# Patient Record
Sex: Female | Born: 1935 | Race: Black or African American | Hispanic: No | Smoking: Former smoker
Health system: Southern US, Community
[De-identification: ages and names within clinical notes are randomized; demographics above are authoritative.]

## PROBLEM LIST (undated history)

## (undated) DIAGNOSIS — J449 Chronic obstructive pulmonary disease, unspecified: Secondary | ICD-10-CM

## (undated) DIAGNOSIS — F0390 Unspecified dementia without behavioral disturbance: Secondary | ICD-10-CM

## (undated) DIAGNOSIS — K219 Gastro-esophageal reflux disease without esophagitis: Secondary | ICD-10-CM

## (undated) DIAGNOSIS — I10 Essential (primary) hypertension: Secondary | ICD-10-CM

## (undated) DIAGNOSIS — E785 Hyperlipidemia, unspecified: Secondary | ICD-10-CM

## (undated) DIAGNOSIS — J45909 Unspecified asthma, uncomplicated: Secondary | ICD-10-CM

## (undated) DIAGNOSIS — M159 Polyosteoarthritis, unspecified: Secondary | ICD-10-CM

## (undated) DIAGNOSIS — F329 Major depressive disorder, single episode, unspecified: Secondary | ICD-10-CM

## (undated) DIAGNOSIS — E039 Hypothyroidism, unspecified: Secondary | ICD-10-CM

## (undated) DIAGNOSIS — D249 Benign neoplasm of unspecified breast: Secondary | ICD-10-CM

## (undated) DIAGNOSIS — R5383 Other fatigue: Secondary | ICD-10-CM

## (undated) DIAGNOSIS — Z9889 Other specified postprocedural states: Secondary | ICD-10-CM

## (undated) DIAGNOSIS — F419 Anxiety disorder, unspecified: Secondary | ICD-10-CM

## (undated) DIAGNOSIS — M549 Dorsalgia, unspecified: Secondary | ICD-10-CM

## (undated) DIAGNOSIS — N816 Rectocele: Secondary | ICD-10-CM

## (undated) DIAGNOSIS — N3281 Overactive bladder: Secondary | ICD-10-CM

## (undated) DIAGNOSIS — I119 Hypertensive heart disease without heart failure: Secondary | ICD-10-CM

## (undated) DIAGNOSIS — I2510 Atherosclerotic heart disease of native coronary artery without angina pectoris: Secondary | ICD-10-CM

## (undated) DIAGNOSIS — M1990 Unspecified osteoarthritis, unspecified site: Secondary | ICD-10-CM

## (undated) DIAGNOSIS — G4700 Insomnia, unspecified: Secondary | ICD-10-CM

## (undated) HISTORY — DX: Atherosclerotic heart disease of native coronary artery without angina pectoris: I25.10

## (undated) HISTORY — DX: Chronic obstructive pulmonary disease, unspecified: J44.9

## (undated) HISTORY — DX: Polyosteoarthritis, unspecified: M15.9

## (undated) HISTORY — DX: Gastro-esophageal reflux disease without esophagitis: K21.9

## (undated) HISTORY — DX: Major depressive disorder, single episode, unspecified: F32.9

## (undated) HISTORY — DX: Rectocele: N81.6

## (undated) HISTORY — PX: ANKLE SURGERY: SHX546

## (undated) HISTORY — DX: Essential (primary) hypertension: I10

## (undated) HISTORY — DX: Unspecified osteoarthritis, unspecified site: M19.90

## (undated) HISTORY — PX: ROTATOR CUFF REPAIR: SHX139

## (undated) HISTORY — DX: Anxiety disorder, unspecified: F41.9

## (undated) HISTORY — DX: Overactive bladder: N32.81

## (undated) HISTORY — PX: SHOULDER SURGERY: SHX246

## (undated) HISTORY — DX: Unspecified dementia, unspecified severity, without behavioral disturbance, psychotic disturbance, mood disturbance, and anxiety: F03.90

## (undated) HISTORY — DX: Benign neoplasm of unspecified breast: D24.9

## (undated) HISTORY — DX: Insomnia, unspecified: G47.00

## (undated) HISTORY — DX: Dorsalgia, unspecified: M54.9

## (undated) HISTORY — DX: Other fatigue: R53.83

## (undated) HISTORY — DX: Hyperlipidemia, unspecified: E78.5

## (undated) HISTORY — DX: Hypertensive heart disease without heart failure: I11.9

## (undated) HISTORY — PX: APPENDECTOMY: SHX54

## (undated) HISTORY — DX: Unspecified asthma, uncomplicated: J45.909

## (undated) HISTORY — DX: Hypothyroidism, unspecified: E03.9

## (undated) HISTORY — DX: Other specified postprocedural states: Z98.890

---

## 1992-11-23 HISTORY — PX: ABDOMINAL HYSTERECTOMY: SHX81

## 1994-11-23 HISTORY — PX: BREAST BIOPSY: SHX20

## 1999-05-23 ENCOUNTER — Ambulatory Visit (HOSPITAL_COMMUNITY): Admission: RE | Admit: 1999-05-23 | Discharge: 1999-05-23 | Payer: Self-pay | Admitting: Cardiology

## 2001-05-20 ENCOUNTER — Ambulatory Visit (HOSPITAL_COMMUNITY): Admission: RE | Admit: 2001-05-20 | Discharge: 2001-05-20 | Payer: Self-pay | Admitting: General Surgery

## 2001-05-20 ENCOUNTER — Encounter: Payer: Self-pay | Admitting: General Surgery

## 2001-05-25 ENCOUNTER — Encounter: Payer: Self-pay | Admitting: General Surgery

## 2001-05-25 ENCOUNTER — Ambulatory Visit (HOSPITAL_COMMUNITY): Admission: RE | Admit: 2001-05-25 | Discharge: 2001-05-25 | Payer: Self-pay | Admitting: General Surgery

## 2001-06-29 ENCOUNTER — Ambulatory Visit (HOSPITAL_COMMUNITY): Admission: RE | Admit: 2001-06-29 | Discharge: 2001-06-29 | Payer: Self-pay | Admitting: Internal Medicine

## 2001-09-26 ENCOUNTER — Emergency Department (HOSPITAL_COMMUNITY): Admission: EM | Admit: 2001-09-26 | Discharge: 2001-09-26 | Payer: Self-pay | Admitting: Emergency Medicine

## 2001-11-24 ENCOUNTER — Ambulatory Visit (HOSPITAL_COMMUNITY): Admission: RE | Admit: 2001-11-24 | Discharge: 2001-11-24 | Payer: Self-pay | Admitting: General Surgery

## 2001-11-24 ENCOUNTER — Encounter: Payer: Self-pay | Admitting: General Surgery

## 2002-02-16 ENCOUNTER — Encounter: Payer: Self-pay | Admitting: General Surgery

## 2002-02-16 ENCOUNTER — Ambulatory Visit (HOSPITAL_COMMUNITY): Admission: RE | Admit: 2002-02-16 | Discharge: 2002-02-16 | Payer: Self-pay | Admitting: General Surgery

## 2002-05-23 ENCOUNTER — Ambulatory Visit (HOSPITAL_COMMUNITY): Admission: RE | Admit: 2002-05-23 | Discharge: 2002-05-23 | Payer: Self-pay | Admitting: General Surgery

## 2002-05-23 ENCOUNTER — Encounter: Payer: Self-pay | Admitting: General Surgery

## 2003-02-19 ENCOUNTER — Emergency Department (HOSPITAL_COMMUNITY): Admission: EM | Admit: 2003-02-19 | Discharge: 2003-02-19 | Payer: Self-pay | Admitting: Internal Medicine

## 2003-02-19 ENCOUNTER — Encounter: Payer: Self-pay | Admitting: Internal Medicine

## 2003-02-27 ENCOUNTER — Ambulatory Visit (HOSPITAL_COMMUNITY): Admission: RE | Admit: 2003-02-27 | Discharge: 2003-02-27 | Payer: Self-pay | Admitting: Orthopaedic Surgery

## 2003-02-27 ENCOUNTER — Encounter: Payer: Self-pay | Admitting: Orthopaedic Surgery

## 2003-07-09 ENCOUNTER — Encounter: Payer: Self-pay | Admitting: General Surgery

## 2003-07-09 ENCOUNTER — Ambulatory Visit (HOSPITAL_COMMUNITY): Admission: RE | Admit: 2003-07-09 | Discharge: 2003-07-09 | Payer: Self-pay | Admitting: General Surgery

## 2003-08-21 ENCOUNTER — Ambulatory Visit (HOSPITAL_COMMUNITY): Admission: RE | Admit: 2003-08-21 | Discharge: 2003-08-21 | Payer: Self-pay | Admitting: Internal Medicine

## 2003-08-21 HISTORY — PX: ESOPHAGOGASTRODUODENOSCOPY: SHX1529

## 2003-09-26 ENCOUNTER — Ambulatory Visit (HOSPITAL_COMMUNITY): Admission: RE | Admit: 2003-09-26 | Discharge: 2003-09-26 | Payer: Self-pay | Admitting: Internal Medicine

## 2004-07-15 ENCOUNTER — Ambulatory Visit (HOSPITAL_COMMUNITY): Admission: RE | Admit: 2004-07-15 | Discharge: 2004-07-15 | Payer: Self-pay | Admitting: Pulmonary Disease

## 2004-08-20 ENCOUNTER — Ambulatory Visit (HOSPITAL_COMMUNITY): Admission: RE | Admit: 2004-08-20 | Discharge: 2004-08-20 | Payer: Self-pay | Admitting: Pulmonary Disease

## 2004-10-20 ENCOUNTER — Ambulatory Visit: Payer: Self-pay | Admitting: Orthopedic Surgery

## 2004-12-18 ENCOUNTER — Ambulatory Visit: Payer: Self-pay | Admitting: Orthopedic Surgery

## 2004-12-31 ENCOUNTER — Ambulatory Visit: Payer: Self-pay | Admitting: Internal Medicine

## 2005-01-14 ENCOUNTER — Ambulatory Visit: Payer: Self-pay | Admitting: Orthopedic Surgery

## 2005-02-11 ENCOUNTER — Ambulatory Visit (HOSPITAL_COMMUNITY): Admission: RE | Admit: 2005-02-11 | Discharge: 2005-02-11 | Payer: Self-pay | Admitting: General Surgery

## 2005-05-27 ENCOUNTER — Ambulatory Visit: Payer: Self-pay | Admitting: Gastroenterology

## 2005-05-27 ENCOUNTER — Inpatient Hospital Stay (HOSPITAL_COMMUNITY): Admission: EM | Admit: 2005-05-27 | Discharge: 2005-06-01 | Payer: Self-pay | Admitting: Emergency Medicine

## 2005-08-19 ENCOUNTER — Ambulatory Visit (HOSPITAL_COMMUNITY): Admission: RE | Admit: 2005-08-19 | Discharge: 2005-08-19 | Payer: Self-pay | Admitting: General Surgery

## 2005-11-02 ENCOUNTER — Ambulatory Visit (HOSPITAL_COMMUNITY): Admission: RE | Admit: 2005-11-02 | Discharge: 2005-11-02 | Payer: Self-pay | Admitting: Pulmonary Disease

## 2005-11-04 ENCOUNTER — Ambulatory Visit: Payer: Self-pay | Admitting: Orthopedic Surgery

## 2005-11-09 ENCOUNTER — Ambulatory Visit (HOSPITAL_COMMUNITY): Admission: RE | Admit: 2005-11-09 | Discharge: 2005-11-09 | Payer: Self-pay | Admitting: Orthopedic Surgery

## 2005-12-03 ENCOUNTER — Ambulatory Visit: Payer: Self-pay | Admitting: Orthopedic Surgery

## 2006-07-05 ENCOUNTER — Ambulatory Visit: Payer: Self-pay | Admitting: Orthopedic Surgery

## 2006-07-21 ENCOUNTER — Ambulatory Visit: Payer: Self-pay | Admitting: Orthopedic Surgery

## 2006-08-05 ENCOUNTER — Encounter: Admission: RE | Admit: 2006-08-05 | Discharge: 2006-08-05 | Payer: Self-pay | Admitting: Orthopedic Surgery

## 2006-08-25 ENCOUNTER — Encounter: Admission: RE | Admit: 2006-08-25 | Discharge: 2006-08-25 | Payer: Self-pay | Admitting: Orthopedic Surgery

## 2006-08-25 ENCOUNTER — Ambulatory Visit (HOSPITAL_COMMUNITY): Admission: RE | Admit: 2006-08-25 | Discharge: 2006-08-25 | Payer: Self-pay | Admitting: General Surgery

## 2006-09-15 ENCOUNTER — Encounter: Admission: RE | Admit: 2006-09-15 | Discharge: 2006-09-15 | Payer: Self-pay | Admitting: Orthopedic Surgery

## 2006-09-29 ENCOUNTER — Ambulatory Visit: Payer: Self-pay | Admitting: Orthopedic Surgery

## 2007-01-10 ENCOUNTER — Ambulatory Visit: Payer: Self-pay | Admitting: Internal Medicine

## 2007-03-08 ENCOUNTER — Ambulatory Visit: Payer: Self-pay | Admitting: Orthopedic Surgery

## 2007-04-06 ENCOUNTER — Ambulatory Visit: Payer: Self-pay | Admitting: Orthopedic Surgery

## 2007-05-05 ENCOUNTER — Ambulatory Visit: Payer: Self-pay | Admitting: Orthopedic Surgery

## 2007-05-25 ENCOUNTER — Ambulatory Visit (HOSPITAL_COMMUNITY): Admission: RE | Admit: 2007-05-25 | Discharge: 2007-05-25 | Payer: Self-pay | Admitting: Ophthalmology

## 2007-06-08 ENCOUNTER — Ambulatory Visit: Payer: Self-pay | Admitting: Orthopedic Surgery

## 2007-06-29 DIAGNOSIS — J45909 Unspecified asthma, uncomplicated: Secondary | ICD-10-CM | POA: Insufficient documentation

## 2007-07-06 ENCOUNTER — Ambulatory Visit (HOSPITAL_COMMUNITY): Admission: RE | Admit: 2007-07-06 | Discharge: 2007-07-06 | Payer: Self-pay | Admitting: Ophthalmology

## 2007-08-29 ENCOUNTER — Ambulatory Visit (HOSPITAL_COMMUNITY): Admission: RE | Admit: 2007-08-29 | Discharge: 2007-08-29 | Payer: Self-pay | Admitting: General Surgery

## 2008-03-08 ENCOUNTER — Ambulatory Visit: Payer: Self-pay | Admitting: Cardiovascular Disease

## 2008-03-14 ENCOUNTER — Ambulatory Visit: Payer: Self-pay | Admitting: Cardiovascular Disease

## 2008-03-14 ENCOUNTER — Encounter: Payer: Self-pay | Admitting: Cardiovascular Disease

## 2008-03-14 ENCOUNTER — Encounter (HOSPITAL_COMMUNITY): Admission: RE | Admit: 2008-03-14 | Discharge: 2008-04-13 | Payer: Self-pay | Admitting: Cardiovascular Disease

## 2008-08-03 ENCOUNTER — Ambulatory Visit (HOSPITAL_COMMUNITY): Admission: RE | Admit: 2008-08-03 | Discharge: 2008-08-03 | Payer: Self-pay | Admitting: Pulmonary Disease

## 2008-08-15 ENCOUNTER — Encounter: Admission: RE | Admit: 2008-08-15 | Discharge: 2008-08-15 | Payer: Self-pay | Admitting: Pulmonary Disease

## 2008-08-30 ENCOUNTER — Encounter: Admission: RE | Admit: 2008-08-30 | Discharge: 2008-08-30 | Payer: Self-pay | Admitting: Pulmonary Disease

## 2008-08-31 ENCOUNTER — Ambulatory Visit (HOSPITAL_COMMUNITY): Admission: RE | Admit: 2008-08-31 | Discharge: 2008-08-31 | Payer: Self-pay | Admitting: General Surgery

## 2008-10-29 ENCOUNTER — Encounter: Admission: RE | Admit: 2008-10-29 | Discharge: 2008-10-29 | Payer: Self-pay | Admitting: Pulmonary Disease

## 2008-11-02 ENCOUNTER — Ambulatory Visit (HOSPITAL_COMMUNITY): Admission: RE | Admit: 2008-11-02 | Discharge: 2008-11-02 | Payer: Self-pay | Admitting: Pulmonary Disease

## 2009-01-18 ENCOUNTER — Ambulatory Visit: Payer: Self-pay | Admitting: Internal Medicine

## 2009-07-08 ENCOUNTER — Encounter: Payer: Self-pay | Admitting: Internal Medicine

## 2009-08-15 ENCOUNTER — Ambulatory Visit (HOSPITAL_COMMUNITY)
Admission: RE | Admit: 2009-08-15 | Discharge: 2009-08-15 | Payer: Self-pay | Admitting: Physical Medicine and Rehabilitation

## 2009-09-02 ENCOUNTER — Ambulatory Visit (HOSPITAL_COMMUNITY): Admission: RE | Admit: 2009-09-02 | Discharge: 2009-09-02 | Payer: Self-pay | Admitting: General Surgery

## 2009-12-13 ENCOUNTER — Encounter (INDEPENDENT_AMBULATORY_CARE_PROVIDER_SITE_OTHER): Payer: Self-pay | Admitting: *Deleted

## 2010-01-01 DIAGNOSIS — Z8679 Personal history of other diseases of the circulatory system: Secondary | ICD-10-CM | POA: Insufficient documentation

## 2010-01-01 DIAGNOSIS — R1310 Dysphagia, unspecified: Secondary | ICD-10-CM | POA: Insufficient documentation

## 2010-01-01 DIAGNOSIS — R32 Unspecified urinary incontinence: Secondary | ICD-10-CM | POA: Insufficient documentation

## 2010-01-01 DIAGNOSIS — I10 Essential (primary) hypertension: Secondary | ICD-10-CM | POA: Insufficient documentation

## 2010-01-01 DIAGNOSIS — J449 Chronic obstructive pulmonary disease, unspecified: Secondary | ICD-10-CM | POA: Insufficient documentation

## 2010-01-01 DIAGNOSIS — J4489 Other specified chronic obstructive pulmonary disease: Secondary | ICD-10-CM | POA: Insufficient documentation

## 2010-01-01 DIAGNOSIS — K219 Gastro-esophageal reflux disease without esophagitis: Secondary | ICD-10-CM | POA: Insufficient documentation

## 2010-01-02 ENCOUNTER — Ambulatory Visit: Payer: Self-pay | Admitting: Internal Medicine

## 2010-01-03 DIAGNOSIS — R143 Flatulence: Secondary | ICD-10-CM

## 2010-01-03 DIAGNOSIS — R141 Gas pain: Secondary | ICD-10-CM | POA: Insufficient documentation

## 2010-01-03 DIAGNOSIS — R142 Eructation: Secondary | ICD-10-CM

## 2010-09-05 ENCOUNTER — Ambulatory Visit (HOSPITAL_COMMUNITY): Admission: RE | Admit: 2010-09-05 | Discharge: 2010-09-05 | Payer: Self-pay | Admitting: General Surgery

## 2010-12-23 ENCOUNTER — Encounter (INDEPENDENT_AMBULATORY_CARE_PROVIDER_SITE_OTHER): Payer: Self-pay | Admitting: *Deleted

## 2010-12-23 NOTE — Assessment & Plan Note (Signed)
Summary: FU OV 1 YR,GERD,FECAL SEEPAGE/AMS   Visit Type:  Follow-up Visit Primary Care Provider:  Luan Pulling  Chief Complaint:  F/U  GERD/FECAL SEEPAGE.  History of Present Illness: 77 year old lady with history of gas/bloat symptoms and GERD along with some intermittent fecal seepage returns for one-year followup. She's been taking Metamucil wafers daily to almost every day over the past one year. This is essentially abolished her fecal seepage completely.  She has good regular bowel movements daily. Occasionally, she has some mild flatulence and belching.  More recently has been on omeprazole generic 20 mg related to control of her reflux symptoms. She is having no dysphagia. No melena no rectal bleeding. She was found to have internal hemorrhoids on a 02/16/01 colonoscopy ; for routine screening 02-17-2011. She's  down to 195 pounds from 212 when she weighed here one year ago,adopting a more healthy lifestyle. She denies any intercurrent hospitalizations or illnesses she is to see Dr. Luan Pulling next week   Current Medications (verified): 1)  Hydrocodone 10/500 .... As Needed 2)  Calcium Vit D 600 Mg .... Take 1 Tablet By Mouth Once A Day 3)  Mucinex .... As Needed 4)  Duo Neb .... As Needed 5)  Advair 500/50 .... As Directed Twice Daily 6)  Albuterol 17 Grams 7)  Advair Diskus 500-50 Mcg/dose Aepb (Fluticasone-Salmeterol) .... As Directed 8)  Azor 10-40 Mg Tabs (Amlodipine-Olmesartan) .... Once Daily 9)  Clarinex 5 Mg Tabs (Desloratadine) .... Once Daily As Needed 10)  Ranitidine Hcl 150 Mg Tabs (Ranitidine Hcl) .... Two Times A Day As Needed 11)  Theophylline Cr 200 Mg Xr12h-Tab (Theophylline) .... Take 1 Tablet By Mouth Two Times A Day 12)  Pravachol 40 Mg Tabs (Pravastatin Sodium) .... Take 1 Tablet By Mouth Once A Day 13)  Ditropan Xl 10 Mg Xr24h-Tab (Oxybutynin Chloride) .... Take 1 Tablet By Mouth Once A Day 14)  Omeprazole 20 Mg Cpdr (Omeprazole) .... Take 1 Tablet By Mouth Once A Day 15)   Protegra- Antioxidant .... Take 1 Tablet By Mouth Once A Day  Allergies (verified): 1)  ! Penicillin  Past History:  Past Medical History: Last updated: 06/29/2007 Asthma copd arthritis  Past Surgical History: Last updated: 01/01/2010 shoulder (rotator cup) ankle (rod & 13 screws) HYSTERECTOMY 1990s APPENDECTOMY ROTATOR  CUFF REPAIR BENIGN BREAST BIOPSY ANKLE SURGERY  Family History: Last updated: 01/03/10 Father:Deceased age 22  Kidney Failure  Mother: Deceased in 02/17/23  TB Siblings: One brother / deceased as child/ ? pneumonia  Social History: Last updated: 2010-01-03 Marital Status: Divorced Children: 5  Occupation: Retired/Disability  Family History: Father:Deceased age 28  Kidney Failure  Mother: Deceased in Feb 17, 2023  TB Siblings: One brother / deceased as child/ ? pneumonia  Social History: Marital Status: Divorced Children: 5  Occupation: Retired/Disability  Vital Signs:  Patient profile:   77 year old female Height:      62 inches Weight:      195 pounds BMI:     35.79 Temp:     98.2 degrees F oral Pulse rate:   60 / minute BP sitting:   160 / 82  (left arm) Cuff size:   regular  Vitals Entered By: Waldon Merl LPN (February 10, [id number] 10:22 AM)  Physical Exam  General:  very pleasant 77 year old lady resting comfortably Eyes:  no scleral icterus or conjunctival are pink Abdomen:  obese positive bowel sounds soft nontender without appreciable mass or hepatosplenomegaly  Impression & Recommendations: Impression: GERD well-controlled on omeprazole. Occasional gas  bloat with dietary indiscretion. On a balance, she's doing extremely well with a fiber supplement( psyllium). . She is due for average risk screening colonoscopy 2012.  Recommendations: Continue daily Metamucil ;we'll provide her with some samples of a probiotic to combat gas bloat.  Continue omeprazole daily  Office appointment here in one year to set up screening  colonoscopy.       Appended Document: Orders Update-charge    Clinical Lists Changes  Problems: Added new problem of ABDOMINAL BLOATING (ICD-787.3) Orders: Added new Service order of Est. Patient Level IV [id number]) - Signed

## 2010-12-23 NOTE — Letter (Signed)
Summary: Appointment Reminder  [facility]  [address]   [place], [place] [address]   Phone: [phone]  Fax: [phone]       December 13, 2009   KIMERLY DUMARS [address] [place], [place]  [address] 1934-03-30    Dear Ms. Gravely,  We have been unable to reach you by phone to schedule a follow up   appointment that was recommended for you by Dr. Gala Romney. It is very   important that we reach you to schedule an appointment. We hope that you  allow [place] to participate in your health care needs. Please contact us at  [phone] at your earliest convenience to schedule your appointment.  Sincerely,    [facility] R. Garfield Cornea, M.D.    Caro Hight, M.D. Vickey Huger, FNP-BC    Neil Crouch, PA-C Phone: [phone]    Fax: [phone]

## 2010-12-31 NOTE — Letter (Signed)
Summary: Recall Office Visit  [facility]  [address]   [place], [place] [address]   Phone: [phone]  Fax: [phone]      December 23, 2010   METTE LAROSE [address] [place],   [address] February 11, 1934   Dear Ms. Lewey,   According to our records, it is time for you to schedule a follow-up office visit with [place].   At your convenience, please call [phone] to schedule an office visit. If you have any questions, concerns, or feel that this letter is in error, we would appreciate your call.   Sincerely,    [facility] Ph: [phone]   Fax: [phone]

## 2011-01-09 ENCOUNTER — Encounter (INDEPENDENT_AMBULATORY_CARE_PROVIDER_SITE_OTHER): Payer: Self-pay

## 2011-01-14 NOTE — Letter (Signed)
Summary: Recall Colonoscopy/Endoscopy, Change to Office Visit  [facility]  [address]   [place], [place] [address]   Phone: [phone]  Fax: [phone]      January 09, 2011   Lauren [name] [address] [place], [place]  [address] 03-13-1934   Dear [name],   According to our records, it is time for you to schedule a Colonoscopy/Endoscopy. However, after reviewing your medical record, we recommend an office visit in order to determine your need for a repeat procedure.  Please call [phone] at your convenience to schedule an office visit. If you have any questions or concerns, please feel free to contact our office.   Sincerely,   Waldon Merl LPN  [facility] Ph: [phone]   Fax: [phone]

## 2011-01-19 ENCOUNTER — Encounter: Payer: Self-pay | Admitting: Internal Medicine

## 2011-01-19 ENCOUNTER — Encounter: Payer: Self-pay | Admitting: Gastroenterology

## 2011-01-19 ENCOUNTER — Ambulatory Visit (INDEPENDENT_AMBULATORY_CARE_PROVIDER_SITE_OTHER): Payer: MEDICARE | Admitting: Gastroenterology

## 2011-01-19 DIAGNOSIS — Z1211 Encounter for screening for malignant neoplasm of colon: Secondary | ICD-10-CM

## 2011-01-19 DIAGNOSIS — R109 Unspecified abdominal pain: Secondary | ICD-10-CM

## 2011-01-27 ENCOUNTER — Other Ambulatory Visit: Payer: Self-pay | Admitting: Internal Medicine

## 2011-01-27 ENCOUNTER — Encounter: Payer: MEDICARE | Admitting: Internal Medicine

## 2011-01-27 ENCOUNTER — Ambulatory Visit (HOSPITAL_COMMUNITY)
Admission: RE | Admit: 2011-01-27 | Discharge: 2011-01-27 | Disposition: A | Discharge status: Home / Self Care | Payer: MEDICARE | Source: Ambulatory Visit | Attending: Internal Medicine | Admitting: Internal Medicine

## 2011-01-27 DIAGNOSIS — Z79899 Other long term (current) drug therapy: Secondary | ICD-10-CM | POA: Insufficient documentation

## 2011-01-27 DIAGNOSIS — J4489 Other specified chronic obstructive pulmonary disease: Secondary | ICD-10-CM | POA: Insufficient documentation

## 2011-01-27 DIAGNOSIS — J449 Chronic obstructive pulmonary disease, unspecified: Secondary | ICD-10-CM | POA: Insufficient documentation

## 2011-01-27 DIAGNOSIS — Z1211 Encounter for screening for malignant neoplasm of colon: Secondary | ICD-10-CM | POA: Insufficient documentation

## 2011-01-27 DIAGNOSIS — D126 Benign neoplasm of colon, unspecified: Secondary | ICD-10-CM

## 2011-01-27 DIAGNOSIS — I10 Essential (primary) hypertension: Secondary | ICD-10-CM | POA: Insufficient documentation

## 2011-01-27 DIAGNOSIS — E785 Hyperlipidemia, unspecified: Secondary | ICD-10-CM | POA: Insufficient documentation

## 2011-01-27 DIAGNOSIS — K5730 Diverticulosis of large intestine without perforation or abscess without bleeding: Secondary | ICD-10-CM | POA: Insufficient documentation

## 2011-01-27 DIAGNOSIS — I82609 Acute embolism and thrombosis of unspecified veins of unspecified upper extremity: Secondary | ICD-10-CM

## 2011-01-27 HISTORY — PX: COLONOSCOPY: SHX174

## 2011-01-28 ENCOUNTER — Encounter: Payer: Self-pay | Admitting: Internal Medicine

## 2011-01-29 NOTE — Letter (Signed)
Summary: Internal Other  Internal Other   Imported By: Sherry Ruffing 01/19/2011 12:37:56  _____________________________________________________________________  External Attachment:    Type:   Image     Comment:   External Document

## 2011-01-29 NOTE — Assessment & Plan Note (Signed)
Summary: 1 YR F/U GERD/FECAL SEEPAGE/LAW   Vital Signs:  Patient profile:   77 year old female Height:      64 inches Weight:      191 pounds BMI:     32.90 Temp:     98.5 degrees F oral Pulse rate:   64 / minute BP sitting:   152 / 72  (left arm)  Vitals Entered By: Loney Loh LPN (February 27, [id number] 10:39 AM)   Visit Type:  Follow-up Visit Primary Care Provider:  Luan Pulling   History of Present Illness: Ms. Domico is a pleasant 77 year old African-American female who presents today in f/u for a screening colonoscopy. Last seen approximately one year ago. Last TCS was in 03-02-2001: internal hemorrhoids. She has a hx of reflux as well, but denies any exacerbation. Takes omeprazole daily. No dysphagia/odynophagia. She is trying to "watch her diet", and is down 3 lbs from last visit. Denies N/V. Does report occasional lower abdominal discomfort below umbilicus after eating, described as "tearing". Unsure if r/t types of food. Lasts only a short amount of time, then resolves. No associated change in BMs. Has BM daily, no melena or hematochezia.   Current Medications (verified): 1)  Calcium Vit D 600 Mg .... Take 1 Tablet By Mouth Once A Day 2)  Mucinex .... As Needed 3)  Duo Neb .... As Needed 4)  Advair Diskus 500-50 Mcg/dose Aepb (Fluticasone-Salmeterol) .... As Directed 5)  Azor 10-40 Mg Tabs (Amlodipine-Olmesartan) .... Once Daily 6)  Ranitidine Hcl 150 Mg Tabs (Ranitidine Hcl) .... Two Times A Day As Needed 7)  Ditropan Xl 10 Mg Xr24h-Tab (Oxybutynin Chloride) .... Take 1 Tablet By Mouth Once A Day 8)  Omeprazole 20 Mg Cpdr (Omeprazole) .... Take 1 Tablet By Mouth Once A Day 9)  Protegra- Antioxidant .... Take 1 Tablet By Mouth Once A Day 10)  Livalo 2 Mg Tabs (Pitavastatin Calcium) .... Take One Qd 11)  Alprazolam 0.25 Mg Tabs (Alprazolam) .... Take One Once Daily As Needed 12)  Lyrica 25 Mg Caps (Pregabalin) .... Take One Three Times A Day 13)  Metamucil 30.9 % Powd (Psyllium) ....  Take Two Times A Day  Allergies: 1)  ! Penicillin  Past History:  Past Medical History: Last updated: 06/29/2007 Asthma copd arthritis  Past Surgical History: Last updated: 01/01/2010 shoulder (rotator cup) ankle (rod & 13 screws) HYSTERECTOMY 1990s APPENDECTOMY ROTATOR  CUFF REPAIR BENIGN BREAST BIOPSY ANKLE SURGERY  Family History: Last updated: January 17, 2010 Father:Deceased age 59  Kidney Failure  Mother: Deceased in 2023-03-03  TB Siblings: One brother / deceased as child/ ? pneumonia  Social History: Last updated: 01-17-2010 Marital Status: Divorced Children: 5  Occupation: Retired/Disability  Review of Systems General:  Denies fever, chills, and anorexia. Eyes:  Denies blurring, irritation, and discharge. ENT:  Denies sore throat, hoarseness, and difficulty swallowing. CV:  Denies chest pains and syncope. Resp:  Denies dyspnea at rest and wheezing. GI:  See HPI. GU:  Denies urinary burning and urinary frequency. MS:  Denies joint pain / LOM, joint swelling, and joint stiffness. Derm:  Denies rash, itching, and dry skin. Neuro:  Denies weakness and syncope. Psych:  Denies depression and anxiety. Endo:  Denies cold intolerance and heat intolerance.  Physical Exam  General:  Well developed, well nourished, no acute distress. Head:  Normocephalic and atraumatic. Eyes:  PERRLA, no icterus. Mouth:  No deformity or lesions, dentition normal. Lungs:  Clear throughout to auscultation. Heart:  Regular rate and rhythm; no murmurs,  rubs,  or bruits. Abdomen:  +BS, obese, soft, non-distended, no HSM noted. small, easily reducible umbilical hernia. small amt of vague right-sided abdominal tenderness to palpation. No masses noted.  Msk:  Symmetrical with no gross deformities. Normal posture. Pulses:  Normal pulses noted. Extremities:  No clubbing, cyanosis, edema or deformities noted. Neurologic:  Alert and  oriented x4;  grossly normal neurologically. Skin:  Intact  without significant lesions or rashes. Psych:  Alert and cooperative. Normal mood and affect.   Impression & Recommendations:  Problem # 1:  ABDOMINAL PAIN, UNSPECIFIED SITE (ICD-42.54)  77 year old pleasant African-American female with last colonoscopy in 2002 showing internal hemorrhoids, due for average risk screening. BM daily, no melena or hematochezia. does report vague lower abdominal discomfort like "tearing" occasionally after eating. No associated change in BM. On physical exam, no tenderness in lower abdomen, but vague right-sided tenderness to palpation. question component of constipation. doubt hepatobiliary component. Will set up for TCS with Dr. Gala Romney.   TCS with Dr. Gala Romney in near future: the R/B/A have been discussed in detail; pt states understanding and desires to proceed. Consider CT scan if TCS benign.   Orders: Est. Patient Level II [id number])  Problem # 2:  SCREENING COLORECTAL-CANCER (ICD-V76.51)  see # 1.   Orders: Est. Patient Level II [id number])   Orders Added: 1)  Est. Patient Level II [id number]

## 2011-01-29 NOTE — Letter (Signed)
Summary: TCS ORDER  TCS ORDER   Imported By: Sofie Rower 01/19/2011 12:18:34  _____________________________________________________________________  External Attachment:    Type:   Image     Comment:   External Document

## 2011-02-02 NOTE — Op Note (Signed)
  NAME:  Lauren [name], Lauren [name]              ACCOUNT NO.:  [id number]  MEDICAL RECORD NO.:  [id number]           PATIENT TYPE:  O  LOCATION:  DAYP                          FACILITY:  [facility]  PHYSICIAN:  R. Garfield Cornea, M.D. DATE OF BIRTH:  Sep 24, 1934  DATE OF PROCEDURE:  01/27/2011 DATE OF DISCHARGE:                              OPERATIVE REPORT   PROCEDURE:  Colonoscopy with polyp ablation, multiple snare polypectomies.  INDICATIONS FOR PROCEDURE:  A 77 year old lady who comes for screening colonoscopy.  Last one was approximately 10 years ago, with essentially negative findings at that time.  Risks, benefits, alternatives, limitations, and imponderables have been reviewed, questions answered. Please see the documentation in the medical record.  PROCEDURE NOTE:  O2 saturation, blood pressure, pulse, respirations were monitored throughout the entire procedure.  CONSCIOUS SEDATION:  Versed 3 mg IV, Demerol 75 mg IV in divided doses.  INSTRUMENT:  Pentax video chip system.  FINDINGS:  Digital rectal exam revealed no abnormalities.  Endoscopic findings:  Prep was adequate.  Colon:  Colonic mucosa was surveyed from the rectosigmoid junction through the left transverse right colon to the appendiceal orifice, ileocecal valve/cecum.  These structures were well seen and photographed for the record.  From this level, scope was slowly and cautiously withdrawn.  All previously mentioned mucosal surfaces were again seen.  The patient had sigmoid diverticula.  The patient had multiple 5-mm to 9-mm polyps noted throughout her ascending segment cecum, descending, and splenic flexure.  Multiple hot and cold snare polypectomies were performed, diminutive polyps in the cecum, and ascending segment were ablated with the tip of hot snare cautery unit. Multiple specimens retrieved for the pathologist.  Otherwise colonic mucosa appeared normal.  Scope was pulled down to the rectum where a thorough  examination of rectal mucosa including retroflexed view of the anal verge demonstrated no abnormalities.  The patient tolerated the procedure well.  Cecal withdrawal time 13 minutes.  IMPRESSION: 1. Normal rectum. 2. Sigmoid diverticula. 3. Multiple colonic polyps removed via snare technique and/or ablated     as described above.  RECOMMENDATIONS: 1. Diverticulosis polyp literature provided to [name]. 2. Follow up on path. 3. Further recommendations to follow.     Bridgette Habermann, M.D.     RMR/MEDQ  D:  01/27/2011  T:  01/27/2011  Job:  [id number]  cc:   Percell Miller L. Luan Pulling, M.D. [id number]  Electronically Signed by Jannette Spanner M.D. on 02/02/2011 10:27:14 AM

## 2011-02-03 NOTE — Letter (Signed)
Summary: Patient Notice, Colon Biopsy Results  [facility]  [address]   [place], [place] [address]   Phone: [phone]  Fax: [phone]       January 28, 2011   Lauren [name] [address] [place], [place]  [address] 09-19-1934    Dear Ms. Ruhland,  I am pleased to inform you that the biopsies taken during your recent colonoscopy did not show any evidence of cancer upon pathologic examination.  Additional information/recommendations:  No further action is needed at this time.  Please follow-up with your primary care physician for your other healthcare needs.  You should have a repeat colonoscopy examination  in 5 years.  Please call us if you are having persistent problems or have questions about your condition that have not been fully answered at this time.  Sincerely,    R. Garfield Cornea MD, [facility] Ph: [phone]    Fax: [phone]   Appended Document: Patient Notice, Colon Biopsy Results Letter mailed to pt.   Appended Document: Patient Notice, Colon Biopsy Results PT IS AWARE

## 2011-04-07 NOTE — Assessment & Plan Note (Signed)
Lauren [name], Lauren [name]               CHART#:  [id number]   DATE:  01/18/2009                       DOB:  02/10/1934   Follow-up gastroesophageal reflux disease.  History of diverticulosis on  2002 screening colonoscopy.  Prior barium pill esophagram demonstrates  some sluggish motility but no obstruction to passage of the barium pill.  Prior EGD back in 2004 demonstrated normal esophageal mucosa and a 54-  [place] dilator was passed empirically.  She had some nonspecific  gastric erosions at that time.  She has a new problem of intermittent  soiling of her underclothes, she does not have any out and out  incontinence but has trouble wiping after she has a bowel movement.  She  goes 1-3 times daily, tries to take a fiber fortified diet.  She does  not take a fiber supplement.  She has not had any melena or rectal  bleeding.  No family history of polyps or colon cancer.  She is due for  routine screening colonoscopy in 2012.   CURRENT MEDICATIONS:  See updated list.   ALLERGIES:  Penicillin.   EXAM:  GENERAL:  Today, looks well.  VITAL SIGNS:  Weight 212, height 5 feet 4-1/2 inches, temperature 98.3,  BP 140/90, pulse 72.  SKIN:  Warm and dry.  ABDOMEN:  Obese.  Positive bowel sounds.  Soft, nontender.  No  appreciable mass or organomegaly.  EXTREMITIES:  No edema.  RECTAL:  Digital exam reveals modest sphincter tone and really no  increase in tone with voluntary squeeze, no mass in the rectal vault,  scant brown stool is Hemoccult negative.   IMPRESSION:  1. Gastroesophageal reflux disease symptoms.  Well controlled on      Nexium 40 mg orally daily.  2. Dysphagia.  Resolved, as long as she takes time to swallow her      food.   RECOMMENDATIONS:  1. Continue Nexium 40 grams orally daily.  2. Intermittent fecal seepage.  Really, no alarm symptoms at this      time.  I would like to see her bulk up her diet significantly and      not worry about dietary intake so much  with meals.  Would just go      ahead and give her a substantial fiber supplement in the way of      psyllium (i.e., Metamucil wafers 2 daily with 8 ounces of water).      I have given her a 2-week supply of samples; this should help bulk      her up and facilitate improved complete bowel movements with less      soiling.  Hopefully this will work and she can stay on this      regimen.  She should take Metamucil every day.  If this does not      work, she is going to let me know.  Otherwise,      will plan to see this nice lady back in 1 year.  Will plan to      perform screening colonoscopy in 2012.       Bridgette Habermann, M.D.  Electronically Signed     RMR/MEDQ  D:  01/18/2009  T:  01/18/2009  Job:  [id number]   cc:   Dr. Luan Pulling

## 2011-04-07 NOTE — Assessment & Plan Note (Signed)
[name] OFFICE NOTE   Lauren [name], Lauren [name]                     MRN:          [id number]  DATE:03/08/2008                            DOB:          22-Sep-1934    Lauren [name] is a pleasant 77 year old patient referred for palpitations,  shortness of breath, fatigue and chest heaviness.  The patient has seen  Dr. Verl Blalock in the past in 2000.  She had a normal cath at that time.  She  has no documented coronary disease.  No evidence of decreased LV  function.  She has been fatigued for about a month.  She was recently  started on antibiotics for a question of a URI.  She tells me she has  not had any lab work.   CORONARY RISK FACTORS:  Include primarily hyperlipidemia.   The patient also has had a heaviness in her chest.  It is not  necessarily exertional.  It can occur in the morning when she wakes up.  There is no associated nausea or reflux.  The heaviness waxes and wanes  and has been associated with her fatigue.  She also has a history of  COPD on theophylline.  She has mild chronic exertional dyspnea and this  really has not changed.  She is fairly sedentary but does all activities  of daily living.   Her palpitations are infrequent.  She can notice them maybe once or  twice a week and they are not long runs and she has had no syncope.   She has been on her Crestor for quite some time and does not relate this  to any muscle pain or weakness.   REVIEW OF SYSTEMS:  Otherwise are negative.   PAST MEDICAL HISTORY:  Is remarkable for COPD, hyperlipidemia, recent  treatment with cefuroxime for URI, history of reflux on Nexium,  hypertension on Azor and previous hysterectomy, previous appendectomy,  previous right ankle surgery, previous left rotator cuff surgery,  previous right breast biopsy for cyst and question of a left-sided cyst  on the abdominal area.   ALLERGIES:  The patient denies any  allergies.   MEDICATIONS:  1. Her meds include Crestor 10 a day.  2. Xyzal 5 mg daily at bedtime.  3. Cefuroxime 250 b.i.d.  4. Nexium 40 a day.  5. Ranitidine 150 b.i.d.  6. Theophylline 200 b.i.d.  7. Azor 5/40.  8. Clarinex.  9. Glucosamine.  10.Advair 500/50 b.i.d.   FAMILY HISTORY:  Is noncontributory.   SOCIAL HISTORY:  She has been divorced for a long time.  She has 3 kids  who live in [place].  She sees her sister-in-law a lot.  She likes to  cook and is otherwise sedentary.  She does all activities of daily  living, does not drink or smoke.   PHYSICAL EXAMINATION:  GENERAL:  Her exam is remarkable for an elderly  black female in no distress.  VITAL SIGNS:  Her blood pressure is 130/82, pulse is 60 with occasional  PACs, respiratory rate 14, afebrile.  HEENT:  Unremarkable.  Carotids are normal without bruit.  No  lymphadenopathy,  no thyromegaly, no JVP elevation.  LUNGS:  Clear with good diaphragmatic motion and no wheezing.  HEART:  S1-S2 normal heart sounds.  PMI normal.  ABDOMEN:  Benign, status post hysterectomy and appendectomy.  No AAA, no  tenderness.  No bruit.  No hepatosplenomegaly or hepatojugular reflux.  Distal pulses intact.  No edema.  NEURO:  Nonfocal.  SKIN:  Warm and dry.  No muscular weakness.  She has scars over the  lateral aspect of the right ankle and in the left rotator cuff area.   EKG shows sinus rhythm with occasional PACs.   IMPRESSION:  1. Fatigue.  Needs basic lab work.  Will check BMET, fasting lipid      profile, LFTs, sed rate, aminophylline level, TSH and T4.  Doubt      that it is related to heart.  2. Dyspnea, likely more related to her COPD (chronic obstructive      pulmonary disease) on aminophylline.  Baseline PFTs per Dr.      Luan Pulling, check 2-D echocardiogram, no obvious murmur.  3. PACs (premature atrial contractions) with relative bradycardia.  I      do not think this is bad enough to relate to her symptoms.  At  some      point in the future she may need an outpatient monitor, avoid beta-      blockers for now.  4. Hypertension, currently well-controlled.  Continue Azor.  5. Hyperlipidemia on Crestor 10 a day.  Will check her lab work today.  6. COPD (chronic obstructive pulmonary disease).  Continue Advair and      theophylline.  Follow up with Dr. Luan Pulling.  7. Chest heaviness, may be more related to reflux.  Continue Nexium.      Will have an adenosine Myoview study since it has been quite some      time since she has had any evaluation of coronary disease.  Further      recommendations will be based on the results of      her Myoview and echo but I do not see that there is a cardiac      etiology to her multiple symptoms.     Wallis Bamberg. Johnsie Cancel, MD, [facility]  Electronically Signed    PCN/MedQ  DD: 03/08/2008  DT: 03/08/2008  Job #: [id number]   cc:   Percell Miller L. Luan Pulling, M.D.

## 2011-04-10 NOTE — H&P (Signed)
NAMETAUNDRA, LEGLEITER              ACCOUNT NO.:  [id number]   MEDICAL RECORD NO.:  [id number]          PATIENT TYPE:  INP   LOCATION:  [id number]                          FACILITY:  [facility]   PHYSICIAN:  Edward L. Luan Pulling, M.D.DATE OF BIRTH:  October 06, 1934   DATE OF ADMISSION:  05/27/2005  DATE OF DISCHARGE:  LH                                HISTORY & PHYSICAL   ADDENDUM:   PAST MEDICAL HISTORY:  Positive for coronary disease for which she has very  significantly atypical symptoms. She has hypertension, severe COPD.   SOCIAL HISTORY:  She is an ex-cigarette smoker. She lives at home alone. She  does not drink any alcohol.   FAMILY HISTORY:  Positive for COPD as well in multiple family members.       ELH/MEDQ  D:  05/29/2005  T:  05/29/2005  Job:  [id number]

## 2011-04-10 NOTE — Group Therapy Note (Signed)
NAMEEUNIQUA, KALISTA              ACCOUNT NO.:  [id number]   MEDICAL RECORD NO.:  [id number]          PATIENT TYPE:  INP   LOCATION:  A331                           FACILITY:   PHYSICIAN:  Estill Bamberg. Karie Kirks, M.D.DATE OF BIRTH:  1934/07/10   DATE OF PROCEDURE:  05/31/2005  DATE OF DISCHARGE:                                   PROGRESS NOTE   SUBJECTIVE:  She is feeling better.  She is still having a little bit of  diarrhea but this has improved and she has minimal lower abdominal pain.  She has not eaten a full meal yet, however.   OBJECTIVE:  She is sitting up in bed, having her breathing treatment.  She  is oriented, alert, in no acute distress.  A well-developed, well-nourished  female.  Temperature 98.4, pulse 60, respiratory rate 20, blood pressure  157/81.  The lungs appear clear throughout.  The heart has a regular rhythm  with a rate of 70.  The abdomen is soft, mildly overweight without  hepatosplenomegaly or mass but some mild lower abdominal tenderness.  There  is no edema of the ankles.   O2 saturation is 94% on room air.   ASSESSMENT:  1.  A three weeks history of diarrhea probably secondary to Clostridium      difficile despite negative stool tests.  2.  Chronic obstructive pulmonary disease.   PLAN:  1.  Continue the metronidazole 250 mg q.i.d.  2.  If she tolerates food,  possibly will be able to go home tomorrow.       SDK/MEDQ  D:  05/31/2005  T:  05/31/2005  Job:  [id number]

## 2011-04-10 NOTE — H&P (Signed)
NAME:  Lauren [name], Lauren [name] NO.:  [id number]   MEDICAL RECORD NO.:  [id number]                  PATIENT TYPE:   LOCATION:                                       FACILITY:   PHYSICIAN:  R. Garfield Cornea, M.D.              DATE OF BIRTH:   DATE OF ADMISSION:  DATE OF DISCHARGE:                                HISTORY & PHYSICAL   CHIEF COMPLAINT:  Dysphasia for two-three months.   SUBJECTIVE:  Ms. Lauren [name] is a 77 year old African American female who  presents to our office complaining of both liquid and solid food dysphasia  for approximately three months.  She is also complaining of odynophagia and  feeling like trying to get things down with difficulty.  She has had a  history of many years of GERD.  She currently has nausea.  However, she  denies any emesis or regurgitation.  She denies any abdominal pain, melena,  history of peptic ulcer disease, or bright red blood per rectum.  She has  been on a Napro-Pack for approximately three months.  Her weight has  remained stable and there is no family history of colorectal carcinoma.   PAST MEDICAL HISTORY:  1. COPD.  2. Hypertension.  3. Asthma.  4. Urinary incontinence.  5. Angina.  6. Hysterectomy (1990s).  7. Appendectomy.  8. Rotator cuff repair.  9. Benign breast biopsy.  10.      Ankle surgery.   CURRENT MEDICATIONS:  1. Advair Diskus b.i.d.  2. Theo-Dur 300 mg b.i.d.  3. Lescol XL 80 mg daily.  4. Detrol LA 4 mg daily.  5. Ogen 1.25 mg daily.  6. Isoptin SR 240 mg daily.  7. Napro-Pack p.r.n.  8. DuoNeb p.r.n.  9. Guaifenesin p.r.n.   ALLERGIES:  No known drug allergies.   FAMILY HISTORY:  There is no known family history of colorectal carcinoma,  liver carcinoma, or GI problems.  Mother is deceased in her 42s secondary to  tuberculosis and father deceased at age 80 secondary to kidney failure.  There is a family history of diabetes mellitus.   SOCIAL HISTORY:  She is  currently divorced and lives alone.  She has three  grown daughters and one grown son with good heath except hypertension.  She  is currently retired from a distribution center.  She does report a one pack  per day smoking history for 20 years, which approximately quit 30+ years  ago.  She denies any alcohol or drug use.   REVIEW OF SYSTEMS:  CONSTITUTIONAL:  Weight is up 5-10 pounds in the last  month secondary to increased caloric intake per patient.  She denies any  fever or chills.  SKIN:  She denies any jaundice or rash.  CARDIOVASCULAR:  She does report some substernal pain and does have a history of angina.  She  is followed by Dr. Verl Blalock in cardiology.  She does  report that the pain  discussed in HPI is in no way similar to her anginal pains.  HEENT:  She  does report some vocal changes over the past several months and decreased  strength in her voice.  PULMONARY:  She denies any shortness of breath or  cough.  EXTREMITIES:  She denies any swelling.   PHYSICAL EXAMINATION:  VITAL SIGNS:  Weight is 206.5 pounds, height 65  inches, temperature 97.7, blood pressure 140/88, pulse 76.  GENERAL:  Lauren [name] is a 77 year old African American well-developed well-  nourished female in no acute distress.  HEENT:  Sclerae clear, nonicteric.  Conjunctivae pink.  Oropharynx pink and  moist without any lesions.  NECK:  Supple.  No thyromegaly or mass.  HEART:  Regular rate and rhythm without murmurs, rubs, or gallops.  LUNGS:  Emphysematous changes bilaterally.  ABDOMEN:  Positive right lower quadrant scarring well healed secondary to  appendectomy.  Also hysterectomy scars are noted.  Positive bowel sounds in  all four.  Nontender, nondistended.  No organomegaly.  No rebound  tenderness.  Negative Murphy's sign.  EXTREMITIES:  2+ pedal pulses bilaterally.  No pedal edema.  SKIN:  Warm, dry, and intact without any rash or jaundice.   ASSESSMENT:  Lauren [name] is 77 year old female with both  solid and liquid  food dysphasia for approximately three months:  I discussed and felt it  would be advisable or proceed with esophagogastroduodenoscopy with possible  esophageal dilation as necessary, as she may have developed stricture given  her chronic gastroesophageal reflux disease.  Further evaluation to rule out  lesions is advisable as well, although this does not seem likely given her  history.  I discussed esophagogastroduodenoscopy procedure as well as the  risks and benefits to include but not limited to bleeding, perforation, and  infection.  She agrees with this plan.  Consent will be obtained.   RECOMMENDATIONS:  1. She is to continue Prevacid 30 mg daily.  2.     I will scheduled EGD with possible ED by Dr. Gala Romney.  3. We will follow up pending these results.   We would like to thank Dr. Luan Pulling for this kind referral.     _____________________________________  ___________________________________________  Les Pou, N.P.               Bridgette Habermann, M.D.   KC/MEDQ  D:  08/17/2003  T:  08/17/2003  Job:  [id number]   cc:   Percell Miller L. Luan Pulling, M.D.  [place]  [place] [address]  Fax: [phone]

## 2011-04-10 NOTE — Group Therapy Note (Signed)
Lauren [name], Lauren [name]              ACCOUNT NO.:  [id number]   MEDICAL RECORD NO.:  [id number]          PATIENT TYPE:  INP   LOCATION:  [id number]                          FACILITY:  [facility]   PHYSICIAN:  Edward L. Luan Pulling, M.D.DATE OF BIRTH:  28-May-1934   DATE OF PROCEDURE:  05/29/2005  DATE OF DISCHARGE:                                   PROGRESS NOTE   PROBLEM:  Abdominal discomfort, nausea, vomiting, diarrhea.   SUBJECTIVE:  Lauren [name] seems to be still having problems. Yesterday, she  appeared to be getting better, but this morning she is having more  discomfort in her abdomen. She is having more diarrhea.   OBJECTIVE:  VITAL SIGNS:  Her physical examination shows that her  temperature is 98.5, pulse 66, respirations 20, blood pressure 105/62, O2  saturation is 97%.  CHEST:  Her chest is actually fairly clear.  HEART:  Heart is regular.  ABDOMEN:  Her abdomen is soft.  EXTREMITIES:  Extremities showed no edema.   ASSESSMENT/PLAN:  Her clostridium difficile is negative, but she certainly  continues to have difficulty. No white blood cells in her stool. I have told  her I think I will go ahead and ask for help from the GI team simply to see  if there is anything else we can do to make her more comfortable. With  clostridium difficile being negative, this may simply be a severe  gastroenteritis.       ELH/MEDQ  D:  05/29/2005  T:  05/29/2005  Job:  [id number]

## 2011-04-10 NOTE — Discharge Summary (Signed)
[name], Lauren              ACCOUNT NO.:  [id number]   MEDICAL RECORD NO.:  [id number]          PATIENT TYPE:  INP   LOCATION:  [id number]                          FACILITY:  [facility]   PHYSICIAN:  Edward L. Luan Pulling, M.D.DATE OF BIRTH:  10/31/1934   DATE OF ADMISSION:  05/27/2005  DATE OF DISCHARGE:  07/10/2006LH                                 DISCHARGE SUMMARY   FINAL DISCHARGE DIAGNOSES:  1.  Acute diarrheal illness probably pseudomembranous colitis.  2.  Chronic obstructive pulmonary disease.  3.  Hypertension.  4.  Coronary artery occlusive disease.  5.  Dehydration.   HISTORY:  [name] is a 77 year old who was admitted to the hospital on  July 5 with a 36-hour history of nausea, vomiting, and abdominal pain.  She  had diarrhea.  She had been treated with an antibiotic for an upper  respiratory illness about 2 or 3 weeks ago.  When she was seen in the  emergency room she had low-grade fever but then it came up to 103, she had  white blood count of 20,000, her abdomen was minimally tender.  She had  mildly diffuse tenderness.  The rest of her exam showed that she appeared to  be mildly dehydrated and she was febrile as mentioned, chest was clear,  heart was regular, abdomen soft, extremities showed no edema.   HOSPITAL COURSE:  It was thought initially that she might have a viral  infection but with an elevated white blood cell count and a fever she was  worked up for Clostridium difficile.  Workup was negative as far as stools  were concerned but because she did not improve she had a consultation with  the GI team who felt that even with the negative stool this was likely  Clostridium difficile disease.  She was treated with Flagyl and improved  over the next several days, was discharged home in much improved condition  to continue with her previous medications and add Flagyl 250 mg t.i.d. x7  days and then re-evaluate.  She has been on Advair 250/50.  She is going to  continue Advair 250/50 one puff b.i.d., Theo-Dur 200 mg b.i.d., Lescol XL 80  daily, Detrol LA daily, Prevacid 30 mg daily, nebulizer with albuterol and  Atrovent four times a day and verapamil 240 mg daily.       ELH/MEDQ  D:  06/01/2005  T:  06/01/2005  Job:  [id number]

## 2011-04-10 NOTE — Group Therapy Note (Signed)
Lauren [name], Lauren [name]              ACCOUNT NO.:  [id number]   MEDICAL RECORD NO.:  [id number]          PATIENT TYPE:  INP   LOCATION:  A331                          FACILITY:  [facility]   PHYSICIAN:  Estill Bamberg. Karie Kirks, M.D.DATE OF BIRTH:  [birth year]   DATE OF PROCEDURE:  05/30/2005  DATE OF DISCHARGE:                                   PROGRESS NOTE   SUBJECTIVE:  The patient is feeling better. Diarrhea has improved somewhat.   OBJECTIVE:  GENERAL:  She is sitting up in bed. She is oriented, alert, well  developed, somewhat overweight in no acute distress.  LUNGS:  Her lungs are clear throughout.  HEART:  Regular rhythm and rate of about 70.  ABDOMEN:  Soft without hepatosplenomegaly or mass or tenderness today.  VITAL SIGNS:  O2 saturation on room air is 93 and 94. Temperature is 98.4,  pulse 61, respiratory rate 20, blood pressure 110/64.   ASSESSMENT:  1.  Chronic diarrhea which may be due to a clostridium difficile despite a      negative lab test for this.  2.  Chronic obstructive pulmonary disease.  3.  Remote history of cigarette smoking.   PLAN:  Reviewed her records. Continue her albuterol ipratropium nebulizer  treatments with metronidazole 250 mg q.i.d. and enoxaparin 40 mg q.d. subcu.  Discussed medical problems with patient and her daughter. Follow up with Dr.  Laural Golden of GI today.       SDK/MEDQ  D:  05/30/2005  T:  05/30/2005  Job:  [id number]

## 2011-04-10 NOTE — H&P (Signed)
NAMECAMRY, CASTLEMAN              ACCOUNT NO.:  [id number]   MEDICAL RECORD NO.:  [id number]          PATIENT TYPE:  INP   LOCATION:  [id number]                          FACILITY:  [facility]   PHYSICIAN:  Edward L. Luan Pulling, M.D.DATE OF BIRTH:  Dec 02, 1933   DATE OF ADMISSION:  05/27/2005  DATE OF DISCHARGE:  LH                                HISTORY & PHYSICAL   HISTORY OF PRESENT ILLNESS:  Ms. Phoebus is admitted nausea, vomiting,  diarrhea.  She says this started about 36 hours prior to admission and at  that time she had abdominal pain, which was cramping in nature.  She then  developed nausea, vomited multiple times, had diarrhea multiple times. She  did have some fever.  She had been in my office about two weeks ago and had  been placed on an antibiotic so there is a concern that this could represent  Clostridium difficile.  She came to the emergency room and was evaluated and  treated but was not able to be discharged because of increasing problems  with continued nausea, vomiting, diarrhea.   PHYSICAL EXAMINATION:  GENERAL APPEARANCE:  Her examination shows a well-  nourished, well-developed female who is in no acute distress.  She does seem  to be in some mild abdominal discomfort.  VITAL SIGNS:  Blood pressure 130/70. Pulse 80.  Temperature is 100.  CHEST:  Fairly clear.  HEART:  Regular.  ABDOMEN:  Soft.  Shows mild tenderness diffusely and hyperactive bowel  sounds.   ASSESSMENT:  Abdominal discomfort of unknown cause, possibly a Clostridium  difficile.   PLAN:  Will go ahead with treatment, medications, etc. and check stools for  Clostridium difficile.  Continue other medications and follow her.       ELH/MEDQ  D:  05/28/2005  T:  05/28/2005  Job:  [id number]

## 2011-04-10 NOTE — Consult Note (Signed)
NAMETIMECA, VALIDO              ACCOUNT NO.:  [id number]   MEDICAL RECORD NO.:  [id number]          PATIENT TYPE:  INP   LOCATION:  [id number]                          FACILITY:  [facility]   PHYSICIAN:  Hildred Laser, M.D.    DATE OF BIRTH:  08/05/1934   DATE OF CONSULTATION:  DATE OF DISCHARGE:                                   CONSULTATION   DATE OF CONSULTATION:  May 29, 2005.   REQUESTING PHYSICIAN:  Edward L. Luan Pulling, M.D.   REASON FOR CONSULTATION:  Nausea, vomiting, diarrhea, abdominal pain.   HISTORY OF PRESENT ILLNESS:  The patient is a 77 year old black female who  was admitted to the hospital on May 27, 2005 for an approximately 36-hour  history of nausea, vomiting and diarrhea.  It is notable that she had been  on antibiotics for a respiratory illness approximately 2-3 weeks ago.  Upon  presentation, she had a low-grade temperature.  The temperature rose to 103  early in her admission.  Her white count was 20,000 with 95% segmented  neutrophils.  She was started on IV Cipro by the on-call physician.  Thus  far she has had one negative C. diff toxin titer and negative fecal WBC.  Her blood cultures remained negative at 24 hours.  She has not had any noted  improvement and continues to have watery stools.  She is having four to five  a day.  Nausea and vomiting have ceased.  She has now developed lower  abdominal pain over the last 24 hours.  She is currently afebrile.   MEDICATIONS PRIOR TO ADMISSION:  The patient states she is on Advair, Theo-  Dur, Lescol, Detrol LA, a blood pressure pill, possibly Prevacid or other  PPI, and nebulizers.   ALLERGIES:  No known drug allergies.   PAST MEDICAL HISTORY:  COPD, hypertension, asthma, urinary incontinence,  angina, status post hysterectomy in the 1990s, appendectomy, rotator cuff  repair, benign breast biopsy and ankle surgery.  EGD in September, 2004  revealed multiple linear antral erosions.  Esophagus was dilated with 54  French Maloney dilator, given history of dysphagia.   FAMILY HISTORY:  Negative for colorectal cancer, liver disease or GI  problems.  The mother died in her 49s due to TB.  The father died in his 97s  secondary to kidney failure.   SOCIAL HISTORY:  She is currently divorced and lives alone.  She has three  granddaughters and one grandson.  She is retired from the distribution  center.  She reports one pack per day smoking history for 20 years but quit  30 years ago.  No alcohol or drug use.   REVIEW OF SYSTEMS:  See HPI for GI.  Denies any hematemesis, melena or  rectal bleeding.  CONSTITUTIONAL:  Denies any weight loss.  CARDIOPULMONARY:  Denies chest pain or shortness of breath.   PHYSICAL EXAMINATION:  VITAL SIGNS:  T max over the last 24 hours 99, T  currently 98.5, pulse 66, respirations 20, blood pressure 105/62.  GENERAL:  Pleasant, obese, elderly black female who appears to be in mild  distress.  SKIN:  Warm and dry, no jaundice.  HEENT:  Conjunctivae are pink.  Sclerae are nonicteric.  Oropharyngeal  mucosa moist and pink.  No lesions, erythema or exudate.  No lymphadenopathy  or thyromegaly.  CHEST:  Lungs are clear to auscultation.  CARDIAC:  Reveals regular rate and rhythm.  No murmurs, rubs or gallops.  ABDOMEN:  Positive bowel sounds.  Soft, nondistended.  She has diffuse  abdominal tenderness, particularly on the left abdomen to deep palpation.  No organomegaly or masses are appreciated but limited due to the patient's  cooperation.  No rebound tenderness noted.  Subjective guarding.  EXTREMITIES:  No edema.   LABORATORY:  White count 20,100, hemoglobin 14, hematocrit 41.3, platelets  186,000.  Sodium 134, potassium 3.3, BUN 20, creatinine 1.4, glucose 182.  That is all on May 27, 2005.  She had an abdominal film which revealed no  bowel obstruction.   IMPRESSION:  The patient is a 77 year old female who was admitted with  nausea, vomiting, diarrhea, abdominal  pain, fever, leukocytosis.  Given  recent antibiotic use, there is high suspicion for C. diff colitis.   RECOMMENDATIONS:  1.  We will discontinue Cipro after current dose.  2.  Begin Flagyl 250 mg p.o. q.i.d.  3.  Follow up with CBC, Met 7 and CT.  4.  Clear liquid diet for now.  5.  Further recommendations to follow.       LL/MEDQ  D:  05/29/2005  T:  05/29/2005  Job:  [id number]   cc:   Percell Miller L. Luan Pulling, M.D.  [place]  [place] [address]  Fax: [phone]

## 2011-04-10 NOTE — Op Note (Signed)
NAME:  Lauren [name], Lauren [name]                        ACCOUNT NO.:  [id number]   MEDICAL RECORD NO.:  [id number]                   PATIENT TYPE:  AMB   LOCATION:  DAY                                  FACILITY:  [facility]   PHYSICIAN:  R. Garfield Cornea, M.D.              DATE OF BIRTH:  May 08, 1934   DATE OF PROCEDURE:  DATE OF DISCHARGE:                                 OPERATIVE REPORT   PROCEDURE:  Esophagogastroduodenoscopy with Maloney dilation.   ENDOSCOPIST:  Cristopher Estimable. Rourk, M.D.   INDICATIONS FOR PROCEDURE:  The patient is a 77 year old lady with  esophageal dysphagia for 3 months.  EGD is now being done to further  evaluate her symptoms.  This approach has been discussed with the patient at  length.  The potential risks, benefits, and alternatives have been reviewed;  questions answered.  Please see my dictated consultation note for more  information.   PROCEDURE NOTE:  O2 saturation, blood pressure, pulse and respirations were  monitored throughout the entire procedure.  Conscious sedation: Versed 3 mg  IV, Demerol 75 mg IV in divided doses.   INSTRUMENT:  Olympus video chip gastroscope.   FINDINGS:  Examination of the tubular esophagus revealed no mucosal  abnormalities.  The EG junction was easily traversed.   STOMACH:  The gastric cavity was empty.  It insufflated well with air.  A  thorough examination of the gastric mucosa including a retroflex view of the  proximal stomach and esophagogastric junction demonstrated multiple linear  antral erosions.  The pylorus was patent and easily traversed.  The  remainder of the gastric mucosa appeared normal.   DUODENUM:  The bulb and the second portion appeared normal.   THERAPEUTIC/DIAGNOSTIC MANEUVERS:  A 54 French Maloney dilator was passed to  full insertion with good patient tolerance and without blood return on the  dilator.  A look back revealed no apparent complication related to the  passage of the dilator. The patient  tolerated the procedure well and was  reacted in endoscopy.   IMPRESSION:  1. Normal tubular esophagus status post passage of Maloney dilator as     described above.  2. Multiple linear antral erosions of uncertain significance, could be NSAID     effect.  The remainder of the gastric mucosa appeared normal.  Patent     pylorus, normal D1 and D2.   RECOMMENDATIONS:  1. In addition to taking Napro Pak I have asked her to take a second dose of     Prevacid 30 mg daily to make it a     b.i.d. regimen for the time being.  2. We will check H. pylori serologies.  3. We will make further recommendations.      ___________________________________________  Bridgette Habermann, M.D.   RMR/MEDQ  D:  08/21/2003  T:  08/21/2003  Job:  [id number]   cc:   Percell Miller L. Luan Pulling, M.D.  [place]  [place] [address]  Fax: [phone]

## 2011-08-17 ENCOUNTER — Other Ambulatory Visit (HOSPITAL_COMMUNITY): Payer: Self-pay | Admitting: General Surgery

## 2011-08-17 DIAGNOSIS — Z139 Encounter for screening, unspecified: Secondary | ICD-10-CM

## 2011-09-07 LAB — BASIC METABOLIC PANEL
BUN: 11
CO2: 27
Calcium: 8.9
Chloride: 100
Creatinine, Ser: 0.61
GFR calc Af Amer: >60
GFR calc non Af Amer: >60
Glucose, Bld: 138 — ABNORMAL HIGH
Potassium: 3.8
Sodium: 134 — ABNORMAL LOW

## 2011-09-07 LAB — HEMOGLOBIN AND HEMATOCRIT, BLOOD
HCT: 37.6
Hemoglobin: 12.5

## 2011-09-08 ENCOUNTER — Ambulatory Visit (HOSPITAL_COMMUNITY)
Admission: RE | Admit: 2011-09-08 | Discharge: 2011-09-08 | Disposition: A | Discharge status: Home / Self Care | Payer: Medicare Other | Source: Ambulatory Visit | Attending: General Surgery | Admitting: General Surgery

## 2011-09-08 DIAGNOSIS — Z139 Encounter for screening, unspecified: Secondary | ICD-10-CM

## 2011-09-08 DIAGNOSIS — Z1231 Encounter for screening mammogram for malignant neoplasm of breast: Secondary | ICD-10-CM | POA: Insufficient documentation

## 2011-09-09 LAB — BASIC METABOLIC PANEL
BUN: 13
CO2: 28
Calcium: 9.6
Chloride: 106
Creatinine, Ser: 0.65
GFR calc Af Amer: >60
GFR calc non Af Amer: >60
Glucose, Bld: 84
Potassium: 4
Sodium: 142

## 2011-09-09 LAB — HEMOGLOBIN AND HEMATOCRIT, BLOOD
HCT: 34 — ABNORMAL LOW
Hemoglobin: 11.3 — ABNORMAL LOW

## 2011-09-17 ENCOUNTER — Encounter (HOSPITAL_COMMUNITY): Payer: Self-pay | Admitting: Emergency Medicine

## 2011-09-17 ENCOUNTER — Emergency Department (HOSPITAL_COMMUNITY): Payer: Medicare Other

## 2011-09-17 ENCOUNTER — Emergency Department (HOSPITAL_COMMUNITY)
Admission: EM | Admit: 2011-09-17 | Discharge: 2011-09-17 | Disposition: A | Discharge status: Home / Self Care | Payer: Medicare Other | Attending: Emergency Medicine | Admitting: Emergency Medicine

## 2011-09-17 DIAGNOSIS — S46909A Unspecified injury of unspecified muscle, fascia and tendon at shoulder and upper arm level, unspecified arm, initial encounter: Secondary | ICD-10-CM | POA: Insufficient documentation

## 2011-09-17 DIAGNOSIS — S4992XA Unspecified injury of left shoulder and upper arm, initial encounter: Secondary | ICD-10-CM

## 2011-09-17 DIAGNOSIS — M25519 Pain in unspecified shoulder: Secondary | ICD-10-CM | POA: Insufficient documentation

## 2011-09-17 DIAGNOSIS — Z79899 Other long term (current) drug therapy: Secondary | ICD-10-CM | POA: Insufficient documentation

## 2011-09-17 DIAGNOSIS — W108XXA Fall (on) (from) other stairs and steps, initial encounter: Secondary | ICD-10-CM | POA: Insufficient documentation

## 2011-09-17 DIAGNOSIS — S4980XA Other specified injuries of shoulder and upper arm, unspecified arm, initial encounter: Secondary | ICD-10-CM | POA: Insufficient documentation

## 2011-09-17 MED ORDER — IBUPROFEN 600 MG PO TABS: 600.0000 mg | ORAL_TABLET | Freq: Four times a day (QID) | ORAL | Status: AC | PRN

## 2011-09-17 MED ORDER — KETOROLAC TROMETHAMINE 30 MG/ML IJ SOLN
30.0000 mg | Freq: Once | INTRAMUSCULAR | Status: AC
  Administered 2011-09-17: 30 mg via INTRAMUSCULAR
  Filled 2011-09-17: qty 1

## 2011-09-17 NOTE — ED Provider Notes (Addendum)
History     CSN: [id number] Arrival date & time: 09/17/2011  7:19 AM   First MD Initiated Contact with Patient 09/17/11 [phone]      Chief Complaint  Patient presents with  . Shoulder Pain  . Fall    The history is provided by the patient.  On Sunday 10/21, patient fell on her shoulder while walking up her stairs. She tripped on the last step, fell, and landed on her left shoulder. Since then, she has been experiencing worsening pain of her left shoulder. Comes in today after not being able to sleep due to the pain. She describes the pain as a sharp, aching pain that is worse when she moves her left arm. Hydrocodone that she takes occasionally for her chronic back pain did not help the pain. She feels weak in that arm due to the pain. Denies numbness in that hand.  She had shoulder surgery for a rotator cuff injury a while back. She is not sure which shoulder was operated on.    Past Medical History  Diagnosis Date  . Asthma   . COPD (chronic obstructive pulmonary disease)   . Arthritis     Past Surgical History  Procedure Date  . Shoulder surgery     Rotator Cup  . Ankle surgery     Rod and 13 screws  . Vesicovaginal fistula closure w/ tah 90's  . Appendectomy   . Breast biopsy     Benign  . Abdominal hysterectomy     History reviewed. No pertinent family history.  History  Substance Use Topics  . Smoking status: Never Smoker   . Smokeless tobacco: Not on file  . Alcohol Use: No    OB History    Grav Para Term Preterm Abortions TAB SAB Ect Mult Living                  Review of Systems  Constitutional: Positive for activity change. Negative for fever, chills, diaphoresis and appetite change.  Respiratory: Negative for shortness of breath.   Cardiovascular: Negative for chest pain and palpitations.  Gastrointestinal: Negative for nausea, vomiting, abdominal pain, diarrhea and constipation.  Genitourinary: Negative for dysuria and difficulty urinating.    Musculoskeletal: Negative for gait problem.  Neurological: Negative for dizziness, seizures, light-headedness and headaches.    Allergies  Penicillins  Home Medications   Current Outpatient Rx  Name Route Sig Dispense Refill  . ALPRAZOLAM 0.25 MG PO TABS Oral Take 0.25 mg by mouth at bedtime as needed.      Marland Kitchen AMLODIPINE-OLMESARTAN 10-40 MG PO TABS Oral Take 1 tablet by mouth daily.      Marland Kitchen CALCIUM + D PO Oral Take by mouth.      Marland Kitchen FLUTICASONE-SALMETEROL 500-50 MCG/DOSE IN AEPB Inhalation Inhale 1 puff into the lungs every 12 (twelve) hours.      . GUAIFENESIN 600 MG PO TB12 Oral Take 1,200 mg by mouth 2 (two) times daily.      Marland Kitchen PROTEGRA PO Oral Take by mouth.      . OMEPRAZOLE 20 MG PO CPDR Oral Take 20 mg by mouth daily.      . OXYBUTYNIN CHLORIDE ER 10 MG PO TB24 Oral Take 10 mg by mouth daily.      Marland Kitchen PITAVASTATIN CALCIUM 2 MG PO TABS Oral Take by mouth.      . PSYLLIUM 30.9 % PO POWD Oral Take by mouth.      Marland Kitchen RANITIDINE HCL 150 MG PO CAPS  Oral Take 150 mg by mouth 2 (two) times daily.        BP 213/97  Pulse 67  Temp(Src) 98.4 F (36.9 C) (Oral)  Ht 5\' 4"  (1.626 m)  Wt 187 lb (84.823 kg)  BMI 32.10 kg/m2  SpO2 100%  Physical Exam  Constitutional: Distressed: Appears uncomfortable.  HENT:  Head: Normocephalic and atraumatic.  Neck: Normal range of motion. Neck supple.  Cardiovascular: Normal rate and regular rhythm.   Pulmonary/Chest: Effort normal. No respiratory distress.  Musculoskeletal:       Left shoulder: She exhibits decreased range of motion (Left shoulder: flexion 20 deg, abduction 30 deg, extension 10 deg. Right shoulder intact. ), tenderness (Along AC joint and bicipital groove. ), bony tenderness and decreased strength (of left shoulder due to pain but grip strength in left hand intact. Sensation intact. ). She exhibits no swelling, no effusion, no crepitus, no deformity, no laceration, no spasm and normal pulse.  Lymphadenopathy:    She has no cervical  adenopathy.  Skin: She is not diaphoretic.     Psychiatric: She has a normal mood and affect. Her speech is normal and behavior is normal. Judgment and thought content normal. Cognition and memory are normal.    ED Course  Procedures (including critical care time)  Labs Reviewed - No data to display Dg Shoulder Left  09/17/2011  *RADIOLOGY REPORT*  Clinical Data: 77 year old female with left shoulder pain following fall.  LEFT SHOULDER - 2+ VIEW  Comparison: None  Findings: There is no evidence of fracture, subluxation or dislocation. Mild degenerative changes at the glenohumeral and acromioclavicular joints are noted. No suspicious focal bony lesions are identified. The visualized bony left hemithorax is unremarkable.  IMPRESSION: No evidence of acute bony abnormality.  Mild degenerative changes.  Original Report Authenticated By: Lura Em, M.D.   No diagnosis found.    MDM  77 YO F presenting with left shoulder pain following accidental fall on that shoulder and presenting with likely rotator cuff injury.  Toradol helped her pain.  Discussed taking ibuprofen as needed for pain, stretching exercising to keep the shoulder mobile to prevent frozen shoulder, icing shoulder, and follow-up with PCP in 1 week for possible physical therapy and/or orthopedic referral.   [place] Resident 09/17/11 [place] Resident 09/17/11 [phone]

## 2011-09-17 NOTE — ED Notes (Signed)
Pt c/o left shoulder pain after falling Sunday.

## 2011-12-03 ENCOUNTER — Emergency Department (HOSPITAL_COMMUNITY): Payer: No Typology Code available for payment source

## 2011-12-03 ENCOUNTER — Emergency Department (HOSPITAL_COMMUNITY)
Admission: EM | Admit: 2011-12-03 | Discharge: 2011-12-03 | Disposition: A | Discharge status: Home / Self Care | Payer: No Typology Code available for payment source | Attending: Emergency Medicine | Admitting: Emergency Medicine

## 2011-12-03 ENCOUNTER — Encounter (HOSPITAL_COMMUNITY): Payer: Self-pay | Admitting: *Deleted

## 2011-12-03 DIAGNOSIS — R51 Headache: Secondary | ICD-10-CM | POA: Insufficient documentation

## 2011-12-03 DIAGNOSIS — S161XXA Strain of muscle, fascia and tendon at neck level, initial encounter: Secondary | ICD-10-CM

## 2011-12-03 DIAGNOSIS — M545 Low back pain, unspecified: Secondary | ICD-10-CM | POA: Insufficient documentation

## 2011-12-03 DIAGNOSIS — S335XXA Sprain of ligaments of lumbar spine, initial encounter: Secondary | ICD-10-CM | POA: Diagnosis not present

## 2011-12-03 DIAGNOSIS — Z9889 Other specified postprocedural states: Secondary | ICD-10-CM | POA: Insufficient documentation

## 2011-12-03 DIAGNOSIS — Z9079 Acquired absence of other genital organ(s): Secondary | ICD-10-CM | POA: Insufficient documentation

## 2011-12-03 DIAGNOSIS — S139XXA Sprain of joints and ligaments of unspecified parts of neck, initial encounter: Secondary | ICD-10-CM | POA: Insufficient documentation

## 2011-12-03 DIAGNOSIS — M129 Arthropathy, unspecified: Secondary | ICD-10-CM | POA: Insufficient documentation

## 2011-12-03 DIAGNOSIS — M542 Cervicalgia: Secondary | ICD-10-CM | POA: Insufficient documentation

## 2011-12-03 DIAGNOSIS — J449 Chronic obstructive pulmonary disease, unspecified: Secondary | ICD-10-CM | POA: Insufficient documentation

## 2011-12-03 DIAGNOSIS — I10 Essential (primary) hypertension: Secondary | ICD-10-CM | POA: Insufficient documentation

## 2011-12-03 DIAGNOSIS — S39012A Strain of muscle, fascia and tendon of lower back, initial encounter: Secondary | ICD-10-CM

## 2011-12-03 DIAGNOSIS — J4489 Other specified chronic obstructive pulmonary disease: Secondary | ICD-10-CM | POA: Insufficient documentation

## 2011-12-03 MED ORDER — HYDROCODONE-ACETAMINOPHEN 5-325 MG PO TABS
1.0000 | ORAL_TABLET | Freq: Once | ORAL | Status: AC
  Administered 2011-12-03: 1 via ORAL
  Filled 2011-12-03: qty 1
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
## 2011-12-03 NOTE — ED Notes (Signed)
Per Evalee Jefferson PA- " Ok to take off c collar"
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
## 2011-12-05 NOTE — ED Provider Notes (Signed)
Medical screening examination/treatment/procedure(s) were performed by non-physician practitioner and as supervising physician I was immediately available for consultation/collaboration.  Gypsy Balsam. Olin Hauser, MD 12/05/11 2221

## 2012-07-04 ENCOUNTER — Telehealth: Payer: Self-pay

## 2012-07-04 NOTE — Telephone Encounter (Signed)
Mary called and said her Mom was referred here for procedure. I checked with Dawn and she was referred for Difficulty swallowing. I scheduled her for OV with Neil Crouch, PA on 07/06/2012 at 8:30 AM.

## 2012-07-05 ENCOUNTER — Encounter: Payer: Self-pay | Admitting: Internal Medicine

## 2012-07-06 ENCOUNTER — Encounter: Payer: Self-pay | Admitting: Gastroenterology

## 2012-07-06 ENCOUNTER — Other Ambulatory Visit: Payer: Self-pay | Admitting: Internal Medicine

## 2012-07-06 ENCOUNTER — Ambulatory Visit (INDEPENDENT_AMBULATORY_CARE_PROVIDER_SITE_OTHER): Payer: Medicare Other | Admitting: Gastroenterology

## 2012-07-06 VITALS — BP 161/77 | HR 57 | Temp 98.1°F | Ht 65.0 in | Wt 178.4 lb

## 2012-07-06 DIAGNOSIS — R1310 Dysphagia, unspecified: Secondary | ICD-10-CM

## 2012-07-06 DIAGNOSIS — R109 Unspecified abdominal pain: Secondary | ICD-10-CM

## 2012-07-06 DIAGNOSIS — K429 Umbilical hernia without obstruction or gangrene: Secondary | ICD-10-CM | POA: Insufficient documentation

## 2012-07-06 DIAGNOSIS — K219 Gastro-esophageal reflux disease without esophagitis: Secondary | ICD-10-CM

## 2012-07-06 DIAGNOSIS — K5900 Constipation, unspecified: Secondary | ICD-10-CM

## 2012-07-06 DIAGNOSIS — R1319 Other dysphagia: Secondary | ICD-10-CM

## 2012-07-06 DIAGNOSIS — R1314 Dysphagia, pharyngoesophageal phase: Secondary | ICD-10-CM

## 2012-07-06 DIAGNOSIS — R1030 Lower abdominal pain, unspecified: Secondary | ICD-10-CM

## 2012-07-06 MED ORDER — POLYETHYLENE GLYCOL 3350 17 GM/SCOOP PO POWD: 17.0000 g | Freq: Every day | ORAL | Status: AC

## 2012-07-06 NOTE — Assessment & Plan Note (Signed)
Intermittent lower abdominal pain possibly related to adhesive disease or constipation. Patient denies dysuria. She reports having a total hysterectomy and appendectomy previously. She is also tender around the umbilicus and has a known umbilical hernia. This is easily reducible on exam today. Discussed with patient and her daughter today. We will better manage her constipation initially. If she continues to have lower abdominal discomfort at that point, would order CT of the abdomen pelvis with contrast. They are in agreement with the plan.

## 2012-07-06 NOTE — Progress Notes (Signed)
Primary Care Physician:  Alonza Bogus, MD  Primary Gastroenterologist:  Garfield Cornea, MD   Chief Complaint  Patient presents with  . Dysphagia    HPI:  Lauren [name] is a 78 y.o. female here for further evaluation of difficulty swallowing. Mostly with solid foods, odynophagia with solids. No heartburn. Not taking Prilosec because feels like does not need it. Dysphagia for 2-3 months. When swallowing water, feels like it wants to bubble up. Pills slow to go down.   Also worried about suprapubic tearing or burning intermittently. Going on for couple of months. Last for few minutes at a time. Still having difficulty getting out BM. Feels incomplete. No rectal bleeding, melena. Some small BM every day. No unintentional weight loss. Some weight loss with dietary changes.  She weighed 191 pounds in February 2012.  Current Outpatient Prescriptions  Medication Sig Dispense Refill  . ALPRAZolam (XANAX) 0.25 MG tablet Take 0.25 mg by mouth at bedtime as needed.        Marland Kitchen amLODipine-olmesartan (AZOR) 5-40 MG per tablet Take 1 tablet by mouth daily.      . Calcium Carbonate-Vitamin D (CALCIUM + D PO) Take by mouth.       . Fluticasone-Salmeterol (ADVAIR DISKUS) 500-50 MCG/DOSE AEPB Inhale 1 puff into the lungs every 12 (twelve) hours.       Marland Kitchen KRILL OIL 1000 MG CAPS Take 1,000 mg by mouth daily.      . Multiple Vitamins-Minerals (PROTEGRA PO) Take by mouth.       . oxybutynin (DITROPAN-XL) 10 MG 24 hr tablet Take 10 mg by mouth daily.        . Pitavastatin Calcium (LIVALO) 2 MG TABS Take by mouth.       . Psyllium (METAMUCIL) 30.9 % POWD Take 1 packet by mouth daily as needed. constipation      . guaiFENesin (MUCINEX) 600 MG 12 hr tablet Take 1,200 mg by mouth 2 (two) times daily.       Marland Kitchen omeprazole (PRILOSEC) 20 MG capsule Take 20 mg by mouth daily.          Allergies as of 07/06/2012 - Review Complete 07/06/2012  Allergen Reaction Noted  . Penicillins      Past Medical History    Diagnosis Date  . Asthma   . COPD (chronic obstructive pulmonary disease)   . Arthritis   . Hypertension   . Hyperlipidemia   . Back pain          Past Surgical History  Procedure Date  . Shoulder surgery     Rotator Cuff  . Ankle surgery     Rod and 13 screws  . Rotator cuff repair   . Appendectomy   . Breast biopsy 1996    Benign  . Abdominal hysterectomy 1994  . Colonoscopy 01/27/2011    Normal rectum/Sigmoid diverticula/Multiple colonic polyps removed (TUBULAR ADENOMA) Next TCS 01/2016  . Esophagogastroduodenoscopy 08/21/2003       Normal tubular esophagus status post passage of Maloney dilator as  described above/  2. Multiple linear antral erosions of uncertain significance, could be NSAID    Family History  Problem Relation Age of Onset  . Tuberculosis Mother 39    deceased  . Kidney failure Father 64  . Colon cancer Neg Hx     History   Social History  . Marital Status: Divorced    Spouse Name: N/A    Number of Children: 84  . Years of Education: N/A  Occupational History  .     Social History Main Topics  . Smoking status: Never Smoker   . Smokeless tobacco: Not on file  . Alcohol Use: No  . Drug Use: No  . Sexually Active:    Other Topics Concern  . Not on file   Social History Narrative  . No narrative on file      ROS:  General: Negative for anorexia, weight loss, fever, chills, fatigue, weakness. Eyes: Negative for vision changes.  ENT: Negative for hoarseness, difficulty swallowing , nasal congestion. CV: Negative for chest pain, angina, palpitations, dyspnea on exertion, peripheral edema. No chest pain recently. Respiratory: Negative for dyspnea at rest, dyspnea on exertion, cough, sputum, wheezing.  GI: See history of present illness. GU:  Negative for dysuria, hematuria, urinary incontinence, urinary frequency, nocturnal urination.  MS: Negative for joint pain, low back pain.  Derm: Negative for rash or itching.  Neuro:  Negative for weakness, abnormal sensation, seizure, frequent headaches, memory loss, confusion.  Psych: Negative for anxiety, depression, suicidal ideation, hallucinations. Recent stress but has improved Endo: Negative for unusual weight change.  Heme: Negative for bruising or bleeding. Allergy: Negative for rash or hives.    Physical Examination:  BP 161/77  Pulse 57  Temp 98.1 F (36.7 C) (Temporal)  Ht 5\' 5"  (1.651 m)  Wt 178 lb 6.4 oz (80.922 kg)  BMI 29.69 kg/m2   General: Well-nourished, well-developed in no acute distress. Accompanied by daughter. Head: Normocephalic, atraumatic.   Eyes: Conjunctiva pink, no icterus. Mouth: Oropharyngeal mucosa moist and pink , no lesions erythema or exudate. Neck: Supple without thyromegaly, masses, or lymphadenopathy.  Lungs: Clear to auscultation bilaterally.  Heart: Regular rate and rhythm, no murmurs rubs or gallops.  Abdomen: Bowel sounds are normal, nondistended, no hepatosplenomegaly or masses, no abdominal bruits. She has a small umbilical hernia easily reducible slightly tender. Mild tenderness over the suprapubic region. No rebound or guarding.   Rectal: not performed Extremities: No lower extremity edema. No clubbing or deformities.  Neuro: Alert and oriented x 4 , grossly normal neurologically.  Skin: Warm and dry, no rash or jaundice.   Psych: Alert and cooperative, normal mood and affect.   Imaging Studies: No results found.

## 2012-07-06 NOTE — Progress Notes (Signed)
Faxed to PCP

## 2012-07-06 NOTE — Patient Instructions (Signed)
1. We have scheduled you for an upper endoscopy with Dr. Gala Romney to further evaluate your swallowing difficulties. Please see separate instructions. 2. Please start MiraLax for your constipation. Take 1 cap full in the evening on days you do not have a good bowel movement. You may take daily if necessary. 3. Once your bowel movements are improved, if you continue to have lower abdominal pain please call me and we will schedule a CT scan of your abdomen.

## 2012-07-06 NOTE — Assessment & Plan Note (Signed)
Continue fiber supplement. Add MiraLax 17 g at bedtime as needed to have an adequate bowel movement.

## 2012-07-06 NOTE — Assessment & Plan Note (Signed)
Denies typical reflux symptoms. She has not been on her Prilosec in quite some time. She began having esophageal dysphagia about 2-3 months ago. Initially solid food but now more difficulty with liquids and pills. Recommend EGD with dilation in the near future.  I have discussed the risks, alternatives, benefits with regards to but not limited to the risk of reaction to medication, bleeding, infection, perforation and the patient is agreeable to proceed. Written consent to be obtained.  Based on findings, she may need to go back on PPI indefinitely.

## 2012-07-20 ENCOUNTER — Encounter (HOSPITAL_COMMUNITY): Payer: Self-pay | Admitting: Pharmacy Technician

## 2012-08-03 MED ORDER — SODIUM CHLORIDE 0.45 % IV SOLN
INTRAVENOUS | Status: DC
  Administered 2012-08-04: 1000 mL via INTRAVENOUS

## 2012-08-04 ENCOUNTER — Encounter (HOSPITAL_COMMUNITY)
Admission: RE | Disposition: A | Discharge status: Home Health | Payer: Self-pay | Source: Ambulatory Visit | Attending: Internal Medicine

## 2012-08-04 ENCOUNTER — Ambulatory Visit (HOSPITAL_COMMUNITY)
Admission: RE | Admit: 2012-08-04 | Discharge: 2012-08-04 | Disposition: A | Discharge status: Home Health | Payer: Medicare Other | Source: Ambulatory Visit | Attending: Internal Medicine | Admitting: Internal Medicine

## 2012-08-04 ENCOUNTER — Encounter (HOSPITAL_COMMUNITY): Payer: Self-pay | Admitting: *Deleted

## 2012-08-04 DIAGNOSIS — R1310 Dysphagia, unspecified: Secondary | ICD-10-CM | POA: Insufficient documentation

## 2012-08-04 DIAGNOSIS — K222 Esophageal obstruction: Secondary | ICD-10-CM

## 2012-08-04 DIAGNOSIS — K2960 Other gastritis without bleeding: Secondary | ICD-10-CM

## 2012-08-04 DIAGNOSIS — I10 Essential (primary) hypertension: Secondary | ICD-10-CM | POA: Insufficient documentation

## 2012-08-04 DIAGNOSIS — E785 Hyperlipidemia, unspecified: Secondary | ICD-10-CM | POA: Insufficient documentation

## 2012-08-04 DIAGNOSIS — K5900 Constipation, unspecified: Secondary | ICD-10-CM

## 2012-08-04 DIAGNOSIS — J4489 Other specified chronic obstructive pulmonary disease: Secondary | ICD-10-CM | POA: Insufficient documentation

## 2012-08-04 DIAGNOSIS — K219 Gastro-esophageal reflux disease without esophagitis: Secondary | ICD-10-CM

## 2012-08-04 DIAGNOSIS — K228 Other specified diseases of esophagus: Secondary | ICD-10-CM | POA: Insufficient documentation

## 2012-08-04 DIAGNOSIS — J449 Chronic obstructive pulmonary disease, unspecified: Secondary | ICD-10-CM | POA: Insufficient documentation

## 2012-08-04 DIAGNOSIS — K2289 Other specified disease of esophagus: Secondary | ICD-10-CM | POA: Insufficient documentation

## 2012-08-04 HISTORY — PX: ESOPHAGOGASTRODUODENOSCOPY: SHX5428

## 2012-08-04 HISTORY — PX: ESOPHAGEAL DILATION: SHX303

## 2012-08-04 SURGERY — EGD (ESOPHAGOGASTRODUODENOSCOPY)
Anesthesia: Moderate Sedation

## 2012-08-04 MED ORDER — BUTAMBEN-TETRACAINE-BENZOCAINE 2-2-14 % EX AERO
INHALATION_SPRAY | CUTANEOUS | Status: DC | PRN
  Administered 2012-08-04: 1 via TOPICAL

## 2012-08-04 MED ORDER — STERILE WATER FOR IRRIGATION IR SOLN
Status: DC | PRN
  Administered 2012-08-04: 09:00:00

## 2012-08-04 MED ORDER — MEPERIDINE HCL 100 MG/ML IJ SOLN
INTRAMUSCULAR | Status: DC | PRN
  Administered 2012-08-04: 25 mg via INTRAVENOUS

## 2012-08-04 MED ORDER — MIDAZOLAM HCL 5 MG/5ML IJ SOLN
INTRAMUSCULAR | Status: AC
  Filled 2012-08-04: qty 10

## 2012-08-04 MED ORDER — MEPERIDINE HCL 100 MG/ML IJ SOLN
INTRAMUSCULAR | Status: AC
  Filled 2012-08-04: qty 1

## 2012-08-04 MED ORDER — MIDAZOLAM HCL 5 MG/5ML IJ SOLN
INTRAMUSCULAR | Status: DC | PRN
  Administered 2012-08-04 (×2): 1 mg via INTRAVENOUS

## 2012-08-04 NOTE — Interval H&P Note (Signed)
History and Physical Interval Note:  08/04/2012 9:51 AM  Cruz Condon  has presented today for surgery, with the diagnosis of GERD, Dysphagia, Constipation  The various methods of treatment have been discussed with the patient and family. After consideration of risks, benefits and other options for treatment, the patient has consented to  Procedure(s) (LRB) with comments: ESOPHAGOGASTRODUODENOSCOPY (EGD) (N/A) - 7:30 ESOPHAGEAL DILATION () as a surgical intervention .  The patient's history has been reviewed, patient examined, no change in status, stable for surgery.  I have reviewed the patient's chart and labs.  Questions were answered to the patient's satisfaction.     Manus Rudd

## 2012-08-04 NOTE — Op Note (Signed)
[place], [id number]   ENDOSCOPY PROCEDURE REPORT  PATIENT: Lauren [name], Lauren [name].  MR#: [id number] BIRTHDATE: 01-Jun-1934 , 77  yrs. old GENDER: Female ENDOSCOPIST: R.  Garfield Cornea, MD FACP [facility] REFERRED BY:  Sinda Du, M.D. PROCEDURE DATE:  08/04/2012 PROCEDURE:     EGD with Venia Minks dilation  INDICATIONS:     Esophageal dysphagia  INFORMED CONSENT:   The risks, benefits, limitations, alternatives and imponderables have been discussed.  The potential for biopsy, esophogeal dilation, etc. have also been reviewed.  Questions have been answered.  All parties agreeable.  Please see the history and physical in the medical record for more information.  MEDICATIONS:   Versed 2 mg IV and Demerol 25 mg IV in divided doses. Cetacaine spray  DESCRIPTION OF PROCEDURE:   The [id number]  endoscope was introduced through the mouth and advanced to the second portion of the duodenum without difficulty or limitations.  The mucosal surfaces were surveyed very carefully during advancement of the scope and upon withdrawal.  Retroflexion view of the proximal stomach and esophagogastric junction was performed.      FINDINGS:  Noncritical Schatzki's ring. Somewhat baggy atonic esophagus. Mucosa, otherwise normal. Stomach empty. Couple tiny antral erosions. No ulcer or infiltrating process. Pylorus patent. The first and second portion of the duodenum appeared normal.   THERAPEUTIC / DIAGNOSTIC MANEUVERS PERFORMED:  A 56 French Maloney dilators passed to full insertion easily. A look back revealed or complication with this maneuver.   COMPLICATIONS:  None  IMPRESSION:  Noncritical Schatzki's ring. Baggy, atonic appearing esophagus-status post passage of a Maloney dilator. Few gastric erosions.  RECOMMENDATIONS:Continue omeprazole 20 mg orally daily. His dysphagia is not markedly improved with today's maneuver, need to consider the  possibility of a coexisting esophageal motility disorder.    _______________________________ R. Garfield Cornea, MD FACP [facility] eSigned:  R. Garfield Cornea, MD FACP [facility] 08/04/2012 9:47 AM     CC:

## 2012-08-04 NOTE — Interval H&P Note (Signed)
History and Physical Interval Note:  08/04/2012 9:14 AM  Lauren [name]  has presented today for surgery, with the diagnosis of GERD, Dysphagia, Constipation  The various methods of treatment have been discussed with the patient and family. After consideration of risks, benefits and other options for treatment, the patient has consented to  Procedure(s) (LRB) with comments: ESOPHAGOGASTRODUODENOSCOPY (EGD) (N/A) - 7:30 as a surgical intervention .  The patient's history has been reviewed, patient examined, no change in status, stable for surgery.  I have reviewed the patient's chart and labs.  Questions were answered to the patient's satisfaction.     Manus Rudd

## 2012-08-04 NOTE — Interval H&P Note (Signed)
History and Physical Interval Note:  08/04/2012 9:52 AM  Cruz Condon  has presented today for surgery, with the diagnosis of GERD, Dysphagia, Constipation  The various methods of treatment have been discussed with the patient and family. After consideration of risks, benefits and other options for treatment, the patient has consented to  Procedure(s) (LRB) with comments: ESOPHAGOGASTRODUODENOSCOPY (EGD) (N/A) - 7:30 ESOPHAGEAL DILATION () as a surgical intervention .  The patient's history has been reviewed, patient examined, no change in status, stable for surgery.  I have reviewed the patient's chart and labs.  Questions were answered to the patient's satisfaction.     Manus Rudd

## 2012-08-04 NOTE — H&P (View-Only) (Signed)
Primary Care Physician:  Alonza Bogus, MD  Primary Gastroenterologist:  Garfield Cornea, MD   Chief Complaint  Patient presents with  . Dysphagia    HPI:  Lauren [name] is a 78 y.o. female here for further evaluation of difficulty swallowing. Mostly with solid foods, odynophagia with solids. No heartburn. Not taking Prilosec because feels like does not need it. Dysphagia for 2-3 months. When swallowing water, feels like it wants to bubble up. Pills slow to go down.   Also worried about suprapubic tearing or burning intermittently. Going on for couple of months. Last for few minutes at a time. Still having difficulty getting out BM. Feels incomplete. No rectal bleeding, melena. Some small BM every day. No unintentional weight loss. Some weight loss with dietary changes.  She weighed 191 pounds in February 2012.  Current Outpatient Prescriptions  Medication Sig Dispense Refill  . ALPRAZolam (XANAX) 0.25 MG tablet Take 0.25 mg by mouth at bedtime as needed.        Marland Kitchen amLODipine-olmesartan (AZOR) 5-40 MG per tablet Take 1 tablet by mouth daily.      . Calcium Carbonate-Vitamin D (CALCIUM + D PO) Take by mouth.       . Fluticasone-Salmeterol (ADVAIR DISKUS) 500-50 MCG/DOSE AEPB Inhale 1 puff into the lungs every 12 (twelve) hours.       Marland Kitchen KRILL OIL 1000 MG CAPS Take 1,000 mg by mouth daily.      . Multiple Vitamins-Minerals (PROTEGRA PO) Take by mouth.       . oxybutynin (DITROPAN-XL) 10 MG 24 hr tablet Take 10 mg by mouth daily.        . Pitavastatin Calcium (LIVALO) 2 MG TABS Take by mouth.       . Psyllium (METAMUCIL) 30.9 % POWD Take 1 packet by mouth daily as needed. constipation      . guaiFENesin (MUCINEX) 600 MG 12 hr tablet Take 1,200 mg by mouth 2 (two) times daily.       Marland Kitchen omeprazole (PRILOSEC) 20 MG capsule Take 20 mg by mouth daily.          Allergies as of 07/06/2012 - Review Complete 07/06/2012  Allergen Reaction Noted  . Penicillins      Past Medical History    Diagnosis Date  . Asthma   . COPD (chronic obstructive pulmonary disease)   . Arthritis   . Hypertension   . Hyperlipidemia   . Back pain          Past Surgical History  Procedure Date  . Shoulder surgery     Rotator Cuff  . Ankle surgery     Rod and 13 screws  . Rotator cuff repair   . Appendectomy   . Breast biopsy 1996    Benign  . Abdominal hysterectomy 1994  . Colonoscopy 01/27/2011    Normal rectum/Sigmoid diverticula/Multiple colonic polyps removed (TUBULAR ADENOMA) Next TCS 01/2016  . Esophagogastroduodenoscopy 08/21/2003       Normal tubular esophagus status post passage of Maloney dilator as  described above/  2. Multiple linear antral erosions of uncertain significance, could be NSAID    Family History  Problem Relation Age of Onset  . Tuberculosis Mother 8    deceased  . Kidney failure Father 70  . Colon cancer Neg Hx     History   Social History  . Marital Status: Divorced    Spouse Name: N/A    Number of Children: 18  . Years of Education: N/A  Occupational History  .     Social History Main Topics  . Smoking status: Never Smoker   . Smokeless tobacco: Not on file  . Alcohol Use: No  . Drug Use: No  . Sexually Active:    Other Topics Concern  . Not on file   Social History Narrative  . No narrative on file      ROS:  General: Negative for anorexia, weight loss, fever, chills, fatigue, weakness. Eyes: Negative for vision changes.  ENT: Negative for hoarseness, difficulty swallowing , nasal congestion. CV: Negative for chest pain, angina, palpitations, dyspnea on exertion, peripheral edema. No chest pain recently. Respiratory: Negative for dyspnea at rest, dyspnea on exertion, cough, sputum, wheezing.  GI: See history of present illness. GU:  Negative for dysuria, hematuria, urinary incontinence, urinary frequency, nocturnal urination.  MS: Negative for joint pain, low back pain.  Derm: Negative for rash or itching.  Neuro:  Negative for weakness, abnormal sensation, seizure, frequent headaches, memory loss, confusion.  Psych: Negative for anxiety, depression, suicidal ideation, hallucinations. Recent stress but has improved Endo: Negative for unusual weight change.  Heme: Negative for bruising or bleeding. Allergy: Negative for rash or hives.    Physical Examination:  BP 161/77  Pulse 57  Temp 98.1 F (36.7 C) (Temporal)  Ht 5\' 5"  (1.651 m)  Wt 178 lb 6.4 oz (80.922 kg)  BMI 29.69 kg/m2   General: Well-nourished, well-developed in no acute distress. Accompanied by daughter. Head: Normocephalic, atraumatic.   Eyes: Conjunctiva pink, no icterus. Mouth: Oropharyngeal mucosa moist and pink , no lesions erythema or exudate. Neck: Supple without thyromegaly, masses, or lymphadenopathy.  Lungs: Clear to auscultation bilaterally.  Heart: Regular rate and rhythm, no murmurs rubs or gallops.  Abdomen: Bowel sounds are normal, nondistended, no hepatosplenomegaly or masses, no abdominal bruits. She has a small umbilical hernia easily reducible slightly tender. Mild tenderness over the suprapubic region. No rebound or guarding.   Rectal: not performed Extremities: No lower extremity edema. No clubbing or deformities.  Neuro: Alert and oriented x 4 , grossly normal neurologically.  Skin: Warm and dry, no rash or jaundice.   Psych: Alert and cooperative, normal mood and affect.   Imaging Studies: No results found.

## 2012-08-08 ENCOUNTER — Other Ambulatory Visit (HOSPITAL_COMMUNITY): Payer: Self-pay | Admitting: General Surgery

## 2012-08-08 DIAGNOSIS — Z139 Encounter for screening, unspecified: Secondary | ICD-10-CM

## 2012-08-10 ENCOUNTER — Encounter (HOSPITAL_COMMUNITY): Payer: Self-pay | Admitting: Internal Medicine

## 2012-09-12 ENCOUNTER — Ambulatory Visit (HOSPITAL_COMMUNITY)
Admission: RE | Admit: 2012-09-12 | Discharge: 2012-09-12 | Disposition: A | Discharge status: Home / Self Care | Payer: Medicare Other | Source: Ambulatory Visit | Attending: General Surgery | Admitting: General Surgery

## 2012-09-12 DIAGNOSIS — Z139 Encounter for screening, unspecified: Secondary | ICD-10-CM

## 2012-09-12 DIAGNOSIS — Z1231 Encounter for screening mammogram for malignant neoplasm of breast: Secondary | ICD-10-CM | POA: Insufficient documentation

## 2012-09-29 ENCOUNTER — Other Ambulatory Visit (HOSPITAL_COMMUNITY): Payer: Self-pay | Admitting: Pulmonary Disease

## 2012-09-29 ENCOUNTER — Encounter: Payer: Self-pay | Admitting: *Deleted

## 2012-09-29 ENCOUNTER — Ambulatory Visit (HOSPITAL_COMMUNITY)
Admission: RE | Admit: 2012-09-29 | Discharge: 2012-09-29 | Disposition: A | Discharge status: Home / Self Care | Payer: Medicare Other | Source: Ambulatory Visit | Attending: Pulmonary Disease | Admitting: Pulmonary Disease

## 2012-09-29 DIAGNOSIS — M25539 Pain in unspecified wrist: Secondary | ICD-10-CM | POA: Insufficient documentation

## 2012-09-29 DIAGNOSIS — M25512 Pain in left shoulder: Secondary | ICD-10-CM

## 2012-09-29 DIAGNOSIS — M25532 Pain in left wrist: Secondary | ICD-10-CM

## 2012-09-29 DIAGNOSIS — M25519 Pain in unspecified shoulder: Secondary | ICD-10-CM | POA: Insufficient documentation

## 2012-09-30 ENCOUNTER — Ambulatory Visit (INDEPENDENT_AMBULATORY_CARE_PROVIDER_SITE_OTHER): Payer: Medicare Other | Admitting: Cardiology

## 2012-09-30 ENCOUNTER — Encounter: Payer: Self-pay | Admitting: Cardiology

## 2012-09-30 ENCOUNTER — Encounter: Payer: Self-pay | Admitting: *Deleted

## 2012-09-30 VITALS — BP 182/88 | HR 56 | Ht 63.0 in | Wt 181.0 lb

## 2012-09-30 DIAGNOSIS — E782 Mixed hyperlipidemia: Secondary | ICD-10-CM

## 2012-09-30 DIAGNOSIS — J449 Chronic obstructive pulmonary disease, unspecified: Secondary | ICD-10-CM

## 2012-09-30 DIAGNOSIS — R072 Precordial pain: Secondary | ICD-10-CM

## 2012-09-30 DIAGNOSIS — J4489 Other specified chronic obstructive pulmonary disease: Secondary | ICD-10-CM

## 2012-09-30 DIAGNOSIS — I10 Essential (primary) hypertension: Secondary | ICD-10-CM

## 2012-09-30 NOTE — Patient Instructions (Addendum)
Your physician has requested that you have a lexiscan myoview. For further information please visit [email or web address]. Please follow instruction sheet, as given.  Your physician recommends that you schedule a follow-up appointment in: To be determined according to results of Lexiscan

## 2012-09-30 NOTE — Assessment & Plan Note (Signed)
Although described as a tightness, symptoms are quite prolonged, associated with mental stress and fatigue. No clear exertional component. ECG has been nonspecific. She has had prior workup including normal coronary arteries in 2000, last ischemic testing was in 2009. She does have active cardiac risk factors including hypertension and hyperlipidemia. We will followup with a Lexiscan Myoview for objective evaluation to exclude interval development of obstructive CAD. If reassuring, she can keep followup with Dr. Luan Pulling. Otherwise we will bring her back to discuss further evaluation.

## 2012-09-30 NOTE — Assessment & Plan Note (Signed)
Followed by Dr. Luan Pulling. She is on Livalo.

## 2012-09-30 NOTE — Assessment & Plan Note (Signed)
Blood pressure elevated today. She reports compliance with her medications. She has also been trying to lose some weight.

## 2012-09-30 NOTE — Progress Notes (Signed)
Clinical Summary Lauren [name] is a 78 y.o.female referred for cardiology consultation by Dr. Luan Pulling. She reports a history of intermittent chest "tightness", usually prolong lasting for several hours. This is nonexertional. She states without prompting that she is also under a tremendous amount of stress, caring for her ailing sister-in-law. She reports being very fatigued.  Record review finds prior office visit in 2009 with Dr. Johnsie Cancel. Adenosine Myoview at that time showed no ischemia with LVEF 70%.  ECG from July reviewed showing sinus bradycardia at 49 with PACs, poor R-wave progression suggestive of previous anterior infarct. More recent lab work in October showed potassium 4.4, BUN 14, creatinine 0.8, cholesterol 194, triglycerides 74, HDL 57, LDL 122, AST 18, ALT 16.  She has not undergone interval ischemic workup since 2009.Marland Kitchen   Allergies  Allergen Reactions  . Penicillins     REACTION: UNKNOWN REACTION    Current Outpatient Prescriptions  Medication Sig Dispense Refill  . ALPRAZolam (XANAX) 0.25 MG tablet Take 0.25 mg by mouth as needed.      Marland Kitchen amLODipine-olmesartan (AZOR) 5-40 MG per tablet Take 1 tablet by mouth daily.      . Calcium Carbonate-Vitamin D (CALCIUM + D PO) Take by mouth.       Marland Kitchen guaiFENesin (MUCINEX) 600 MG 12 hr tablet Take 1,200 mg by mouth as needed. congestion      . KRILL OIL 1000 MG CAPS Take 1,000 mg by mouth daily.      . Multiple Vitamins-Minerals (PROTEGRA PO) Take by mouth.       . oxybutynin (DITROPAN-XL) 10 MG 24 hr tablet Take 10 mg by mouth daily.        . Pitavastatin Calcium (LIVALO) 2 MG TABS Take 1 tablet by mouth daily.       . polyethylene glycol powder (GLYCOLAX/MIRALAX) powder Take 17 g by mouth daily.        Past Medical History  Diagnosis Date  . Asthma   . COPD (chronic obstructive pulmonary disease)   . Osteoarthritis   . Essential hypertension, benign   . Hyperlipidemia   . Back pain   . GERD (gastroesophageal reflux  disease)   . History of cardiac catheterization     Reportedly normal coronaries in 2000    Past Surgical History  Procedure Date  . Shoulder surgery     Rotator Cuff  . Ankle surgery     Rod and 13 screws  . Rotator cuff repair   . Appendectomy   . Breast biopsy 1996    Benign  . Abdominal hysterectomy 1994  . Colonoscopy 01/27/2011    Normal rectum/Sigmoid diverticula/Multiple colonic polyps removed (TUBULAR ADENOMA) Next TCS 01/2016  . Esophagogastroduodenoscopy 08/21/2003       Normal tubular esophagus status post passage of Maloney dilator as  described above/  2. Multiple linear antral erosions of uncertain significance, could be NSAID  . Esophagogastroduodenoscopy 08/04/2012    Procedure: ESOPHAGOGASTRODUODENOSCOPY (EGD);  Surgeon: Daneil Dolin, MD;  Location: [facility];  Service: Endoscopy;  Laterality: N/A;  7:30  . Esophageal dilation 08/04/2012    Procedure: ESOPHAGEAL DILATION;  Surgeon: Daneil Dolin, MD;  Location: [facility];  Service: Endoscopy;;    Family History  Problem Relation Age of Onset  . Tuberculosis Mother 66    Deceased  . Kidney failure Father 58  . Colon cancer Neg Hx     Social History Ms. Nardin reports that she has never smoked. She does not have any  smokeless tobacco history on file. Ms. Milford reports that she does not drink alcohol.  Review of Systems Rare palpitations, no syncope. States that she is somewhat unsteady on her feet. No falls. No reported bleeding episodes. No orthopnea or PND. Otherwise negative.  Physical Examination Filed Vitals:   09/30/12 1320  BP: 182/88  Pulse: 56   Filed Weights   09/30/12 1320  Weight: 181 lb (82.101 kg)   Patient in no acute distress. HEENT: Conjunctiva and lids normal, oropharynx clear. Neck: Supple, no elevated JVP or carotid bruits, no thyromegaly. Lungs: Decreased but clear to auscultation, nonlabored breathing at rest. Cardiac: Regular rate and rhythm, no S3, soft systolic  murmur, no pericardial rub. Abdomen: Soft, nontender, bowel sounds present, no guarding or rebound. Extremities: No pitting edema, distal pulses 2+. Skin: Warm and dry. Musculoskeletal: No kyphosis. Neuropsychiatric: Alert and oriented x3, affect grossly appropriate.   Problem List and Plan   Precordial pain Although described as a tightness, symptoms are quite prolonged, associated with mental stress and fatigue. No clear exertional component. ECG has been nonspecific. She has had prior workup including normal coronary arteries in 2000, last ischemic testing was in 2009. She does have active cardiac risk factors including hypertension and hyperlipidemia. We will followup with a Lexiscan Myoview for objective evaluation to exclude interval development of obstructive CAD. If reassuring, she can keep followup with Dr. Luan Pulling. Otherwise we will bring her back to discuss further evaluation.  Essential hypertension, benign Blood pressure elevated today. She reports compliance with her medications. She has also been trying to lose some weight.  COPD Followed by Dr. Luan Pulling. She uses oxygen at nighttime.  Mixed hyperlipidemia Followed by Dr. Luan Pulling. She is on Livalo.    Satira Sark, M.D., F.A.C.C.

## 2012-09-30 NOTE — Assessment & Plan Note (Signed)
Followed by Dr. Luan Pulling. She uses oxygen at nighttime.

## 2012-10-07 ENCOUNTER — Encounter (HOSPITAL_COMMUNITY)
Admission: RE | Admit: 2012-10-07 | Discharge: 2012-10-07 | Disposition: A | Discharge status: Home / Self Care | Payer: Medicare Other | Source: Ambulatory Visit | Attending: Cardiology | Admitting: Cardiology

## 2012-10-07 ENCOUNTER — Encounter (HOSPITAL_COMMUNITY): Payer: Self-pay

## 2012-10-07 ENCOUNTER — Encounter (HOSPITAL_COMMUNITY): Payer: Self-pay | Admitting: Cardiology

## 2012-10-07 ENCOUNTER — Ambulatory Visit (HOSPITAL_COMMUNITY)
Admission: RE | Admit: 2012-10-07 | Discharge: 2012-10-07 | Disposition: A | Discharge status: Home / Self Care | Payer: Medicare Other | Source: Ambulatory Visit | Attending: Cardiology | Admitting: Cardiology

## 2012-10-07 DIAGNOSIS — J449 Chronic obstructive pulmonary disease, unspecified: Secondary | ICD-10-CM | POA: Insufficient documentation

## 2012-10-07 DIAGNOSIS — R079 Chest pain, unspecified: Secondary | ICD-10-CM

## 2012-10-07 DIAGNOSIS — E785 Hyperlipidemia, unspecified: Secondary | ICD-10-CM | POA: Insufficient documentation

## 2012-10-07 DIAGNOSIS — R0789 Other chest pain: Secondary | ICD-10-CM | POA: Insufficient documentation

## 2012-10-07 DIAGNOSIS — I10 Essential (primary) hypertension: Secondary | ICD-10-CM | POA: Insufficient documentation

## 2012-10-07 DIAGNOSIS — J4489 Other specified chronic obstructive pulmonary disease: Secondary | ICD-10-CM | POA: Insufficient documentation

## 2012-10-07 DIAGNOSIS — R42 Dizziness and giddiness: Secondary | ICD-10-CM | POA: Insufficient documentation

## 2012-10-07 DIAGNOSIS — R072 Precordial pain: Secondary | ICD-10-CM

## 2012-10-07 DIAGNOSIS — K219 Gastro-esophageal reflux disease without esophagitis: Secondary | ICD-10-CM | POA: Insufficient documentation

## 2012-10-07 MED ORDER — REGADENOSON 0.4 MG/5ML IV SOLN
INTRAVENOUS | Status: AC
  Administered 2012-10-07: 0.4 mg via INTRAVENOUS
  Filled 2012-10-07: qty 5

## 2012-10-07 MED ORDER — SODIUM CHLORIDE 0.9 % IJ SOLN
INTRAMUSCULAR | Status: AC
  Administered 2012-10-07: 10 mL via INTRAVENOUS
  Filled 2012-10-07: qty 10

## 2012-10-07 MED ORDER — TECHNETIUM TC 99M SESTAMIBI - CARDIOLITE
30.0000 | Freq: Once | INTRAVENOUS | Status: AC | PRN
  Administered 2012-10-07: 32 via INTRAVENOUS

## 2012-10-07 MED ORDER — TECHNETIUM TC 99M SESTAMIBI - CARDIOLITE
10.0000 | Freq: Once | INTRAVENOUS | Status: AC | PRN
  Administered 2012-10-07: 08:00:00 10.5 via INTRAVENOUS

## 2012-10-07 NOTE — Addendum Note (Signed)
Encounter addended by: Donnajean Lopes, RN on: 10/07/2012 10:41 AM<BR>     Documentation filed: Notes Section, Charges VN, Inpatient MAR

## 2012-10-07 NOTE — Progress Notes (Signed)
Stress Lab Nurses Notes - [facility] Na  SHAKEDRA IGOU 10/07/2012 Reason for doing test: Chest Pain Type of test: Wille Glaser Nurse performing test: Gerrit Halls, RN Nuclear Medicine Tech: Dyanne Carrel Echo Tech: Not Applicable MD performing test: Myles Gip & Jory Sims NP Family MD: Luan Pulling Test explained and consent signed: yes IV started: 22g jelco, Saline lock flushed, No redness or edema and Saline lock started in radiology Symptoms: Chest Pain & dizziness Treatment/Intervention: None Reason test stopped: protocol completed After recovery IV was: Discontinued via X-ray tech and No redness or edema Patient to return to [facility] Med at : 10:30 Patient discharged: Home Patient's Condition upon discharge was: stable Comments: During test BP 126/61 & HR 83.  Recovery BP 129/67 & HR 58.  Symptoms resolved in recovery. Geanie Cooley T

## 2012-10-27 ENCOUNTER — Encounter: Payer: Self-pay | Admitting: Orthopedic Surgery

## 2012-10-27 ENCOUNTER — Ambulatory Visit (INDEPENDENT_AMBULATORY_CARE_PROVIDER_SITE_OTHER): Payer: Medicare Other | Admitting: Orthopedic Surgery

## 2012-10-27 DIAGNOSIS — M25539 Pain in unspecified wrist: Secondary | ICD-10-CM

## 2012-10-27 DIAGNOSIS — M654 Radial styloid tenosynovitis [de Quervain]: Secondary | ICD-10-CM

## 2012-10-27 NOTE — Patient Instructions (Signed)
You have received a steroid shot. 15% of patients experience increased pain at the injection site with in the next 24 hours. This is best treated with ice and tylenol extra strength 2 tabs every 8 hours. If you are still having pain please call the office.   WEAR BRACE X 6 WEEKS  APPLY MAX FREEZE (OVER THE COUNTER CREAM) 3 X A DAY  De Quervain's Disease Harriet Pho disease is a condition often seen in racquet sports where there is a soreness (inflammation) in the cord like structures (tendons) which attach muscle to bone on the thumb side of the wrist. There may be a tightening of the tissuesaround the tendons. This condition is often helped by giving up or modifying the activity which caused it. When conservative treatment does not help, surgery may be required. Conservative treatment could include changes in the activity which brought about the problem or made it worse. Anti-inflammatory medications and injections may be used to help decrease the inflammation and help with pain control. Your caregiver will help you determine which is best for you. DIAGNOSIS   Often the diagnosis (learning what is wrong) can be made by examination. Sometimes x-rays are required. HOME CARE INSTRUCTIONS    Apply ice to the sore area for 15 to 20 minutes, 3 to 4 times per day while awake. Put the ice in a plastic bag and place a towel between the bag of ice and your skin. This is especially helpful if it can be done after all activities involving the sore wrist.   Temporary splinting may help.   Only take over-the-counter or prescription medicines for pain, discomfort or fever as directed by your caregiver.  SEEK MEDICAL CARE IF:    Pain relief is not obtained with medications, or if you have increasing pain and seem to be getting worse rather than better.  MAKE SURE YOU:    Understand these instructions.   Will watch your condition.   Will get help right away if you are not doing well or get worse.   Document Released: 08/04/2001 Document Revised: 02/01/2012 Document Reviewed: 11/09/2005 [facility] Patient Information 2013 [place].

## 2012-11-01 ENCOUNTER — Encounter: Payer: Self-pay | Admitting: Orthopedic Surgery

## 2012-11-01 DIAGNOSIS — M654 Radial styloid tenosynovitis [de Quervain]: Secondary | ICD-10-CM | POA: Insufficient documentation

## 2012-11-01 NOTE — Progress Notes (Signed)
Patient ID: Lauren [name], female   DOB: 1934/08/11, 78 y.o.   MRN: [id number] Chief Complaint  Patient presents with  . Wrist Pain    Left wrist pain, no injury.   The patient is 78 years old she presents with pain in her left wrist which started about 4 weeks ago after she planted some flowers and lifted some flowerpots. She complains of throbbing stabbing 7/10 constant pain over the radial side of the wrist and thumb with no history of trauma  She has a history of shortness of breath, wheezing, cough, heartburn and joint pain  Medical history reviewed as recorded Physical Exam(12)  Vital signs:   GENERAL: normal development   CDV: pulses are normal   Skin: normal  Lymph: nodes were not palpable/normal  Psychiatric: awake, alert and oriented  Neuro: normal sensation  MSK  Gait: Normal 1 Inspection left wrist swelling over the extensor tendons compartments #1 2 Range of Motion painful ulnar deviation 3 Motor weakness in radial deviation 4 Stability normal  Other side:  5 normal range of motion 6 normal appearance  Imaging x-ray negative  Assessment: De Quervain's syndrome    Plan: Rino splint, inject, Max Vergie Living. 6 weeks followup  VERBAL CONSENT TO INJECT TIME OUT COMPLETED   The left wrist was cleaned with alcohol and sprayed with ethyl chloride   Then 1cc of depomedrol (40mg ) and 3 cc of 1% lidocaine was injected into the 1st ext compartment   No complications

## 2012-12-08 ENCOUNTER — Ambulatory Visit (INDEPENDENT_AMBULATORY_CARE_PROVIDER_SITE_OTHER): Payer: Medicare Other | Admitting: Orthopedic Surgery

## 2012-12-08 VITALS — BP 134/70 | Ht 63.0 in | Wt 181.0 lb

## 2012-12-08 DIAGNOSIS — M654 Radial styloid tenosynovitis [de Quervain]: Secondary | ICD-10-CM

## 2012-12-08 NOTE — Progress Notes (Signed)
Patient ID: Lauren [name], female   DOB: 25-Aug-1934, 79 y.o.   MRN: [id number] Chief Complaint  Patient presents with  . Follow-up    recheck dequervains syndrome    The patient's left upper brain syndrome has improved with bracing topical medication and injection  She still has some discomfort  Review of systems denies numbness or tingling  BP 134/70  Ht 5\' 3"  (1.6 m)  Wt 181 lb (82.101 kg)  BMI 32.06 kg/m2 General appearance is normal she is oriented x3 her mood and affect are normal as well. She has some tenderness over the extensor tendons of the first compartment of the left thumb with painful passive range of motion no instability normal extension power skin is intact  Impression resolving de Quervain's syndrome recommend continue bracing for 3 weeks then wean out of brace followup in 6 weeks

## 2012-12-08 NOTE — Patient Instructions (Addendum)
Continue present treatment x 3 weeks

## 2013-01-19 ENCOUNTER — Ambulatory Visit: Payer: Medicare Other | Admitting: Orthopedic Surgery

## 2013-01-24 ENCOUNTER — Ambulatory Visit: Payer: Medicare Other | Admitting: Orthopedic Surgery

## 2013-01-25 ENCOUNTER — Other Ambulatory Visit: Payer: Self-pay | Admitting: *Deleted

## 2013-01-25 ENCOUNTER — Ambulatory Visit (INDEPENDENT_AMBULATORY_CARE_PROVIDER_SITE_OTHER): Payer: Medicare Other | Admitting: Orthopedic Surgery

## 2013-01-25 VITALS — BP 130/80 | Ht 63.0 in | Wt 181.0 lb

## 2013-01-25 DIAGNOSIS — M654 Radial styloid tenosynovitis [de Quervain]: Secondary | ICD-10-CM

## 2013-01-25 NOTE — Progress Notes (Signed)
Patient ID: Lauren [name], female   DOB: 1934/04/02, 79 y.o.   MRN: [id number] Chief Complaint  Patient presents with  . Follow-up    6 week recheck on left wrist.    79 years old, complains of pain over the LEFT extensor tendons to the, thumb. She's had an, injection. She's had bracing she has not improved. She still has pain to positive Finkelstein's test.  We discussed surgical release.  We will arrange that for Friday, March 14  Followup the 17th.  CPT code 25000.  Full history and physical will be dictatedThis will be incorporated by reference

## 2013-01-25 NOTE — Patient Instructions (Addendum)
March 14th left dequervains release De Quervain's Tenosynovitis Surgical Release Care After Refer to this sheet in the next few weeks. These instructions provide you with information on caring for yourself after your procedure. Your caregiver may also give you specific instructions. Your treatment has been planned according to current medical practices, but problems sometimes occur. Call your caregiver if you have any problems or questions after your procedure. HOME CARE INSTRUCTIONS   Take any medication that your surgeon prescribed. Follow the directions carefully.  Only take over-the-counter or prescription medicines for pain, discomfort or fever as directed by your caregiver. Do not take aspirin unless directed by your surgeon. Aspirin increases the chances of bleeding.  For the first few days after your surgery, keep your arm above your heart as much as possible. This can help prevent swelling.  Do not get the incision wet for several days (or as directed by your surgeon).  Follow your surgeon's directions for changing the dressing over the incision.  Wear the splint as directed by your surgeon.  You may be given simple exercises to do once the splint can be removed during the day. These will keep the wrist from getting stiff and keep it from swelling. The exercises also will help bring back normal movement. When you can move the wrist and thumb normally, you will start doing exercises to make them stronger. You might do these on your own or you might work with a physical or occupational therapist.  Dennis Bast may need more treatment to help the wrist heal. This could include:  Massage.  Stimulation of the nerves with electrical waves.  Do not rush to use your hand before allowed by your surgeon. Using your wrist and thumb before they are healed can cause a delay in recovery. SEEK MEDICAL CARE IF:   Your incision area becomes red, swollen, or feels warm to the touch.  You have blood or  pus coming from the incision.  Your incision smells bad. This could mean it is infected.  Your pain in your wrist or hand increases or it does not get better with pain medicine. SEEK IMMEDIATE MEDICAL CARE IF:  You have a fever. Document Released: 03/28/2009 Document Revised: 02/01/2012 Document Reviewed: 03/28/2009 [facility] Patient Information 2013 [place]   .cpt code 25000

## 2013-01-26 ENCOUNTER — Telehealth: Payer: Self-pay | Admitting: Orthopedic Surgery

## 2013-01-26 NOTE — H&P (Signed)
Lauren [name] is an 79 y.o. female.   Chief Complaint: Left wrist and thumb pain HPI: 79 year old female complains of Location  pain along the radial aspect of the left thumb   Quality  aching   Severity  severe   Timing  constant   Duration  several months  Context  no trauma   Factors better/worse  ulnar deviation   Associated symptoms  Swelling  Past Medical History  Diagnosis Date  . Asthma   . COPD (chronic obstructive pulmonary disease)   . Osteoarthritis   . Essential hypertension, benign   . Hyperlipidemia   . Back pain   . GERD (gastroesophageal reflux disease)   . History of cardiac catheterization     Reportedly normal coronaries in 2000    Past Surgical History  Procedure Laterality Date  . Shoulder surgery      Rotator Cuff  . Ankle surgery      Rod and 13 screws  . Rotator cuff repair    . Appendectomy    . Breast biopsy  1996    Benign  . Abdominal hysterectomy  1994  . Colonoscopy  01/27/2011    Normal rectum/Sigmoid diverticula/Multiple colonic polyps removed (TUBULAR ADENOMA) Next TCS 01/2016  . Esophagogastroduodenoscopy  08/21/2003       Normal tubular esophagus status post passage of Maloney dilator as  described above/  2. Multiple linear antral erosions of uncertain significance, could be NSAID  . Esophagogastroduodenoscopy  08/04/2012    Procedure: ESOPHAGOGASTRODUODENOSCOPY (EGD);  Surgeon: Daneil Dolin, MD;  Location: [facility];  Service: Endoscopy;  Laterality: N/A;  7:30  . Esophageal dilation  08/04/2012    Procedure: ESOPHAGEAL DILATION;  Surgeon: Daneil Dolin, MD;  Location: [facility];  Service: Endoscopy;;    Family History  Problem Relation Age of Onset  . Tuberculosis Mother 61    Deceased  . Kidney failure Father 3  . Colon cancer Neg Hx   . Asthma    . Arthritis     Social History:  reports that she has never smoked. She does not have any smokeless tobacco history on file. She reports that she does not  drink alcohol or use illicit drugs.  Allergies:  Allergies  Allergen Reactions  . Penicillins     REACTION: UNKNOWN REACTION    No prescriptions prior to admission    No results found for this or any previous visit (from the past 48 hour(s)). No results found.  Review of Systems  Musculoskeletal: Positive for myalgias.  All other systems reviewed and are negative.    There were no vitals taken for this visit. Physical Exam  Vitals reviewed. Constitutional: She is oriented to person, place, and time. She appears well-developed and well-nourished.  HENT:  Head: Normocephalic.  Eyes: Pupils are equal, round, and reactive to light.  Neck: Normal range of motion.  Cardiovascular: Normal rate and intact distal pulses.   Respiratory: Effort normal.  GI: Soft.  Lymphadenopathy:    She has no cervical adenopathy.  Neurological: She is alert and oriented to person, place, and time. She has normal reflexes. She displays normal reflexes. No cranial nerve deficit. She exhibits normal muscle tone. Coordination normal.  Skin: Skin is warm and dry. No rash noted. No erythema. No pallor.  Psychiatric: She has a normal mood and affect. Her behavior is normal. Judgment and thought content normal.   musculoskeletal exam  Right upper extremity is normal, the lower extremities have no gross  abnormalities on inspection range of motion is functional joints are stable muscle tone normal scans intact  Left upper extremity inspection tenderness over the first extensor compartment swelling painful range of motion especially ulnar deviation CMC joint tender notable for arthritic changes on x-ray primary problem however is the extensor tendons in the first compartment and strength is normal scans intact good pulse and sensation.  Assessment/Plan De Quervain's syndrome left wrist  Tennis Must Quervain's release left thumb  Arther Abbott 01/26/2013, 2:08 PM

## 2013-01-26 NOTE — Patient Instructions (Addendum)
Lauren [name]  01/26/2013   Your procedure is scheduled on:  02/03/13  Report to [facility] at  1100 AM.  Call this number if you have problems the morning of surgery: [phone]   Remember:   Do not eat food or drink liquids after midnight.   Take these medicines the morning of surgery with A SIP OF WATER: xanax,cozaar,ditropan   Do not wear jewelry, make-up or nail polish.  Do not wear lotions, powders, or perfumes.   Do not shave 48 hours prior to surgery. Men may shave face and neck.  Do not bring valuables to the hospital.  Contacts, dentures or bridgework may not be worn into surgery.  Leave suitcase in the car. After surgery it may be brought to your room.  For patients admitted to the hospital, checkout time is 11:00 AM the day of discharge.   Patients discharged the day of surgery will not be allowed to drive  home.  Name and phone number of your driver: family  Special Instructions: Shower using CHG 2 nights before surgery and the night before surgery.  If you shower the day of surgery use CHG.  Use special wash - you have one bottle of CHG for all showers.  You should use approximately 1/3 of the bottle for each shower.   Please read over the following fact sheets that you were given: Pain Booklet, Coughing and Deep Breathing, MRSA Information, Surgical Site Infection Prevention, Anesthesia Post-op Instructions and Care and Recovery After Surgery  De Quervain's Tenosynovitis Surgical Release Surgery for de Quervain's tenosynovitis is done to relieve swelling and pain in the wrist and thumb. The swelling and pain was caused by inflammation (the body's way of reacting to injury or infection). The inflammation occurred at the covering (sheath) around the tendons (tough cords of tissue). The sheath was opened during surgery. This opening gives the tendons more space to move. Once you recover from the surgery, the pain you used to feel should be improved. Normal movement of  your wrist and thumb should return. Most of the time, surgery for de Quervain's tenosynovitis is an outpatient procedure. That means you will go home the same day. LET YOUR CAREGIVER KNOW ABOUT:   Any allergies.  Medicines taken including herbs, eyedrops, prescription medicines (especially blood thinners [anticoagulants]), aspirin and other over-the-counter medicines and steroids (by mouth or cream).  Previous problems with anesthetics or medicines used to numb the skin.  Possibility of pregnancy, if this applies.  Any history of blood clots in your legs or lungs.  Any history of bleeding or other blood problems.  Previous surgery.  Other important health problems. RISKS AND COMPLICATIONS  Pain at the incision site.  Blood that pools under the incision (hematoma).  Infection at the incision site.  Swelling.  Allergic reaction to the anesthesia.  A return of pain or stiffness in the thumb.  Rarely, nerve injury. BEFORE SURGERY  Two weeks before your surgery, stop using aspirin and non-steroidal anti-inflammatory drugs (NSAIDs) for pain relief. This includes prescription drugs and over-the-counter anti-inflammatory drugs. Also stop taking vitamin E.  If you take anticoagulants, ask your caregiver when you should stop taking them.  Stop smoking.  Do not eat or drink for about 8 hours before your surgery.  Arrive at least an hour before the surgery, or whenever your surgeon recommends. This will give you time to check in and fill out any paperwork.  Your surgery will probably be an outpatient  procedure. That means you will go home the same day. Make arrangements in advance for someone to drive you home. THE PROCEDURE  You will be given a local anesthetic or regional anesthetic so you do not feel pain. Your thumb and wrist area will be numbed, but you will still be awake.  The surgeon will make a cut (incision) in the wrist. Then, the sheath that wraps around the tendons  will be opened. This gives the tendons inside the sheath more room. There will be less pressure on them, and that helps relieve the pain.  If there are small sacs filled with fluid (cysts) or other inflamed tissues, they can be removed during the surgery.  The incision will be closed with small stitches. This will be covered with a dressing (a piece of gauze).  A splint or brace will be put on your wrist and thumb area to keep it from moving. AFTER SURGERY  You will stay in a recovery area until the anesthesia has worn off. Your blood pressure and pulse will be checked often. Once your body functions are back to normal, you will be able to go home.  You will have a splint or brace on your wrist and thumb area.  The surgeon will give you a prescription for pain medication to keep you comfortable.  Before you are sent home, you will be shown how to care for the area around the cut (incision) that was made during surgery. Make sure you know how often the bandage (dressing) should be changed. You also need to know when the splint and dressing can be taken off for good.  Ask whether you will need physical or occupational therapy. If so, ask about referrals.  Be patient. It will take a few weeks before you can use your wrist and thumb normally again. Document Released: 03/28/2009 Document Revised: 02/01/2012 Document Reviewed: 03/28/2009 [facility] Patient Information 2013 [place].  PATIENT INSTRUCTIONS POST-ANESTHESIA  IMMEDIATELY FOLLOWING SURGERY:  Do not drive or operate machinery for the first twenty four hours after surgery.  Do not make any important decisions for twenty four hours after surgery or while taking narcotic pain medications or sedatives.  If you develop intractable nausea and vomiting or a severe headache please notify your doctor immediately.  FOLLOW-UP:  Please make an appointment with your surgeon as instructed. You do not need to follow up with anesthesia unless  specifically instructed to do so.  WOUND CARE INSTRUCTIONS (if applicable):  Keep a dry clean dressing on the anesthesia/puncture wound site if there is drainage.  Once the wound has quit draining you may leave it open to air.  Generally you should leave the bandage intact for twenty four hours unless there is drainage.  If the epidural site drains for more than 36-48 hours please call the anesthesia department.  QUESTIONS?:  Please feel free to call your physician or the hospital operator if you have any questions, and they will be happy to assist you.

## 2013-01-26 NOTE — Telephone Encounter (Signed)
Contacted [employer] at [phone], per representative, Ennis Forts. No precert required for outpatient surgery for CPT code 25000.

## 2013-01-27 ENCOUNTER — Encounter (HOSPITAL_COMMUNITY)
Admission: RE | Admit: 2013-01-27 | Discharge: 2013-01-27 | Disposition: A | Discharge status: Home / Self Care | Payer: Medicare Other | Source: Ambulatory Visit

## 2013-01-30 ENCOUNTER — Encounter (HOSPITAL_COMMUNITY)
Admission: RE | Admit: 2013-01-30 | Discharge: 2013-01-30 | Disposition: A | Discharge status: Home / Self Care | Payer: Medicare Other | Source: Ambulatory Visit | Attending: Orthopedic Surgery | Admitting: Orthopedic Surgery

## 2013-01-30 ENCOUNTER — Encounter (HOSPITAL_COMMUNITY): Payer: Self-pay | Admitting: Pharmacy Technician

## 2013-01-30 ENCOUNTER — Encounter (HOSPITAL_COMMUNITY): Payer: Self-pay

## 2013-01-30 LAB — HEMOGLOBIN AND HEMATOCRIT, BLOOD
HCT: 35.9 % — ABNORMAL LOW (ref 36.0–46.0)
Hemoglobin: 11.6 g/dL — ABNORMAL LOW (ref 12.0–15.0)

## 2013-01-30 LAB — BASIC METABOLIC PANEL
BUN: 22 mg/dL (ref 6–23)
CO2: 29 mEq/L (ref 19–32)
Calcium: 9.6 mg/dL (ref 8.4–10.5)
Chloride: 104 mEq/L (ref 96–112)
Creatinine, Ser: 0.78 mg/dL (ref 0.50–1.10)
GFR calc Af Amer: >90 mL/min (ref >90)
GFR calc non Af Amer: 79 mL/min — ABNORMAL LOW (ref >90)
Glucose, Bld: 107 mg/dL — ABNORMAL HIGH (ref 70–99)
Potassium: 5 mEq/L (ref 3.5–5.1)
Sodium: 140 mEq/L (ref 135–145)

## 2013-01-30 LAB — SURGICAL PCR SCREEN
MRSA, PCR: NEGATIVE
Staphylococcus aureus: NEGATIVE

## 2013-02-03 ENCOUNTER — Encounter (HOSPITAL_COMMUNITY)
Admission: RE | Disposition: A | Discharge status: Home / Self Care | Payer: Self-pay | Source: Ambulatory Visit | Attending: Orthopedic Surgery

## 2013-02-03 ENCOUNTER — Ambulatory Visit (HOSPITAL_COMMUNITY)
Admission: RE | Admit: 2013-02-03 | Discharge: 2013-02-03 | Disposition: A | Discharge status: Home / Self Care | Payer: Medicare Other | Source: Ambulatory Visit | Attending: Orthopedic Surgery | Admitting: Orthopedic Surgery

## 2013-02-03 ENCOUNTER — Ambulatory Visit (HOSPITAL_COMMUNITY): Payer: Medicare Other | Admitting: Anesthesiology

## 2013-02-03 ENCOUNTER — Encounter (HOSPITAL_COMMUNITY): Payer: Self-pay | Admitting: *Deleted

## 2013-02-03 ENCOUNTER — Other Ambulatory Visit: Payer: Self-pay

## 2013-02-03 ENCOUNTER — Encounter (HOSPITAL_COMMUNITY): Payer: Self-pay | Admitting: Anesthesiology

## 2013-02-03 DIAGNOSIS — J449 Chronic obstructive pulmonary disease, unspecified: Secondary | ICD-10-CM | POA: Insufficient documentation

## 2013-02-03 DIAGNOSIS — Z01812 Encounter for preprocedural laboratory examination: Secondary | ICD-10-CM | POA: Insufficient documentation

## 2013-02-03 DIAGNOSIS — M654 Radial styloid tenosynovitis [de Quervain]: Secondary | ICD-10-CM

## 2013-02-03 DIAGNOSIS — Z01810 Encounter for preprocedural cardiovascular examination: Secondary | ICD-10-CM | POA: Insufficient documentation

## 2013-02-03 DIAGNOSIS — J4489 Other specified chronic obstructive pulmonary disease: Secondary | ICD-10-CM | POA: Insufficient documentation

## 2013-02-03 DIAGNOSIS — I10 Essential (primary) hypertension: Secondary | ICD-10-CM | POA: Insufficient documentation

## 2013-02-03 HISTORY — PX: DORSAL COMPARTMENT RELEASE: SHX5039

## 2013-02-03 SURGERY — RELEASE, FIRST DORSAL COMPARTMENT, HAND
Anesthesia: Regional | Site: Hand | Laterality: Left | Wound class: Clean

## 2013-02-03 MED ORDER — FENTANYL CITRATE 0.05 MG/ML IJ SOLN
INTRAMUSCULAR | Status: AC
  Filled 2013-02-03: qty 2

## 2013-02-03 MED ORDER — SODIUM CHLORIDE 0.9 % IJ SOLN
INTRAMUSCULAR | Status: AC
  Filled 2013-02-03: qty 10

## 2013-02-03 MED ORDER — ATROPINE SULFATE 1 MG/ML IJ SOLN
0.4000 mg | Freq: Once | INTRAMUSCULAR | Status: AC
  Administered 2013-02-03: 0.4 mg via INTRAVENOUS

## 2013-02-03 MED ORDER — SODIUM CHLORIDE 0.9 % IR SOLN
Status: DC | PRN
  Administered 2013-02-03: 1000 mL

## 2013-02-03 MED ORDER — FENTANYL CITRATE 0.05 MG/ML IJ SOLN: 25.0000 ug | INTRAMUSCULAR | Status: DC | PRN

## 2013-02-03 MED ORDER — VANCOMYCIN HCL 1000 MG IV SOLR
1000.0000 mg | INTRAVENOUS | Status: DC | PRN
  Administered 2013-02-03: 1000 mg via INTRAVENOUS

## 2013-02-03 MED ORDER — MIDAZOLAM HCL 2 MG/2ML IJ SOLN
1.0000 mg | INTRAMUSCULAR | Status: DC | PRN
  Administered 2013-02-03: 2 mg via INTRAVENOUS

## 2013-02-03 MED ORDER — LACTATED RINGERS IV SOLN
INTRAVENOUS | Status: DC
  Administered 2013-02-03: 1000 mL via INTRAVENOUS

## 2013-02-03 MED ORDER — ONDANSETRON HCL 4 MG/2ML IJ SOLN: 4.0000 mg | Freq: Once | INTRAMUSCULAR | Status: AC | PRN

## 2013-02-03 MED ORDER — BUPIVACAINE-EPINEPHRINE PF 0.5-1:200000 % IJ SOLN
INTRAMUSCULAR | Status: AC
  Filled 2013-02-03: qty 10

## 2013-02-03 MED ORDER — VANCOMYCIN HCL IN DEXTROSE 1-5 GM/200ML-% IV SOLN
1000.0000 mg | INTRAVENOUS | Status: AC
  Administered 2013-02-03: 1000 mg via INTRAVENOUS

## 2013-02-03 MED ORDER — PROPOFOL 10 MG/ML IV EMUL
INTRAVENOUS | Status: AC
  Filled 2013-02-03: qty 20

## 2013-02-03 MED ORDER — GLYCOPYRROLATE 0.2 MG/ML IJ SOLN
INTRAMUSCULAR | Status: AC
  Filled 2013-02-03: qty 2

## 2013-02-03 MED ORDER — BUPIVACAINE-EPINEPHRINE 0.5% -1:200000 IJ SOLN
INTRAMUSCULAR | Status: DC | PRN
  Administered 2013-02-03: 14 mL

## 2013-02-03 MED ORDER — MIDAZOLAM HCL 2 MG/2ML IJ SOLN
INTRAMUSCULAR | Status: AC
  Filled 2013-02-03: qty 2

## 2013-02-03 MED ORDER — CHLORHEXIDINE GLUCONATE 4 % EX LIQD: 60.0000 mL | Freq: Once | CUTANEOUS | Status: DC

## 2013-02-03 MED ORDER — FENTANYL CITRATE 0.05 MG/ML IJ SOLN
INTRAMUSCULAR | Status: DC | PRN
  Administered 2013-02-03 (×2): 25 ug via INTRAVENOUS

## 2013-02-03 MED ORDER — LIDOCAINE HCL (PF) 0.5 % IJ SOLN
INTRAMUSCULAR | Status: AC
  Filled 2013-02-03: qty 50

## 2013-02-03 MED ORDER — LIDOCAINE HCL (PF) 0.5 % IJ SOLN
INTRAMUSCULAR | Status: DC | PRN
  Administered 2013-02-03: 250 mg via INTRAVENOUS

## 2013-02-03 MED ORDER — VANCOMYCIN HCL IN DEXTROSE 1-5 GM/200ML-% IV SOLN
INTRAVENOUS | Status: AC
  Filled 2013-02-03: qty 200

## 2013-02-03 MED ORDER — PROPOFOL INFUSION 10 MG/ML OPTIME
INTRAVENOUS | Status: DC | PRN
  Administered 2013-02-03: 50 ug/kg/min via INTRAVENOUS

## 2013-02-03 MED ORDER — ACETAMINOPHEN-CODEINE #3 300-30 MG PO TABS: 1.0000 | ORAL_TABLET | ORAL | Status: DC | PRN

## 2013-02-03 MED ORDER — ATROPINE SULFATE 1 MG/ML IJ SOLN
INTRAMUSCULAR | Status: AC
  Filled 2013-02-03: qty 1

## 2013-02-03 MED ORDER — MIDAZOLAM HCL 5 MG/5ML IJ SOLN
INTRAMUSCULAR | Status: DC | PRN
  Administered 2013-02-03 (×2): 1 mg via INTRAVENOUS

## 2013-02-03 SURGICAL SUPPLY — 44 items
ADH SKN CLS APL DERMABOND .7 (GAUZE/BANDAGES/DRESSINGS) ×1
BAG HAMPER (MISCELLANEOUS) ×2 IMPLANT
BANDAGE ELASTIC 3 VELCRO ST LF (GAUZE/BANDAGES/DRESSINGS) ×1 IMPLANT
BANDAGE ESMARK 4X12 BL STRL LF (DISPOSABLE) ×1 IMPLANT
BNDG CMPR 12X4 ELC STRL LF (DISPOSABLE) ×1
BNDG COHESIVE 4X5 TAN NS LF (GAUZE/BANDAGES/DRESSINGS) ×2 IMPLANT
BNDG ESMARK 4X12 BLUE STRL LF (DISPOSABLE) ×2
CHLORAPREP W/TINT 26ML (MISCELLANEOUS) ×2 IMPLANT
CLOTH BEACON ORANGE TIMEOUT ST (SAFETY) ×2 IMPLANT
COVER LIGHT HANDLE STERIS (MISCELLANEOUS) ×4 IMPLANT
CUFF TOURNIQUET SINGLE 18IN (TOURNIQUET CUFF) ×2 IMPLANT
DECANTER SPIKE VIAL GLASS SM (MISCELLANEOUS) ×2 IMPLANT
DERMABOND ADVANCED (GAUZE/BANDAGES/DRESSINGS) ×1
DERMABOND ADVANCED .7 DNX12 (GAUZE/BANDAGES/DRESSINGS) IMPLANT
DRSG TEGADERM 4X4.75 (GAUZE/BANDAGES/DRESSINGS) ×1 IMPLANT
DRSG XEROFORM 1X8 (GAUZE/BANDAGES/DRESSINGS) ×1 IMPLANT
ELECT REM PT RETURN 9FT ADLT (ELECTROSURGICAL) ×2
ELECTRODE REM PT RTRN 9FT ADLT (ELECTROSURGICAL) ×1 IMPLANT
GLOVE BIOGEL PI IND STRL 7.0 (GLOVE) IMPLANT
GLOVE BIOGEL PI IND STRL 8 (GLOVE) IMPLANT
GLOVE BIOGEL PI INDICATOR 7.0 (GLOVE) ×2
GLOVE BIOGEL PI INDICATOR 8 (GLOVE) ×1
GLOVE ECLIPSE 6.5 STRL STRAW (GLOVE) ×1 IMPLANT
GLOVE ECLIPSE 7.0 STRL STRAW (GLOVE) ×1 IMPLANT
GLOVE SKINSENSE NS SZ8.0 LF (GLOVE) ×1
GLOVE SKINSENSE STRL SZ8.0 LF (GLOVE) ×1 IMPLANT
GLOVE SS N UNI LF 8.5 STRL (GLOVE) ×2 IMPLANT
GOWN STRL REIN XL XLG (GOWN DISPOSABLE) ×6 IMPLANT
INST SET MINOR BONE (KITS) ×2 IMPLANT
KIT ROOM TURNOVER APOR (KITS) ×2 IMPLANT
MANIFOLD NEPTUNE II (INSTRUMENTS) ×2 IMPLANT
NDL HYPO 21X1.5 SAFETY (NEEDLE) ×1 IMPLANT
NEEDLE HYPO 21X1.5 SAFETY (NEEDLE) ×2 IMPLANT
NS IRRIG 1000ML POUR BTL (IV SOLUTION) ×2 IMPLANT
PACK BASIC LIMB (CUSTOM PROCEDURE TRAY) ×2 IMPLANT
PAD ARMBOARD 7.5X6 YLW CONV (MISCELLANEOUS) ×2 IMPLANT
SET BASIN LINEN APH (SET/KITS/TRAYS/PACK) ×2 IMPLANT
SPONGE GAUZE 2X2 8PLY STRL LF (GAUZE/BANDAGES/DRESSINGS) ×1 IMPLANT
SPONGE GAUZE 4X4 12PLY (GAUZE/BANDAGES/DRESSINGS) IMPLANT
STRIP CLOSURE SKIN 1/2X4 (GAUZE/BANDAGES/DRESSINGS) ×1 IMPLANT
SUT ETHILON 3 0 FSL (SUTURE) ×1 IMPLANT
SUT MON AB 2-0 SH 27 (SUTURE) ×2
SUT MON AB 2-0 SH27 (SUTURE) ×1 IMPLANT
SYR CONTROL 10ML LL (SYRINGE) ×1 IMPLANT

## 2013-02-03 NOTE — Anesthesia Preprocedure Evaluation (Signed)
Anesthesia Evaluation  Patient identified by MRN, date of birth, ID band Patient awake    Reviewed: Allergy & Precautions, H&P , NPO status , Patient's Chart, lab work & pertinent test results  Airway Mallampati: II TM Distance: >3 FB     Dental  (+) Partial Lower and Partial Upper   Pulmonary asthma , COPDformer smoker,  breath sounds clear to auscultation        Cardiovascular hypertension, Pt. on medications - angina (no syncope)Rhythm:Regular Rate:Bradycardia  Sinus brady at VR=40    Neuro/Psych    GI/Hepatic GERD-  Medicated and Controlled,  Endo/Other    Renal/GU      Musculoskeletal   Abdominal   Peds  Hematology   Anesthesia Other Findings   Reproductive/Obstetrics                           Anesthesia Physical Anesthesia Plan  ASA: III  Anesthesia Plan: Bier Block   Post-op Pain Management:    Induction: Intravenous  Airway Management Planned: Nasal Cannula  Additional Equipment:   Intra-op Plan:   Post-operative Plan:   Informed Consent: I have reviewed the patients History and Physical, chart, labs and discussed the procedure including the risks, benefits and alternatives for the proposed anesthesia with the patient or authorized representative who has indicated his/her understanding and acceptance.     Plan Discussed with:   Anesthesia Plan Comments:         Anesthesia Quick Evaluation

## 2013-02-03 NOTE — Transfer of Care (Signed)
Immediate Anesthesia Transfer of Care Note  Patient: Lauren [name]  Procedure(s) Performed: Procedure(s): RELEASE DORSAL COMPARTMENT (DEQUERVAIN) (Left)  Patient Location: PACU  Anesthesia Type:Bier block  Level of Consciousness: awake and patient cooperative  Airway & Oxygen Therapy: Patient Spontanous Breathing and Patient connected to face mask oxygen  Post-op Assessment: Report given to PACU RN, Post -op Vital signs reviewed and stable and Patient moving all extremities  Post vital signs: Reviewed and stable  Complications: No apparent anesthesia complications

## 2013-02-03 NOTE — Anesthesia Procedure Notes (Signed)
Anesthesia Regional Block:  Bier block (IV Regional)  Pre-Anesthetic Checklist: ,, timeout performed, Correct Patient, Correct Site, Correct Laterality, Correct Procedure, Correct Position, site marked, surgical consent, pre-op evaluation,  Laterality: Left     Needles:  Injection technique: Single-shot      Additional Needles: Bier block (IV Regional) Narrative:  Resident/CRNA: Patrici Ranks

## 2013-02-03 NOTE — Brief Op Note (Addendum)
02/03/2013  11:58 AM  PATIENT:  Lauren [name]  79 y.o. female  PRE-OPERATIVE DIAGNOSIS:  Harriet Pho Disease (radial styloid tenosynovitis)  POST-OPERATIVE DIAGNOSIS:  Harriet Pho Disease (radial styloid tenosynovitis)  PROCEDURE:  Procedure(s): RELEASE DORSAL COMPARTMENT (DEQUERVAIN) (Left)  Findings the first extensor compartment had multiple slips especially of the EPB as a separate compartment for the APL there is significant synovial fluid and tenosynovitis  SURGEON:  Surgeon(s) and Role:    * Carole Civil, MD - Primary  PHYSICIAN ASSISTANT:   ASSISTANTS: holly proztek   ANESTHESIA:   regional  EBL:  Total I/O In: 500 [I.V.:500] Out: -   BLOOD ADMINISTERED:none  DRAINS: none  LOCAL MEDICATIONS USED:  MARCAINE   , Amount: 14 ml and OTHER epi  SPECIMEN:  No Specimen  DISPOSITION OF SPECIMEN:  N/A  COUNTS:  YES  TOURNIQUET:   Total Tourniquet Time Documented: Upper Arm (Left) - 38 minutes Total: Upper Arm (Left) - 38 minutes   DICTATION: .Viviann Spare Dictation  PLAN OF CARE: Discharge to home after PACU  PATIENT DISPOSITION:  PACU - hemodynamically stable.   Delay start of Pharmacological VTE agent (>24hrs) due to surgical blood loss or risk of bleeding: not applicable

## 2013-02-03 NOTE — Op Note (Signed)
Details of procedure please review brief operative for review.  The patient was identified in the preop holding area the left wrist and thumb were marked as a surgical site. She was then taken to the operating room for general anesthesia was given vancomycin secondary to penicillin allergy. A Bier block was given.  The office and prepped and draped sterilely.  A longitudinal incision was made over the first extensor compartment. The subcutaneous tissue was divided bluntly after skin incision. The superficial branch of the radial nerve was dissected and retracted. Extensor retinaculum was then released on the dorsal side. The compartment was opened there were separate slips for the APL EPB. There were separate slips for the extensor pollicis brevis. These were released and a tenosynovectomy was performed.  And expanded in attendance tended to sublux isolated was Monocryl suture was placed. Flexion extension of the wrist was repeated and the tendons were stable.  14 cc of Marcaine with epinephrine injected around the wound edges  The wound was irrigated and closed with 2-0 Monocryl suture in a running fashion followed by application of Dermabond. Sterile dressings were applied Ace wrap was applied tourniquet was released and good color the patient was taken to the outpatient suite for recovery  Plan discharge home  02/03/2013  11:58 AM  PATIENT:  Lauren [name]  79 y.o. female  PRE-OPERATIVE DIAGNOSIS:  Harriet Pho Disease (radial styloid tenosynovitis)  POST-OPERATIVE DIAGNOSIS:  Harriet Pho Disease (radial styloid tenosynovitis)  PROCEDURE:  Procedure(s): RELEASE DORSAL COMPARTMENT (DEQUERVAIN) (Left)  Findings the first extensor compartment had multiple slips especially of the EPB as a separate compartment for the APL there is significant synovial fluid and tenosynovitis  SURGEON:  Surgeon(s) and Role:    * Carole Civil, MD - Primary  PHYSICIAN ASSISTANT:   ASSISTANTS:  holly proztek   ANESTHESIA:   regional  EBL:  Total I/O In: 500 [I.V.:500] Out: -   BLOOD ADMINISTERED:none  DRAINS: none  LOCAL MEDICATIONS USED:  MARCAINE   , Amount: 14 ml and OTHER epi  SPECIMEN:  No Specimen  DISPOSITION OF SPECIMEN:  N/A  COUNTS:  YES  TOURNIQUET:   Total Tourniquet Time Documented: Upper Arm (Left) - 38 minutes Total: Upper Arm (Left) - 38 minutes   DICTATION: .Viviann Spare Dictation  PLAN OF CARE: Discharge to home after PACU  PATIENT DISPOSITION:  PACU - hemodynamically stable.   Delay start of Pharmacological VTE agent (>24hrs) due to surgical blood loss or risk of bleeding: not applicable

## 2013-02-03 NOTE — Progress Notes (Signed)
Pt continues to have nonsymptomic bradycardia.  Dr. Patsey Berthold  Aware.

## 2013-02-03 NOTE — Anesthesia Postprocedure Evaluation (Signed)
  Anesthesia Post-op Note  Patient: Lauren [name]  Procedure(s) Performed: Procedure(s): RELEASE DORSAL COMPARTMENT (DEQUERVAIN) (Left)  Patient Location: PACU  Anesthesia Type:Bier block  Level of Consciousness: awake, alert , oriented and patient cooperative  Airway and Oxygen Therapy: Patient Spontanous Breathing  Post-op Pain: 3 /10, mild  Post-op Assessment: Post-op Vital signs reviewed, Patient's Cardiovascular Status Stable, Respiratory Function Stable, Patent Airway, No signs of Nausea or vomiting and Pain level controlled  Post-op Vital Signs: Reviewed and stable  Complications: No apparent anesthesia complications

## 2013-02-03 NOTE — Interval H&P Note (Signed)
History and Physical Interval Note:  02/03/2013 10:25 AM  Lauren [name]  has presented today for surgery, with the diagnosis of Harriet Pho Disease (radial styloid tenosynovitis)  The various methods of treatment have been discussed with the patient and family. After consideration of risks, benefits and other options for treatment, the patient has consented to  Procedure(s): [place] (DEQUERVAIN) (Left) as a surgical intervention .  The patient's history has been reviewed, patient examined, no change in status, stable for surgery.  I have reviewed the patient's chart and labs.  Questions were answered to the patient's satisfaction.     Arther Abbott

## 2013-02-06 ENCOUNTER — Ambulatory Visit (INDEPENDENT_AMBULATORY_CARE_PROVIDER_SITE_OTHER): Payer: Medicare Other | Admitting: Orthopedic Surgery

## 2013-02-06 VITALS — BP 164/62 | Ht 63.0 in | Wt 181.0 lb

## 2013-02-06 DIAGNOSIS — L0291 Cutaneous abscess, unspecified: Secondary | ICD-10-CM

## 2013-02-06 DIAGNOSIS — L0390 Cellulitis, unspecified: Secondary | ICD-10-CM

## 2013-02-06 DIAGNOSIS — T7840XA Allergy, unspecified, initial encounter: Secondary | ICD-10-CM

## 2013-02-06 DIAGNOSIS — M654 Radial styloid tenosynovitis [de Quervain]: Secondary | ICD-10-CM

## 2013-02-06 MED ORDER — SULFAMETHOXAZOLE-TRIMETHOPRIM 800-160 MG PO TABS: 1.0000 | ORAL_TABLET | Freq: Two times a day (BID) | ORAL | Status: DC

## 2013-02-06 MED ORDER — DIPHENHYDRAMINE HCL 25 MG PO CAPS: 25.0000 mg | ORAL_CAPSULE | ORAL | Status: DC | PRN

## 2013-02-06 MED ORDER — HYDROCODONE-ACETAMINOPHEN 5-325 MG PO TABS: 1.0000 | ORAL_TABLET | ORAL | Status: DC | PRN

## 2013-02-06 NOTE — Progress Notes (Signed)
Patient ID: Lauren [name], female   DOB: 10/10/1934, 79 y.o.   MRN: [id number] Chief Complaint  Patient presents with  . Follow-up    Post op 1 left wrist surgery    The patient comes in today complaining of pain swelling and this in the operative site in hand  The exam shows that she has warmth and redness extending into her metacarpophalangeal joints and wrists 4 cm proximal to the wound the tenderness the wound has no drainage  Suspect differential diagnosis of  Allergy, initial encounter - Plan: diphenhydrAMINE (BENADRYL) 25 mg capsule  Cellulitis - Plan: sulfamethoxazole-trimethoprim (BACTRIM DS,SEPTRA DS) 800-160 MG per tablet  De Quervain's disease (radial styloid tenosynovitis) - Plan: HYDROcodone-acetaminophen (NORCO/VICODIN) 5-325 MG per tablet    Recommend followup on Thursday

## 2013-02-06 NOTE — Patient Instructions (Addendum)
Start antibiotic the allergy medication and the new pain med

## 2013-02-07 ENCOUNTER — Encounter (HOSPITAL_COMMUNITY): Payer: Self-pay | Admitting: Orthopedic Surgery

## 2013-02-09 ENCOUNTER — Ambulatory Visit (INDEPENDENT_AMBULATORY_CARE_PROVIDER_SITE_OTHER): Payer: Medicare Other | Admitting: Orthopedic Surgery

## 2013-02-09 VITALS — BP 120/58 | Ht 63.0 in | Wt 181.0 lb

## 2013-02-09 DIAGNOSIS — L03114 Cellulitis of left upper limb: Secondary | ICD-10-CM

## 2013-02-09 DIAGNOSIS — M654 Radial styloid tenosynovitis [de Quervain]: Secondary | ICD-10-CM

## 2013-02-09 DIAGNOSIS — L03119 Cellulitis of unspecified part of limb: Secondary | ICD-10-CM

## 2013-02-09 NOTE — Patient Instructions (Signed)
Continue antibiotics   Ice as needed

## 2013-02-09 NOTE — Progress Notes (Signed)
Patient ID: Lauren [name], female   DOB: 1934/09/15, 79 y.o.   MRN: [id number] Chief Complaint  Patient presents with  . Follow-up    3 day recheck on left wrist CTR. DOS 02-03-13.   BP 120/58  Ht 5\' 3"  (1.6 m)  Wt 181 lb (82.101 kg)  BMI 32.07 kg/m2  Assess post de Quervain's release left wrist  The patient has developed some type of reaction or infection was treated with Bactrim and Benadryl and has made significant improvement with decreased pain swelling and decreased redness  Wound remained clean dry and intact without drainage  Continue antibiotics wound check in one week

## 2013-02-16 ENCOUNTER — Encounter: Payer: Self-pay | Admitting: Orthopedic Surgery

## 2013-02-16 ENCOUNTER — Ambulatory Visit (INDEPENDENT_AMBULATORY_CARE_PROVIDER_SITE_OTHER): Payer: Medicare Other | Admitting: Orthopedic Surgery

## 2013-02-16 VITALS — BP 172/84 | Ht 63.0 in | Wt 181.0 lb

## 2013-02-16 DIAGNOSIS — M654 Radial styloid tenosynovitis [de Quervain]: Secondary | ICD-10-CM

## 2013-02-16 NOTE — Progress Notes (Signed)
Patient ID: Lauren [name], female   DOB: December 19, 1933, 78 y.o.   MRN: [id number] Chief Complaint  Patient presents with  . Follow-up    1 week recheck wound on left wrist. DOS 02-03-13.    A left wrist de Quervain's disease status post surgical release doing well wound is clean external suture is cut at the skin edge and looks nice.  New complaint pain right wrist over the thumb. Patient complains of similar pain and difficulty ulnar deviating the wrist on the right  No trauma  BP 172/84  Ht 5\' 3"  (1.6 m)  Wt 181 lb (82.101 kg)  BMI 32.07 kg/m2 General appearance is normal, the patient is alert and oriented x3 with normal mood and affect. There is tenderness and swelling and nodularity over the first extensor compartment of the right with no instability detected motor exam intact sensory exam normal normal range of motion  Impression de Quervain's tenosynovitis of the right wrist  Recommend thumb spica splint followup 6 weeks to recheck

## 2013-03-22 ENCOUNTER — Other Ambulatory Visit (HOSPITAL_COMMUNITY): Payer: Self-pay | Admitting: Pulmonary Disease

## 2013-03-22 ENCOUNTER — Ambulatory Visit (HOSPITAL_COMMUNITY)
Admission: RE | Admit: 2013-03-22 | Discharge: 2013-03-22 | Disposition: A | Discharge status: Home / Self Care | Payer: Medicare Other | Source: Ambulatory Visit | Attending: Pulmonary Disease | Admitting: Pulmonary Disease

## 2013-03-22 DIAGNOSIS — R911 Solitary pulmonary nodule: Secondary | ICD-10-CM | POA: Insufficient documentation

## 2013-03-22 DIAGNOSIS — R0602 Shortness of breath: Secondary | ICD-10-CM | POA: Insufficient documentation

## 2013-03-30 ENCOUNTER — Ambulatory Visit (INDEPENDENT_AMBULATORY_CARE_PROVIDER_SITE_OTHER): Payer: Medicare Other | Admitting: Orthopedic Surgery

## 2013-03-30 VITALS — BP 142/82 | Ht 63.0 in | Wt 181.0 lb

## 2013-03-30 DIAGNOSIS — M654 Radial styloid tenosynovitis [de Quervain]: Secondary | ICD-10-CM

## 2013-03-30 NOTE — Patient Instructions (Addendum)
Continue splint

## 2013-03-30 NOTE — Progress Notes (Signed)
Patient ID: Lauren [name], female   DOB: December 30, 1933, 79 y.o.   MRN: [id number] Chief Complaint  Patient presents with  . Follow-up    6 week recheck on right thumb.[ note left thumb dequervains release DOS 02-03-13.]    Left de Quervain's release doing well occasional pain only with extremes of movement  Right de Quervain's improve brace but cannot tolerate brace being off  Finkelstein's positive on the right  Continue brace  Return 2 months  Note patient having difficulty with breathing scheduled for CT scan no surgery could be done right now anyway, she is followed by Dr. Sinda Du

## 2013-04-03 ENCOUNTER — Other Ambulatory Visit (HOSPITAL_COMMUNITY): Payer: Self-pay | Admitting: Pulmonary Disease

## 2013-04-03 DIAGNOSIS — R0602 Shortness of breath: Secondary | ICD-10-CM

## 2013-04-05 ENCOUNTER — Ambulatory Visit (HOSPITAL_COMMUNITY)
Admission: RE | Admit: 2013-04-05 | Discharge: 2013-04-05 | Disposition: A | Discharge status: Home / Self Care | Payer: Medicare Other | Source: Ambulatory Visit | Attending: Pulmonary Disease | Admitting: Pulmonary Disease

## 2013-04-05 DIAGNOSIS — R0602 Shortness of breath: Secondary | ICD-10-CM

## 2013-04-05 DIAGNOSIS — R5381 Other malaise: Secondary | ICD-10-CM | POA: Insufficient documentation

## 2013-04-05 DIAGNOSIS — R918 Other nonspecific abnormal finding of lung field: Secondary | ICD-10-CM | POA: Insufficient documentation

## 2013-04-05 DIAGNOSIS — R5383 Other fatigue: Secondary | ICD-10-CM | POA: Insufficient documentation

## 2013-04-10 ENCOUNTER — Other Ambulatory Visit (HOSPITAL_COMMUNITY): Payer: Self-pay | Admitting: Pulmonary Disease

## 2013-04-10 DIAGNOSIS — IMO0002 Reserved for concepts with insufficient information to code with codable children: Secondary | ICD-10-CM

## 2013-04-11 ENCOUNTER — Ambulatory Visit (HOSPITAL_COMMUNITY)
Admission: RE | Admit: 2013-04-11 | Discharge: 2013-04-11 | Disposition: A | Discharge status: Home / Self Care | Payer: Medicare Other | Source: Ambulatory Visit | Attending: Pulmonary Disease | Admitting: Pulmonary Disease

## 2013-04-11 DIAGNOSIS — R1904 Left lower quadrant abdominal swelling, mass and lump: Secondary | ICD-10-CM | POA: Insufficient documentation

## 2013-04-11 DIAGNOSIS — IMO0002 Reserved for concepts with insufficient information to code with codable children: Secondary | ICD-10-CM

## 2013-04-25 ENCOUNTER — Other Ambulatory Visit (HOSPITAL_COMMUNITY): Payer: Self-pay | Admitting: Pulmonary Disease

## 2013-04-25 DIAGNOSIS — D179 Benign lipomatous neoplasm, unspecified: Secondary | ICD-10-CM

## 2013-04-27 ENCOUNTER — Ambulatory Visit (HOSPITAL_COMMUNITY)
Admission: RE | Admit: 2013-04-27 | Discharge: 2013-04-27 | Disposition: A | Discharge status: Home / Self Care | Payer: Medicare Other | Source: Ambulatory Visit | Attending: Pulmonary Disease | Admitting: Pulmonary Disease

## 2013-04-27 ENCOUNTER — Encounter (HOSPITAL_COMMUNITY): Payer: Self-pay

## 2013-04-27 DIAGNOSIS — R1032 Left lower quadrant pain: Secondary | ICD-10-CM | POA: Insufficient documentation

## 2013-04-27 DIAGNOSIS — R1904 Left lower quadrant abdominal swelling, mass and lump: Secondary | ICD-10-CM | POA: Insufficient documentation

## 2013-04-27 DIAGNOSIS — D179 Benign lipomatous neoplasm, unspecified: Secondary | ICD-10-CM

## 2013-04-27 LAB — POCT I-STAT, CHEM 8
BUN: 23 mg/dL (ref 6–23)
Calcium, Ion: 1.2 mmol/L (ref 1.13–1.30)
Chloride: 107 mEq/L (ref 96–112)
Creatinine, Ser: 0.7 mg/dL (ref 0.50–1.10)
Glucose, Bld: 96 mg/dL (ref 70–99)
HCT: 32 % — ABNORMAL LOW (ref 36.0–46.0)
Hemoglobin: 10.9 g/dL — ABNORMAL LOW (ref 12.0–15.0)
Potassium: 4.6 mEq/L (ref 3.5–5.1)
Sodium: 143 mEq/L (ref 135–145)
TCO2: 29 mmol/L (ref 0–100)

## 2013-04-27 MED ORDER — GADOBENATE DIMEGLUMINE 529 MG/ML IV SOLN
15.0000 mL | Freq: Once | INTRAVENOUS | Status: AC | PRN
  Administered 2013-04-27: 15 mL via INTRAVENOUS

## 2013-04-27 NOTE — Progress Notes (Signed)
Blood sample obtained from right arm IV for Creatnine level.  

## 2013-05-15 ENCOUNTER — Other Ambulatory Visit (HOSPITAL_COMMUNITY): Payer: Self-pay | Admitting: Pulmonary Disease

## 2013-05-15 DIAGNOSIS — E0590 Thyrotoxicosis, unspecified without thyrotoxic crisis or storm: Secondary | ICD-10-CM

## 2013-05-18 ENCOUNTER — Ambulatory Visit (HOSPITAL_COMMUNITY)
Admission: RE | Admit: 2013-05-18 | Discharge: 2013-05-18 | Disposition: A | Discharge status: Home / Self Care | Payer: Medicare Other | Source: Ambulatory Visit | Attending: Pulmonary Disease | Admitting: Pulmonary Disease

## 2013-05-18 DIAGNOSIS — E0590 Thyrotoxicosis, unspecified without thyrotoxic crisis or storm: Secondary | ICD-10-CM

## 2013-05-18 DIAGNOSIS — E0510 Thyrotoxicosis with toxic single thyroid nodule without thyrotoxic crisis or storm: Secondary | ICD-10-CM | POA: Insufficient documentation

## 2013-05-30 ENCOUNTER — Encounter: Payer: Self-pay | Admitting: Orthopedic Surgery

## 2013-05-30 ENCOUNTER — Ambulatory Visit (INDEPENDENT_AMBULATORY_CARE_PROVIDER_SITE_OTHER): Payer: Medicare Other | Admitting: Orthopedic Surgery

## 2013-05-30 VITALS — BP 140/84 | Ht 63.0 in | Wt 181.0 lb

## 2013-05-30 DIAGNOSIS — M654 Radial styloid tenosynovitis [de Quervain]: Secondary | ICD-10-CM

## 2013-05-30 NOTE — Progress Notes (Signed)
Patient ID: Lauren [name], female   DOB: 1934-04-15, 79 y.o.   MRN: [id number] Chief Complaint  Patient presents with  . Follow-up    2 month follow up Right DeQuverin's syndrome   BP 140/84  Ht 5\' 3"  (1.6 m)  Wt 181 lb (82.101 kg)  BMI 32.07 kg/m2  Right wrist pain from de Quervain's syndrome  Patient says her wrist is a little sore but improved with bracing and current treatment  She's having issues with her thyroid gland and thought to be hyperthyroid and workup is in progress.  No other symptoms from the musculoskeletal system  BP 140/84  Ht 5\' 3"  (1.6 m)  Wt 181 lb (82.101 kg)  BMI 32.07 kg/m2 General appearance is normal, the patient is alert and oriented x3 with normal mood and affect. Mild tenderness with a negative Finkelstein's test. Slight pain with wrist flexion but wrist motion is normal  Neurovascular exam normal  Controlled de Quervain's syndrome. Continue splinting and followup with [place] as needed

## 2013-05-30 NOTE — Patient Instructions (Addendum)
Brace as needed

## 2013-06-13 ENCOUNTER — Other Ambulatory Visit (HOSPITAL_COMMUNITY): Payer: Self-pay | Admitting: "Endocrinology

## 2013-06-13 DIAGNOSIS — E0590 Thyrotoxicosis, unspecified without thyrotoxic crisis or storm: Secondary | ICD-10-CM

## 2013-06-15 ENCOUNTER — Encounter (HOSPITAL_COMMUNITY): Payer: Medicare Other

## 2013-06-16 ENCOUNTER — Encounter (HOSPITAL_COMMUNITY): Payer: Medicare Other

## 2013-06-20 ENCOUNTER — Encounter (HOSPITAL_COMMUNITY): Payer: Self-pay

## 2013-06-20 ENCOUNTER — Encounter (HOSPITAL_COMMUNITY)
Admission: RE | Admit: 2013-06-20 | Discharge: 2013-06-20 | Disposition: A | Discharge status: Home / Self Care | Payer: Medicare Other | Source: Ambulatory Visit | Attending: "Endocrinology | Admitting: "Endocrinology

## 2013-06-20 DIAGNOSIS — E0590 Thyrotoxicosis, unspecified without thyrotoxic crisis or storm: Secondary | ICD-10-CM | POA: Insufficient documentation

## 2013-06-20 DIAGNOSIS — E049 Nontoxic goiter, unspecified: Secondary | ICD-10-CM | POA: Insufficient documentation

## 2013-06-20 MED ORDER — SODIUM IODIDE I 131 CAPSULE
17.0000 | Freq: Once | INTRAVENOUS | Status: AC | PRN
  Administered 2013-06-20: 17 via ORAL

## 2013-06-21 ENCOUNTER — Encounter (HOSPITAL_COMMUNITY)
Admission: RE | Admit: 2013-06-21 | Discharge: 2013-06-21 | Disposition: A | Discharge status: Home / Self Care | Payer: Medicare Other | Source: Ambulatory Visit | Attending: "Endocrinology | Admitting: "Endocrinology

## 2013-06-21 ENCOUNTER — Encounter (HOSPITAL_COMMUNITY): Payer: Self-pay

## 2013-06-21 MED ORDER — SODIUM PERTECHNETATE TC 99M INJECTION
10.0000 | Freq: Once | INTRAVENOUS | Status: AC | PRN
  Administered 2013-06-21: 10 via INTRAVENOUS

## 2013-06-23 ENCOUNTER — Other Ambulatory Visit (HOSPITAL_COMMUNITY): Payer: Self-pay | Admitting: "Endocrinology

## 2013-06-23 DIAGNOSIS — E0590 Thyrotoxicosis, unspecified without thyrotoxic crisis or storm: Secondary | ICD-10-CM

## 2013-06-30 ENCOUNTER — Encounter (HOSPITAL_COMMUNITY)
Admission: RE | Admit: 2013-06-30 | Discharge: 2013-06-30 | Disposition: A | Discharge status: Home / Self Care | Payer: Medicare Other | Source: Ambulatory Visit | Attending: "Endocrinology | Admitting: "Endocrinology

## 2013-06-30 ENCOUNTER — Encounter (HOSPITAL_COMMUNITY): Payer: Self-pay

## 2013-06-30 DIAGNOSIS — E0590 Thyrotoxicosis, unspecified without thyrotoxic crisis or storm: Secondary | ICD-10-CM | POA: Insufficient documentation

## 2013-06-30 MED ORDER — SODIUM IODIDE I 131 CAPSULE
15.0000 | Freq: Once | INTRAVENOUS | Status: AC | PRN
  Administered 2013-06-30: 15 via ORAL

## 2013-08-02 ENCOUNTER — Ambulatory Visit (INDEPENDENT_AMBULATORY_CARE_PROVIDER_SITE_OTHER): Payer: Medicare Other

## 2013-08-02 ENCOUNTER — Ambulatory Visit (INDEPENDENT_AMBULATORY_CARE_PROVIDER_SITE_OTHER): Payer: Medicare Other | Admitting: Orthopedic Surgery

## 2013-08-02 VITALS — BP 192/99 | Ht 63.0 in | Wt 181.0 lb

## 2013-08-02 DIAGNOSIS — M79644 Pain in right finger(s): Secondary | ICD-10-CM

## 2013-08-02 DIAGNOSIS — M79609 Pain in unspecified limb: Secondary | ICD-10-CM

## 2013-08-02 DIAGNOSIS — IMO0002 Reserved for concepts with insufficient information to code with codable children: Secondary | ICD-10-CM

## 2013-08-02 DIAGNOSIS — M25532 Pain in left wrist: Secondary | ICD-10-CM

## 2013-08-02 DIAGNOSIS — S63612A Unspecified sprain of right middle finger, initial encounter: Secondary | ICD-10-CM

## 2013-08-02 DIAGNOSIS — M25539 Pain in unspecified wrist: Secondary | ICD-10-CM

## 2013-08-02 DIAGNOSIS — S52599A Other fractures of lower end of unspecified radius, initial encounter for closed fracture: Secondary | ICD-10-CM

## 2013-08-02 DIAGNOSIS — S52502A Unspecified fracture of the lower end of left radius, initial encounter for closed fracture: Secondary | ICD-10-CM

## 2013-08-02 MED ORDER — HYDROCODONE-ACETAMINOPHEN 5-325 MG PO TABS: 1.0000 | ORAL_TABLET | Freq: Four times a day (QID) | ORAL | Status: DC | PRN

## 2013-08-02 NOTE — Patient Instructions (Signed)
Hairline fracture left wrist

## 2013-08-03 ENCOUNTER — Encounter: Payer: Self-pay | Admitting: Orthopedic Surgery

## 2013-08-03 DIAGNOSIS — M79646 Pain in unspecified finger(s): Secondary | ICD-10-CM | POA: Insufficient documentation

## 2013-08-03 DIAGNOSIS — S63612A Unspecified sprain of right middle finger, initial encounter: Secondary | ICD-10-CM | POA: Insufficient documentation

## 2013-08-03 DIAGNOSIS — S52509A Unspecified fracture of the lower end of unspecified radius, initial encounter for closed fracture: Secondary | ICD-10-CM | POA: Insufficient documentation

## 2013-08-03 DIAGNOSIS — M25532 Pain in left wrist: Secondary | ICD-10-CM | POA: Insufficient documentation

## 2013-08-03 NOTE — Progress Notes (Signed)
Patient ID: Lauren [name], female   DOB: November 12, 1934, 79 y.o.   MRN: [id number]  Chief Complaint  Patient presents with  . Pain    Left wrist pain and right long finger pain d/t injury 07/31/13    HISTORY:  79 year old female with frequent falls recently currently being evaluated by her primary care physician presents after falling on September 9 injuring her right finger and left wrist. She complains of sharp left wrist pain which is constant and unrelieved by ice elevation and immobilization and associated with swelling and loss of motion. She is deformity of the right long finger which had symptoms of triggering but the swelling at the PIP joint worsened after her fall. She denied any other symptoms other review of systems  She is allergic to penicillin she has hypertension and thyroid disease she had left wrist surgery with a de Quervain's release she has a family history of arthritis and asthma. She is divorced she does not smoke or drink  BP 192/99  Ht 5\' 3"  (1.6 m)  Wt 181 lb (82.101 kg)  BMI 32.07 kg/m2  Her appearance is normal she is slightly overweight she is oriented x3 her mood and affect are flat she ambulates with a slightly decreased stride length and cadence. Her right finger is swollen and tender at the PIP joint and has a flexion contracture there is no instability no crepitus skin is normal good capillary refill is noted and normal sensation there is no epitrochlear lymph nodes on that side  As far as the left wrist though she is tender over the distal radius on the radial side chest swelling and decreased range of motion. The wrist is stable the motor function is normal there is no atrophy scans intact has good radial pulse normal sensation and excellent capillary refill with no epitrochlear lymph nodes  X-rays of the right finger show no abnormality in the x-ray of the left wrist shows a hairline fracture in the distal radius which goes into the  joint.  Impression Encounter Diagnoses  Name Primary?  . Left wrist pain Yes  . Finger pain, right   . Distal radius fracture, left, closed, initial encounter   . Sprain of right middle finger, initial encounter     Plan left wrist splint  Followup in 4 weeks reexamination clinically

## 2013-08-04 ENCOUNTER — Other Ambulatory Visit: Payer: Self-pay | Admitting: Gastroenterology

## 2013-08-07 ENCOUNTER — Other Ambulatory Visit (HOSPITAL_COMMUNITY): Payer: Self-pay | Admitting: General Surgery

## 2013-08-07 DIAGNOSIS — Z09 Encounter for follow-up examination after completed treatment for conditions other than malignant neoplasm: Secondary | ICD-10-CM

## 2013-08-31 ENCOUNTER — Ambulatory Visit (INDEPENDENT_AMBULATORY_CARE_PROVIDER_SITE_OTHER): Payer: Self-pay | Admitting: Orthopedic Surgery

## 2013-08-31 VITALS — BP 126/60 | Ht 63.0 in | Wt 181.0 lb

## 2013-08-31 DIAGNOSIS — M654 Radial styloid tenosynovitis [de Quervain]: Secondary | ICD-10-CM

## 2013-08-31 DIAGNOSIS — M6530 Trigger finger, unspecified finger: Secondary | ICD-10-CM

## 2013-08-31 DIAGNOSIS — M25539 Pain in unspecified wrist: Secondary | ICD-10-CM

## 2013-08-31 DIAGNOSIS — M25532 Pain in left wrist: Secondary | ICD-10-CM

## 2013-08-31 MED ORDER — HYDROCODONE-ACETAMINOPHEN 5-325 MG PO TABS: 1.0000 | ORAL_TABLET | Freq: Four times a day (QID) | ORAL | Status: DC | PRN

## 2013-08-31 NOTE — Patient Instructions (Addendum)
Surgery: Dequervains left thumb and Trigger release right long finger   Brace off left wrist

## 2013-09-02 ENCOUNTER — Encounter: Payer: Self-pay | Admitting: Orthopedic Surgery

## 2013-09-02 DIAGNOSIS — M654 Radial styloid tenosynovitis [de Quervain]: Secondary | ICD-10-CM | POA: Insufficient documentation

## 2013-09-02 DIAGNOSIS — M6530 Trigger finger, unspecified finger: Secondary | ICD-10-CM | POA: Insufficient documentation

## 2013-09-02 NOTE — Progress Notes (Signed)
Patient ID: Lauren [name], female   DOB: 1933-12-27, 79 y.o.   MRN: [id number]  Chief Complaint  Patient presents with  . Follow-up    4 week recheck left wrist fracture DOI 08/02/13    Recheck left wrist possible fracture and confirmed by x-ray treated with splint patient did well symptoms have resolved  Patient complains of pain over the radial aspect of the right wrist in the first compartment associated with catching locking intrauterine of the long  Finger.  Review of systems no numbness or tingling no weakness  BP 126/60  Ht 5\' 3"  (1.6 m)  Wt 181 lb (82.101 kg)  BMI 32.07 kg/m2 General appearance is normal, the patient is alert and oriented x3 with normal mood and affect. The left wrist is nontender and she's regained full range of motion in grip strength in the hand  The right wrist is tender over the first compartment she has painful or deviation decreased range of motion decreased pinch strength in the hand between the thumb and index finger catching locking and triggering of the right long finger with tenderness over the A1 pulley and intact flexor tendon function normal neurovascular exam  Impression Encounter Diagnoses  Name Primary?  . Left wrist pain Yes  . Trigger finger, acquired   . De Quervain's syndrome (tenosynovitis)     We discussed possible treatment options the patient would like to have this surgically corrected if possible. We explained the risks and benefits of the procedure she is in such discomfort that she has agreed to accept these risks and proceed with surgical treatment with a trigger finger release and a release of the first extensor compartment for de Quervain's syndrome  Will arrange this at her convenience.

## 2013-09-04 ENCOUNTER — Encounter (HOSPITAL_COMMUNITY): Payer: Self-pay | Admitting: Pharmacy Technician

## 2013-09-04 ENCOUNTER — Other Ambulatory Visit: Payer: Self-pay | Admitting: *Deleted

## 2013-09-04 ENCOUNTER — Telehealth: Payer: Self-pay | Admitting: Orthopedic Surgery

## 2013-09-04 NOTE — Telephone Encounter (Signed)
Per phone call to insurer, [employer]. And per representative Heidi O. on 09/04/13 4:50 PM, Pre-authorization required for outpatient surgery CPT 26055 and 25000.  Approval # [id number]. If anything else is needed, the insurer will be in contact.

## 2013-09-05 NOTE — Patient Instructions (Signed)
Lauren [name]  09/05/2013   Your procedure is scheduled on:  09/11/13  Report to [facility] at 11:00 AM.  Call this number if you have problems the morning of surgery: [phone]   Remember:   Do not eat food or drink liquids after midnight.   Take these medicines the morning of surgery with A SIP OF WATER: LIsinopril-HCTZ and Oxybutynin. You make take your Hydrocodone and Xanax if needed.   Do not wear jewelry, make-up or nail polish.  Do not wear lotions, powders, or perfumes.   Do not shave 48 hours prior to surgery. Men may shave face and neck.  Do not bring valuables to the hospital.  [facility] is not responsible for any belongings or valuables.               Contacts, dentures or bridgework may not be worn into surgery.  Leave suitcase in the car. After surgery it may be brought to your room.  For patients admitted to the hospital, discharge time is determined by your treatment team.               Patients discharged the day of surgery will not be allowed to drive home.    Special Instructions: Shower using CHG 2 nights before surgery and the night before surgery.  If you shower the day of surgery use CHG.  Use special wash - you have one bottle of CHG for all showers.  You should use approximately 1/3 of the bottle for each shower.   Please read over the following fact sheets that you were given: Pain Booklet, Surgical Site Infection Prevention, Anesthesia Post-op Instructions and Care and Recovery After Surgery    Trigger Finger Trigger finger (digital tendinitis and stenosing tenosynovitis) is a common disorder that causes an often painful catching of the fingers or thumb. It occurs as a clicking, snapping or locking of a finger in the palm of the hand. The reason for this is that there is a problem with the tendons which flex the fingers sliding smoothly through their sheaths. The cause of this may be inflammation of the tendon and sheath, or from a thickening or nodule  in the tendon. The condition may occur in any finger or a couple fingers at the same time. The cause may be overuse while doing the same activity over and over again with your hands.  Tendons are the tough cords that connect the muscles to bones. Muscles and tendons are part of the system which allows your body to move. When muscles contract in the forearm on the palm side, they pull the tendons toward the elbow and cause the fingers and thumb to bend (flex) toward the palm. These are the flexor tendons. The tendons slide through a slippery smooth membrane (synovium) which is called the tendon sheath. The sheaths have areas of tough fibrous tissues surrounding them which hold the tendons close to the bone. These are called pulleys because they work like a pulley. The first pulley is in the palm of the hand near the crease which runs across your palm. If the area of the tendon thickening is near the pulley, the tendon cannot slide smoothly through the pulley and this causes the trigger finger. The finger may lock with the finger curled or suddenly straighten out with a snap. This is more common in patients with rheumatoid arthritis and diabetes. Left untreated, the condition may get worse to the point where the finger becomes locked in flexion, like  making a fist, or less commonly locked with the finger straightened out. DIAGNOSIS  Your caregiver will easily make this diagnosis on examination. TREATMENT   Splinting for 6 to 8 weeks of time may be helpful. Use the splints as your caregiver suggests.  Heat used for twenty minutes at least four times a day followed by ice packs for twenty minutes unless directed otherwise by your caregiver may be helpful. If you find either heat or cold seems to be making the problem worse, quit using them and ask your caregiver for directions.  Cortisone injections along with splinting may speed up recovery. Several injections may be required. Cortisone may give relief after  one injection.  Only take over-the-counter or prescription medicines for pain, discomfort, or fever as directed by your caregiver.  Surgery is another treatment that may be used if conservative treatments using injection and splinting does not work. Surgery can be minor without incisions (a cut does not have to be made) and can be done with a needle through the skin. No stitches are needed and most patients may return to work the same day.  Other surgical choices involve an open procedure where the surgeon opens the hand through a small incision (cut) and cuts the pulley so the tendon can again slide smoothly. Your hand will still work fine. This small operation requires stitches and the recovery will be a little longer and the incisions will need to be protected until completely healed. You may have to limit your activities for up to 6 months.  Occupational or hand therapy may be required if there is stiffness remaining in the finger. RISKS AND COMPLICATIONS Complications are uncommon but some problems that may occur are:  Recurrence of the trigger finger. This does not mean that the surgery was not well done. It simply means that you may have formed scar tissue following surgery that causes the problem to reoccur.  Infection which could ruin the results of the surgery and can result in a finger which is frozen and can not move normally.  Nerve injury is possible which could result in permanent numbness of one or more fingers. CARE AFTER SURGERY  Elevate your hand above your heart and use ice as instructed.  Follow instructions regarding finger motion/exercise.  Keep the surgical wound dry for at least 48 hrs or longer if instructed.  Keep your follow-up appointments.  Return to work and normal activities as instructed. SEEK IMMEDIATE MEDICAL CARE IF:  Your problems are getting worse or you do not obtain relief from the treatment. Document Released: 08/29/2004 Document Revised:  02/01/2012 Document Reviewed: 04/23/2009 [facility] Patient Information 2014 [place], [place].    PATIENT INSTRUCTIONS POST-ANESTHESIA  IMMEDIATELY FOLLOWING SURGERY:  Do not drive or operate machinery for the first twenty four hours after surgery.  Do not make any important decisions for twenty four hours after surgery or while taking narcotic pain medications or sedatives.  If you develop intractable nausea and vomiting or a severe headache please notify your doctor immediately.  FOLLOW-UP:  Please make an appointment with your surgeon as instructed. You do not need to follow up with anesthesia unless specifically instructed to do so.  WOUND CARE INSTRUCTIONS (if applicable):  Keep a dry clean dressing on the anesthesia/puncture wound site if there is drainage.  Once the wound has quit draining you may leave it open to air.  Generally you should leave the bandage intact for twenty four hours unless there is drainage.  If the epidural site  drains for more than 36-48 hours please call the anesthesia department.  QUESTIONS?:  Please feel free to call your physician or the hospital operator if you have any questions, and they will be happy to assist you.

## 2013-09-06 ENCOUNTER — Telehealth: Payer: Self-pay | Admitting: Orthopedic Surgery

## 2013-09-06 ENCOUNTER — Encounter (HOSPITAL_COMMUNITY)
Admission: RE | Admit: 2013-09-06 | Discharge: 2013-09-06 | Disposition: A | Discharge status: Home / Self Care | Payer: Medicare Other | Source: Ambulatory Visit | Attending: Orthopedic Surgery | Admitting: Orthopedic Surgery

## 2013-09-06 ENCOUNTER — Encounter (HOSPITAL_COMMUNITY): Payer: Self-pay

## 2013-09-06 DIAGNOSIS — Z01812 Encounter for preprocedural laboratory examination: Secondary | ICD-10-CM | POA: Insufficient documentation

## 2013-09-06 LAB — CBC
HCT: 36.5 % (ref 36.0–46.0)
Hemoglobin: 12.4 g/dL (ref 12.0–15.0)
MCH: 29 pg (ref 26.0–34.0)
MCHC: 34 g/dL (ref 30.0–36.0)
MCV: 85.5 fL (ref 78.0–100.0)
Platelets: 246 10*3/uL (ref 150–400)
RBC: 4.27 MIL/uL (ref 3.87–5.11)
RDW: 13.7 % (ref 11.5–15.5)
WBC: 8.3 10*3/uL (ref 4.0–10.5)

## 2013-09-06 LAB — BASIC METABOLIC PANEL
BUN: 24 mg/dL — ABNORMAL HIGH (ref 6–23)
CO2: 27 mEq/L (ref 19–32)
Calcium: 10.3 mg/dL (ref 8.4–10.5)
Chloride: 94 mEq/L — ABNORMAL LOW (ref 96–112)
Creatinine, Ser: 0.98 mg/dL (ref 0.50–1.10)
GFR calc Af Amer: 63 mL/min — ABNORMAL LOW (ref >90)
GFR calc non Af Amer: 55 mL/min — ABNORMAL LOW (ref >90)
Glucose, Bld: 100 mg/dL — ABNORMAL HIGH (ref 70–99)
Potassium: 4.3 mEq/L (ref 3.5–5.1)
Sodium: 133 mEq/L — ABNORMAL LOW (ref 135–145)

## 2013-09-06 NOTE — Telephone Encounter (Signed)
No additional note

## 2013-09-06 NOTE — Telephone Encounter (Signed)
Call received from [facility], [facility], ph# [phone] - states patient's pre-op visit was done today.  States needs to verify if all procedure(s) are to be done on right hand?  States this is what patient relayed.  Please advise - above phone # for [facility]; patient ph# [phone].

## 2013-09-07 NOTE — Telephone Encounter (Signed)
Called Day Surgery and spoke with Pam and changed the Dequervain's thumb to right thumb. She states she will have patient sign new consent on Monday, and I changed it with Ladona Horns in surgery scheduling.

## 2013-09-11 ENCOUNTER — Ambulatory Visit: Payer: Medicare Other | Admitting: Orthopedic Surgery

## 2013-09-11 ENCOUNTER — Encounter (HOSPITAL_COMMUNITY)
Admission: RE | Disposition: A | Discharge status: Home / Self Care | Payer: Self-pay | Source: Ambulatory Visit | Attending: Orthopedic Surgery

## 2013-09-11 ENCOUNTER — Ambulatory Visit (HOSPITAL_COMMUNITY)
Admission: RE | Admit: 2013-09-11 | Discharge: 2013-09-11 | Disposition: A | Discharge status: Home / Self Care | Payer: Medicare Other | Source: Ambulatory Visit | Attending: Orthopedic Surgery | Admitting: Orthopedic Surgery

## 2013-09-11 ENCOUNTER — Encounter (HOSPITAL_COMMUNITY): Payer: Medicare Other | Admitting: Anesthesiology

## 2013-09-11 ENCOUNTER — Encounter (HOSPITAL_COMMUNITY): Payer: Self-pay | Admitting: *Deleted

## 2013-09-11 ENCOUNTER — Ambulatory Visit (HOSPITAL_COMMUNITY): Payer: Medicare Other | Admitting: Anesthesiology

## 2013-09-11 DIAGNOSIS — E3450 Androgen insensitivity syndrome, unspecified: Secondary | ICD-10-CM | POA: Insufficient documentation

## 2013-09-11 DIAGNOSIS — J4489 Other specified chronic obstructive pulmonary disease: Secondary | ICD-10-CM | POA: Insufficient documentation

## 2013-09-11 DIAGNOSIS — I10 Essential (primary) hypertension: Secondary | ICD-10-CM | POA: Insufficient documentation

## 2013-09-11 DIAGNOSIS — M6530 Trigger finger, unspecified finger: Secondary | ICD-10-CM | POA: Insufficient documentation

## 2013-09-11 DIAGNOSIS — M25532 Pain in left wrist: Secondary | ICD-10-CM

## 2013-09-11 DIAGNOSIS — M65839 Other synovitis and tenosynovitis, unspecified forearm: Secondary | ICD-10-CM | POA: Insufficient documentation

## 2013-09-11 DIAGNOSIS — J449 Chronic obstructive pulmonary disease, unspecified: Secondary | ICD-10-CM | POA: Insufficient documentation

## 2013-09-11 HISTORY — PX: TRIGGER FINGER RELEASE: SHX641

## 2013-09-11 HISTORY — PX: DORSAL COMPARTMENT RELEASE: SHX5039

## 2013-09-11 SURGERY — RELEASE, A1 PULLEY, FOR TRIGGER FINGER
Anesthesia: Monitor Anesthesia Care | Site: Thumb | Laterality: Right | Wound class: Clean

## 2013-09-11 MED ORDER — LIDOCAINE HCL (PF) 0.5 % IJ SOLN
INTRAMUSCULAR | Status: DC | PRN
  Administered 2013-09-11: 250 mg via INTRAVENOUS

## 2013-09-11 MED ORDER — VANCOMYCIN HCL IN DEXTROSE 1-5 GM/200ML-% IV SOLN
1000.0000 mg | INTRAVENOUS | Status: AC
  Administered 2013-09-11: 1000 mg via INTRAVENOUS

## 2013-09-11 MED ORDER — BUPIVACAINE HCL (PF) 0.5 % IJ SOLN
INTRAMUSCULAR | Status: AC
  Filled 2013-09-11: qty 30

## 2013-09-11 MED ORDER — SODIUM CHLORIDE 0.9 % IR SOLN
Status: DC | PRN
  Administered 2013-09-11: 500 mL

## 2013-09-11 MED ORDER — CHLORHEXIDINE GLUCONATE 4 % EX LIQD: 60.0000 mL | Freq: Once | CUTANEOUS | Status: DC

## 2013-09-11 MED ORDER — VANCOMYCIN HCL IN DEXTROSE 1-5 GM/200ML-% IV SOLN
INTRAVENOUS | Status: AC
  Filled 2013-09-11: qty 200

## 2013-09-11 MED ORDER — HYDROCODONE-ACETAMINOPHEN 5-325 MG PO TABS: 1.0000 | ORAL_TABLET | Freq: Four times a day (QID) | ORAL | Status: DC | PRN

## 2013-09-11 MED ORDER — PROPOFOL 10 MG/ML IV BOLUS
INTRAVENOUS | Status: AC
  Filled 2013-09-11: qty 20

## 2013-09-11 MED ORDER — MIDAZOLAM HCL 5 MG/5ML IJ SOLN
INTRAMUSCULAR | Status: DC | PRN
  Administered 2013-09-11: 2 mg via INTRAVENOUS

## 2013-09-11 MED ORDER — DEXTROSE 5 % IV SOLN
INTRAVENOUS | Status: DC | PRN
  Administered 2013-09-11: 13:00:00 via INTRAVENOUS

## 2013-09-11 MED ORDER — SODIUM CHLORIDE 0.9 % IJ SOLN
INTRAMUSCULAR | Status: AC
  Filled 2013-09-11: qty 10

## 2013-09-11 MED ORDER — FENTANYL CITRATE 0.05 MG/ML IJ SOLN
INTRAMUSCULAR | Status: AC
  Filled 2013-09-11: qty 2

## 2013-09-11 MED ORDER — LACTATED RINGERS IV SOLN
INTRAVENOUS | Status: DC | PRN
  Administered 2013-09-11: 11:00:00 via INTRAVENOUS

## 2013-09-11 MED ORDER — PROPOFOL INFUSION 10 MG/ML OPTIME
INTRAVENOUS | Status: DC | PRN
  Administered 2013-09-11: 35 ug/kg/min via INTRAVENOUS
  Administered 2013-09-11: 13:00:00 via INTRAVENOUS

## 2013-09-11 MED ORDER — MIDAZOLAM HCL 2 MG/2ML IJ SOLN
INTRAMUSCULAR | Status: AC
  Filled 2013-09-11: qty 2

## 2013-09-11 MED ORDER — LIDOCAINE HCL (PF) 0.5 % IJ SOLN
INTRAMUSCULAR | Status: AC
  Filled 2013-09-11: qty 50

## 2013-09-11 MED ORDER — FENTANYL CITRATE 0.05 MG/ML IJ SOLN
INTRAMUSCULAR | Status: DC | PRN
  Administered 2013-09-11 (×2): 25 ug via INTRAVENOUS

## 2013-09-11 SURGICAL SUPPLY — 44 items
ADH SKN CLS APL DERMABOND .7 (GAUZE/BANDAGES/DRESSINGS)
BAG HAMPER (MISCELLANEOUS) ×3 IMPLANT
BANDAGE CONFORM 2  STR LF (GAUZE/BANDAGES/DRESSINGS) IMPLANT
BANDAGE ELASTIC 2 VELCRO NS LF (GAUZE/BANDAGES/DRESSINGS) ×1 IMPLANT
BANDAGE ELASTIC 3 VELCRO ST LF (GAUZE/BANDAGES/DRESSINGS) ×3 IMPLANT
BANDAGE ESMARK 4X12 BL STRL LF (DISPOSABLE) ×2 IMPLANT
BANDAGE GAUZE ELAST BULKY 4 IN (GAUZE/BANDAGES/DRESSINGS) ×1 IMPLANT
BNDG CMPR 12X4 ELC STRL LF (DISPOSABLE) ×2
BNDG COHESIVE 4X5 TAN NS LF (GAUZE/BANDAGES/DRESSINGS) ×2 IMPLANT
BNDG ESMARK 4X12 BLUE STRL LF (DISPOSABLE) ×3
CHLORAPREP W/TINT 26ML (MISCELLANEOUS) ×3 IMPLANT
CLOTH BEACON ORANGE TIMEOUT ST (SAFETY) ×3 IMPLANT
COVER LIGHT HANDLE STERIS (MISCELLANEOUS) ×6 IMPLANT
CUFF TOURNIQUET SINGLE 18IN (TOURNIQUET CUFF) ×3 IMPLANT
CUFF TOURNIQUET SINGLE 24IN (TOURNIQUET CUFF) IMPLANT
DECANTER SPIKE VIAL GLASS SM (MISCELLANEOUS) ×3 IMPLANT
DERMABOND ADVANCED (GAUZE/BANDAGES/DRESSINGS)
DERMABOND ADVANCED .7 DNX12 (GAUZE/BANDAGES/DRESSINGS) ×2 IMPLANT
DRSG XEROFORM 1X8 (GAUZE/BANDAGES/DRESSINGS) ×1 IMPLANT
ELECT NDL TIP 2.8 STRL (NEEDLE) ×2 IMPLANT
ELECT NEEDLE TIP 2.8 STRL (NEEDLE) ×3 IMPLANT
ELECT REM PT RETURN 9FT ADLT (ELECTROSURGICAL) ×3
ELECTRODE REM PT RTRN 9FT ADLT (ELECTROSURGICAL) ×2 IMPLANT
GLOVE SKINSENSE NS SZ8.0 LF (GLOVE) ×1
GLOVE SKINSENSE STRL SZ8.0 LF (GLOVE) ×2 IMPLANT
GLOVE SS N UNI LF 8.5 STRL (GLOVE) ×3 IMPLANT
GOWN STRL REIN XL XLG (GOWN DISPOSABLE) ×8 IMPLANT
HAND ALUMI XLG (SOFTGOODS) ×3 IMPLANT
INST SET MINOR BONE (KITS) ×3 IMPLANT
KIT ROOM TURNOVER APOR (KITS) ×3 IMPLANT
MANIFOLD NEPTUNE II (INSTRUMENTS) ×3 IMPLANT
NDL HYPO 21X1.5 SAFETY (NEEDLE) ×2 IMPLANT
NEEDLE HYPO 21X1.5 SAFETY (NEEDLE) ×3 IMPLANT
NS IRRIG 1000ML POUR BTL (IV SOLUTION) ×3 IMPLANT
PACK BASIC LIMB (CUSTOM PROCEDURE TRAY) ×3 IMPLANT
PAD ARMBOARD 7.5X6 YLW CONV (MISCELLANEOUS) ×3 IMPLANT
SET BASIN LINEN APH (SET/KITS/TRAYS/PACK) ×3 IMPLANT
SPONGE GAUZE 2X2 8PLY STRL LF (GAUZE/BANDAGES/DRESSINGS) IMPLANT
SPONGE GAUZE 4X4 12PLY (GAUZE/BANDAGES/DRESSINGS) ×1 IMPLANT
SUT ETHILON 3 0 FSL (SUTURE) ×3 IMPLANT
SUT MON AB 2-0 SH 27 (SUTURE)
SUT MON AB 2-0 SH27 (SUTURE) ×2 IMPLANT
SUT PLAIN 3 0 SH 27IN (SUTURE) IMPLANT
SYR CONTROL 10ML LL (SYRINGE) ×3 IMPLANT

## 2013-09-11 NOTE — Anesthesia Preprocedure Evaluation (Addendum)
Anesthesia Evaluation  Patient identified by MRN, date of birth, ID band Patient awake    Reviewed: Allergy & Precautions, H&P , NPO status , Patient's Chart, lab work & pertinent test results, reviewed documented beta blocker date and time   Airway Mallampati: II  Neck ROM: Full    Dental  (+) Teeth Intact   Pulmonary asthma , COPD   + decreased breath sounds      Cardiovascular Exercise Tolerance: Poor hypertension, Pt. on medications Rhythm:Regular Rate:Bradycardia     Neuro/Psych    GI/Hepatic GERD-  Controlled,  Endo/Other    Renal/GU      Musculoskeletal   Abdominal (+) + obese,  Abdomen: soft. Bowel sounds: normal.  Peds  Hematology   Anesthesia Other Findings   Reproductive/Obstetrics                          Anesthesia Physical Anesthesia Plan  ASA: III  Anesthesia Plan: Bier Block   Post-op Pain Management:    Induction: Intravenous  Airway Management Planned: Mask  Additional Equipment:   Intra-op Plan:   Post-operative Plan:   Informed Consent:   Plan Discussed with: CRNA and Anesthesiologist  Anesthesia Plan Comments:         Anesthesia Quick Evaluation

## 2013-09-11 NOTE — Anesthesia Procedure Notes (Signed)
Anesthesia Regional Block:  Bier block (IV Regional)  Pre-Anesthetic Checklist: ,,, Correct Patient, Correct Site, Correct Laterality, Correct Procedure, Correct Position, site marked, risks and benefits discussed, Surgical consent, Pre-op evaluation  Laterality: Right  Prep: alcohol swabs       Needles:  Injection technique: Single-shot      Needle Gauge: 22 and 22 G    Additional Needles: Bier block (IV Regional) Narrative:   Performed by: Personally  Resident/CRNA: Patrici Ranks

## 2013-09-11 NOTE — OR Nursing (Signed)
Up to bathroom to void 

## 2013-09-11 NOTE — H&P (Signed)
Lauren [name] is an 79 y.o. female.   Chief Complaint: Pain right thumb right long finger HPI: This is a 72 rolled female who is in treatment for left wrist injury possible fracture with a long history of trigger fingers and de Quervain's syndrome. Her de Quervain's syndrome on the right side was treated with bracing and injection she had a previous de Quervain's release on the left to dwell. She now presents for painful triggering in the right long finger unresponsive to nonoperative treatment and radial sided wrist pain over the first compartment with swelling and positive Finkelstein's test.  She presents for release of the right long finger and the right first extensor compartment  Past Medical History  Diagnosis Date  . Asthma   . Osteoarthritis   . Essential hypertension, benign   . Hyperlipidemia   . Back pain   . GERD (gastroesophageal reflux disease)   . History of cardiac catheterization     Reportedly normal coronaries in 2000  . COPD (chronic obstructive pulmonary disease)     O2 at night sometimes    Past Surgical History  Procedure Laterality Date  . Shoulder surgery      Rotator Cuff  . Ankle surgery      Rod and 13 screws  . Rotator cuff repair    . Appendectomy    . Breast biopsy  1996    Benign  . Abdominal hysterectomy  1994  . Colonoscopy  01/27/2011    Normal rectum/Sigmoid diverticula/Multiple colonic polyps removed (TUBULAR ADENOMA) Next TCS 01/2016  . Esophagogastroduodenoscopy  08/21/2003       Normal tubular esophagus status post passage of Maloney dilator as  described above/  2. Multiple linear antral erosions of uncertain significance, could be NSAID  . Esophagogastroduodenoscopy  08/04/2012    Procedure: ESOPHAGOGASTRODUODENOSCOPY (EGD);  Surgeon: Daneil Dolin, MD;  Location: [facility];  Service: Endoscopy;  Laterality: N/A;  7:30  . Esophageal dilation  08/04/2012    Procedure: ESOPHAGEAL DILATION;  Surgeon: Daneil Dolin, MD;  Location: [facility];  Service: Endoscopy;;  . Dorsal compartment release Left 02/03/2013    Procedure: RELEASE DORSAL COMPARTMENT (DEQUERVAIN);  Surgeon: Carole Civil, MD;  Location: [facility];  Service: Orthopedics;  Laterality: Left;    Family History  Problem Relation Age of Onset  . Tuberculosis Mother 87    Deceased  . Kidney failure Father 57  . Colon cancer Neg Hx   . Asthma    . Arthritis     Social History:  reports that she quit smoking about 50 years ago. Her smoking use included Cigarettes. She has a 12.5 pack-year smoking history. She does not have any smokeless tobacco history on file. She reports that she does not drink alcohol or use illicit drugs.  Allergies:  Allergies  Allergen Reactions  . Penicillins Other (See Comments)    Unknown     No prescriptions prior to admission    No results found for this or any previous visit (from the past 48 hour(s)). No results found.  Review of Systems  Constitutional: Positive for malaise/fatigue.  Musculoskeletal: Positive for joint pain.  All other systems reviewed and are negative.    There were no vitals taken for this visit. Physical Exam  Constitutional: She is oriented to person, place, and time. She appears well-developed and well-nourished. No distress.  HENT:  Head: Normocephalic.  Eyes: Conjunctivae are normal.  Neck: Neck supple. No JVD present. No tracheal deviation  present.  Cardiovascular: Intact distal pulses.   Respiratory: No stridor.  Musculoskeletal:       Right wrist: She exhibits decreased range of motion, tenderness and swelling. She exhibits no bony tenderness, no effusion, no crepitus, no deformity and no laceration.       Arms:      Right hand: She exhibits decreased range of motion, tenderness and swelling. She exhibits normal two-point discrimination, normal capillary refill, no deformity and no laceration. Normal sensation noted. Normal strength noted.       Hands: Lymphadenopathy:    She  has no cervical adenopathy.  Neurological: She is alert and oriented to person, place, and time. She has normal strength and normal reflexes. She displays normal reflexes. She exhibits normal muscle tone. Gait normal.  Skin: Skin is warm. No rash noted. She is diaphoretic. No erythema. No pallor.  Psychiatric: She has a normal mood and affect. Her behavior is normal. Judgment and thought content normal.     Assessment/Plan Trigger finger right long finger de Quervain's syndrome right wrist/hand  Plan trigger finger release right long finger and de Quervain's release right is extensor compartment Arther Abbott 09/11/2013, 10:15 AM

## 2013-09-11 NOTE — Interval H&P Note (Signed)
History and Physical Interval Note:  09/11/2013 12:30 PM  Lauren [name]  has presented today for surgery, with the diagnosis of Right long trigger finger, right thumb dequervains syndrome  The various methods of treatment have been discussed with the patient and family. After consideration of risks, benefits and other options for treatment, the patient has consented to  Procedure(s): RELEASE TRIGGER FINGER/A-1 PULLEY (Right) RELEASE DORSAL COMPARTMENT (DEQUERVAIN) (Right) as a surgical intervention .  The patient's history has been reviewed, patient examined, no change in status, stable for surgery.  I have reviewed the patient's chart and labs.  Questions were answered to the patient's satisfaction.    Surgery: Right long finger A1 pulley release  Right thumb extensor compartment # 1  Arther Abbott

## 2013-09-11 NOTE — Transfer of Care (Signed)
Immediate Anesthesia Transfer of Care Note  Patient: Lauren [name]  Procedure(s) Performed: Procedure(s): RIGHT LONG FINGER RELEASE TRIGGER FINGER (Right) RELEASE DORSAL COMPARTMENT (DEQUERVAIN) (Right)  Patient Location: PACU  Anesthesia Type:Bier block  Level of Consciousness: awake and patient cooperative  Airway & Oxygen Therapy: Patient Spontanous Breathing and Patient connected to face mask oxygen  Post-op Assessment: Report given to PACU RN, Post -op Vital signs reviewed and stable and Patient moving all extremities  Post vital signs: Reviewed and stable  Complications: No apparent anesthesia complications

## 2013-09-11 NOTE — Interval H&P Note (Signed)
History and Physical Interval Note:  09/11/2013 12:31 PM  Lauren [name]  has presented today for surgery, with the diagnosis of Right long trigger finger, right thumb dequervains syndrome  The various methods of treatment have been discussed with the patient and family. After consideration of risks, benefits and other options for treatment, the patient has consented to  Procedure(s): RELEASE TRIGGER FINGER/A-1 PULLEY (Right) RELEASE DORSAL COMPARTMENT (DEQUERVAIN) (Right) as a surgical intervention .  The patient's history has been reviewed, patient examined, no change in status, stable for surgery.  I have reviewed the patient's chart and labs.  Questions were answered to the patient's satisfaction.     Arther Abbott

## 2013-09-11 NOTE — Op Note (Signed)
09/11/2013  1:35 PM  PATIENT:  Lauren [name]  79 y.o. female  PRE-OPERATIVE DIAGNOSIS:  Right long trigger finger, right thumb dequervains syndrome  POST-OPERATIVE DIAGNOSIS:  Right long trigger finger, right thumb dequervains syndrome  PROCEDURE:  Procedure(s): RIGHT LONG FINGER RELEASE TRIGGER FINGER (Right) RELEASE DORSAL COMPARTMENT (DEQUERVAIN) (Right)  Clarification we did a dorsal compartment release/first extensor compartment release for de Quervain's syndrome right wrist Followed by A1 pulley release right long finger for trigger finger  SURGEON:  Surgeon(s) and Role:    * Carole Civil, MD - Primary  PHYSICIAN ASSISTANT:   ASSISTANTS: none   ANESTHESIA:   regional  EBL:  Total I/O In: 800 [I.V.:800] Out: -   BLOOD ADMINISTERED:none  DRAINS: none   LOCAL MEDICATIONS USED:  MARCAINE    and Amount: 20  ml  SPECIMEN:  No Specimen  DISPOSITION OF SPECIMEN:  N/A  COUNTS:  YES  TOURNIQUET:   Total Tourniquet Time Documented: Upper Arm (Right) - 42 minutes Total: Upper Arm (Right) - 42 minutes   DICTATION: .Viviann Spare Dictation  PLAN OF CARE: Discharge to home after PACU  PATIENT DISPOSITION:  PACU - hemodynamically stable.   Delay start of Pharmacological VTE agent (>24hrs) due to surgical blood loss or risk of bleeding: not applicable  Operative findings stenosing tenosynovitis of the first extensor compartment of the right wrist  Stenosing tenosynovitis of the A1 pulley of the right long finger  Details of procedure after proper site marking and patient identification the chart was updated and the patient was taken to surgery she received a gram of vancomycin secondary to penicillin allergy. After establishment Bier block the arm was prepped and draped sterilely. Timeout #1 was completed.  A transverse incision was made in the skin distal to the first extensor compartment sheath. After blunt dissection to expose the sensory branch of the radial  nerve, the first extensor compartment was released on the dorsal ulnar side and a separate slip of APL was found and released as well. The EPB was released.  The wound was irrigated and closed with 3-0 nylon suture in interrupted fashion  Second procedure  A second timeout procedure was completed as well. A transverse incision was made proximal to the A1 pulley of the right long finger blunt dissection was carried out to protect the digital nerves. The proximal aspect of the A1 pulley was dissected out and a punishment was passed beneath the pulley which was then released with sharp dissection. Flexion extension of the digit confirm adequate release.  Wound was irrigated and closed with 3-0 nylon sutures  10 cc of Marcaine plain was injected in each incision  Sterile dressings were applied. Tourniquet was released. Fingers have good color and capillary refill.  Sterile dressing was applied.  Patient was taken to recovery room in stable condition

## 2013-09-11 NOTE — Brief Op Note (Signed)
09/11/2013  1:35 PM  PATIENT:  Lauren [name]  79 y.o. female  PRE-OPERATIVE DIAGNOSIS:  Right long trigger finger, right thumb dequervains syndrome  POST-OPERATIVE DIAGNOSIS:  Right long trigger finger, right thumb dequervains syndrome  PROCEDURE:  Procedure(s): RIGHT LONG FINGER RELEASE TRIGGER FINGER (Right) RELEASE DORSAL COMPARTMENT (DEQUERVAIN) (Right)  Clarification we did a dorsal compartment release/first extensor compartment release for de Quervain's syndrome right wrist Followed by A1 pulley release right long finger for trigger finger  SURGEON:  Surgeon(s) and Role:    * Carole Civil, MD - Primary  PHYSICIAN ASSISTANT:   ASSISTANTS: none   ANESTHESIA:   regional  EBL:  Total I/O In: 800 [I.V.:800] Out: -   BLOOD ADMINISTERED:none  DRAINS: none   LOCAL MEDICATIONS USED:  MARCAINE    and Amount: 20  ml  SPECIMEN:  No Specimen  DISPOSITION OF SPECIMEN:  N/A  COUNTS:  YES  TOURNIQUET:   Total Tourniquet Time Documented: Upper Arm (Right) - 42 minutes Total: Upper Arm (Right) - 42 minutes   DICTATION: .Viviann Spare Dictation  PLAN OF CARE: Discharge to home after PACU  PATIENT DISPOSITION:  PACU - hemodynamically stable.   Delay start of Pharmacological VTE agent (>24hrs) due to surgical blood loss or risk of bleeding: not applicable  Operative findings stenosing tenosynovitis of the first extensor compartment of the right wrist  Stenosing tenosynovitis of the A1 pulley of the right long finger  Details of procedure after proper site marking and patient identification the chart was updated and the patient was taken to surgery she received a gram of vancomycin secondary to penicillin allergy. After establishment Bier block the arm was prepped and draped sterilely. Timeout #1 was completed.  A transverse incision was made in the skin distal to the first extensor compartment sheath. After blunt dissection to expose the sensory branch of the radial  nerve, the first extensor compartment was released on the dorsal ulnar side and a separate slip of APL was found and released as well. The EPB was released.  The wound was irrigated and closed with 3-0 nylon suture in interrupted fashion  Second procedure  A second timeout procedure was completed as well. A transverse incision was made proximal to the A1 pulley of the right long finger blunt dissection was carried out to protect the digital nerves. The proximal aspect of the A1 pulley was dissected out and a punishment was passed beneath the pulley which was then released with sharp dissection. Flexion extension of the digit confirm adequate release.  Wound was irrigated and closed with 3-0 nylon sutures  10 cc of Marcaine plain was injected in each incision  Sterile dressings were applied. Tourniquet was released. Fingers have good color and capillary refill.  Sterile dressing was applied.  Patient was taken to recovery room in stable condition

## 2013-09-11 NOTE — Anesthesia Postprocedure Evaluation (Signed)
  Anesthesia Post-op Note  Patient: Lauren [name]  Procedure(s) Performed: Procedure(s): RIGHT LONG FINGER RELEASE TRIGGER FINGER (Right) RELEASE DORSAL COMPARTMENT (DEQUERVAIN) (Right)  Patient Location: PACU  Anesthesia Type:Bier block  Level of Consciousness: awake, alert , oriented and patient cooperative  Airway and Oxygen Therapy: Patient Spontanous Breathing  Post-op Pain: 2 /10, mild  Post-op Assessment: Post-op Vital signs reviewed, Patient's Cardiovascular Status Stable, Respiratory Function Stable, Patent Airway, No signs of Nausea or vomiting and Pain level controlled  Post-op Vital Signs: Reviewed and stable  Complications: No apparent anesthesia complications

## 2013-09-13 ENCOUNTER — Encounter (HOSPITAL_COMMUNITY): Payer: Self-pay | Admitting: Orthopedic Surgery

## 2013-09-14 ENCOUNTER — Encounter: Payer: Self-pay | Admitting: Orthopedic Surgery

## 2013-09-14 ENCOUNTER — Ambulatory Visit (INDEPENDENT_AMBULATORY_CARE_PROVIDER_SITE_OTHER): Payer: Self-pay | Admitting: Orthopedic Surgery

## 2013-09-14 VITALS — BP 124/62 | Ht 63.0 in | Wt 181.0 lb

## 2013-09-14 DIAGNOSIS — Z9889 Other specified postprocedural states: Secondary | ICD-10-CM

## 2013-09-14 DIAGNOSIS — M654 Radial styloid tenosynovitis [de Quervain]: Secondary | ICD-10-CM

## 2013-09-14 DIAGNOSIS — M6530 Trigger finger, unspecified finger: Secondary | ICD-10-CM

## 2013-09-14 NOTE — Progress Notes (Signed)
Patient ID: Lauren [name], female   DOB: [birth year], 79 y.o.   MRN: [id number]  Chief Complaint  Patient presents with  . Follow-up    Post op 1 Right long  trigger finger and right dequervains thumb   Date of surgery October 20 Postop visit #1 status post right long finger trigger release and right extensor carpi may release for de Quervain's syndrome  Other than a little swelling the incisions look good the patient reports improvement in the pain in her thumb she is very happy  Recommend suture removal at postop day 14

## 2013-09-14 NOTE — Patient Instructions (Signed)
Change band aids as needed   Move fingers as tolerated

## 2013-09-18 ENCOUNTER — Ambulatory Visit (HOSPITAL_COMMUNITY)
Admission: RE | Admit: 2013-09-18 | Discharge: 2013-09-18 | Disposition: A | Discharge status: Home / Self Care | Payer: Medicare Other | Source: Ambulatory Visit | Attending: General Surgery | Admitting: General Surgery

## 2013-09-18 DIAGNOSIS — Z1231 Encounter for screening mammogram for malignant neoplasm of breast: Secondary | ICD-10-CM | POA: Insufficient documentation

## 2013-09-18 DIAGNOSIS — Z09 Encounter for follow-up examination after completed treatment for conditions other than malignant neoplasm: Secondary | ICD-10-CM

## 2013-09-25 ENCOUNTER — Ambulatory Visit (INDEPENDENT_AMBULATORY_CARE_PROVIDER_SITE_OTHER): Payer: Self-pay | Admitting: Orthopedic Surgery

## 2013-09-25 VITALS — BP 178/86 | Ht 63.0 in | Wt 181.0 lb

## 2013-09-25 DIAGNOSIS — M6530 Trigger finger, unspecified finger: Secondary | ICD-10-CM

## 2013-09-25 DIAGNOSIS — M654 Radial styloid tenosynovitis [de Quervain]: Secondary | ICD-10-CM

## 2013-09-25 NOTE — Progress Notes (Signed)
Patient ID: Lauren [name], female   DOB: 09/06/1934, 79 y.o.   MRN: [id number]  Chief Complaint  Patient presents with  . Follow-up    Post op 2 right long  finger and right Dequervains, remove sutures DOS 09/11/13    BP 178/86  Ht 5\' 3"  (1.6 m)  Wt 181 lb (82.101 kg)  BMI 32.07 kg/m2  The patient's sutures are removed the wounds look clean and dry and intact as far as the wrist. There is some separation of the trigger finger release which is treated with skin seal and Steri-Strips  She will start occupational therapy to address the sprain of the PIP joint of the right long finger and increased range of motion at the metacarpophalangeal joint of the trigger finger release.  Return in 4 weeks

## 2013-09-25 NOTE — Patient Instructions (Signed)
Start OT    

## 2013-10-04 ENCOUNTER — Ambulatory Visit (HOSPITAL_COMMUNITY)
Admission: RE | Admit: 2013-10-04 | Discharge: 2013-10-04 | Disposition: A | Discharge status: Home / Self Care | Payer: Medicare Other | Source: Ambulatory Visit | Attending: Orthopedic Surgery | Admitting: Orthopedic Surgery

## 2013-10-04 DIAGNOSIS — M79609 Pain in unspecified limb: Secondary | ICD-10-CM | POA: Insufficient documentation

## 2013-10-04 DIAGNOSIS — M25539 Pain in unspecified wrist: Secondary | ICD-10-CM | POA: Insufficient documentation

## 2013-10-04 DIAGNOSIS — J449 Chronic obstructive pulmonary disease, unspecified: Secondary | ICD-10-CM | POA: Insufficient documentation

## 2013-10-04 DIAGNOSIS — IMO0001 Reserved for inherently not codable concepts without codable children: Secondary | ICD-10-CM | POA: Insufficient documentation

## 2013-10-04 DIAGNOSIS — J4489 Other specified chronic obstructive pulmonary disease: Secondary | ICD-10-CM | POA: Insufficient documentation

## 2013-10-04 DIAGNOSIS — R279 Unspecified lack of coordination: Secondary | ICD-10-CM | POA: Insufficient documentation

## 2013-10-04 DIAGNOSIS — I10 Essential (primary) hypertension: Secondary | ICD-10-CM | POA: Insufficient documentation

## 2013-10-04 DIAGNOSIS — M6281 Muscle weakness (generalized): Secondary | ICD-10-CM | POA: Insufficient documentation

## 2013-10-04 DIAGNOSIS — M25649 Stiffness of unspecified hand, not elsewhere classified: Secondary | ICD-10-CM | POA: Insufficient documentation

## 2013-10-05 ENCOUNTER — Ambulatory Visit (HOSPITAL_COMMUNITY)
Admission: RE | Admit: 2013-10-05 | Discharge: 2013-10-05 | Disposition: A | Discharge status: Home / Self Care | Payer: Medicare Other | Source: Ambulatory Visit | Attending: Pulmonary Disease | Admitting: Pulmonary Disease

## 2013-10-05 NOTE — Evaluation (Addendum)
Occupational Therapy Evaluation  Patient Details  Name: Lauren [name] MRN: [id number] Date of Birth: [birth year]  Today's Date: 10/04/2013 Time: [id number] OT Time Calculation (min): 48 min Evaluation 1435-1523 (26')  Visit#: 1 of 20  Re-eval: 11/01/13  Assessment Diagnosis: Right 3rd digit trigger finger release and de Quervain's release Next MD Visit: 10/24/2013  Authorization: [facility] Medicare   Authorization Time Period: Before 10th visit  Authorization Visit#: 1 of 20   Past Medical History:  Past Medical History  Diagnosis Date  . Asthma   . Osteoarthritis   . Essential hypertension, benign   . Hyperlipidemia   . Back pain   . GERD (gastroesophageal reflux disease)   . History of cardiac catheterization     Reportedly normal coronaries in 2000  . COPD (chronic obstructive pulmonary disease)     O2 at night sometimes   Past Surgical History:  Past Surgical History  Procedure Laterality Date  . Shoulder surgery      Rotator Cuff  . Ankle surgery      Rod and 13 screws  . Rotator cuff repair    . Appendectomy    . Breast biopsy  1996    Benign  . Abdominal hysterectomy  1994  . Colonoscopy  01/27/2011    Normal rectum/Sigmoid diverticula/Multiple colonic polyps removed (TUBULAR ADENOMA) Next TCS 01/2016  . Esophagogastroduodenoscopy  08/21/2003       Normal tubular esophagus status post passage of Maloney dilator as  described above/  2. Multiple linear antral erosions of uncertain significance, could be NSAID  . Esophagogastroduodenoscopy  08/04/2012    Procedure: ESOPHAGOGASTRODUODENOSCOPY (EGD);  Surgeon: Daneil Dolin, MD;  Location: [facility];  Service: Endoscopy;  Laterality: N/A;  7:30  . Esophageal dilation  08/04/2012    Procedure: ESOPHAGEAL DILATION;  Surgeon: Daneil Dolin, MD;  Location: [facility];  Service: Endoscopy;;  . Dorsal compartment release Left 02/03/2013    Procedure: RELEASE DORSAL COMPARTMENT (DEQUERVAIN);  Surgeon: Carole Civil, MD;  Location: [facility];  Service: Orthopedics;  Laterality: Left;  . Trigger finger release Right 09/11/2013    Procedure: RIGHT LONG FINGER RELEASE TRIGGER FINGER;  Surgeon: Carole Civil, MD;  Location: [facility];  Service: Orthopedics;  Laterality: Right;  . Dorsal compartment release Right 09/11/2013    Procedure: RELEASE DORSAL COMPARTMENT (DEQUERVAIN);  Surgeon: Carole Civil, MD;  Location: [facility];  Service: Orthopedics;  Laterality: Right;    Subjective Symptoms/Limitations Symptoms: S:  I have these scabs and he put bandages on them  Patient Stated Goals: to make the pain better and use my hand more  Pain Assessment Currently in Pain?: Yes Pain Score: 3  Pain Location: Hand Pain Orientation: Right Pain Type: Acute pain;Surgical pain   Balance Screening Balance Screen Has the patient fallen in the past 6 months: Yes How many times?: 3 Has the patient had a decrease in activity level because of a fear of falling? : No Is the patient reluctant to leave their home because of a fear of falling? : No  Prior Functioning  Home Living Family/patient expects to be discharged to:: Private residence Prior Function Level of Independence: Independent with basic ADLs Driving: Yes Vocation: Retired Leisure: Hobbies-yes (Comment) Comments: puzzle book  Assessment ADL/Vision/Perception ADL ADL Comments: increased difficulty self feeding, brushing teeth, combing hair, pulling pants over hips, toileting hygiene,  donning socks, difficulty tying shoes Dominant Hand: Right (Patient unable to manipulate pen/utensil for signing name) Vision - History Baseline  Vision: Wears glasses all the time  Cognition/Observation Cognition Overall Cognitive Status: Within Functional Limits for tasks assessed Arousal/Alertness: Awake/alert Orientation Level: Oriented X4 Observation/Other Assessments Observations: surgical incisions at base of R CMC 3cm in length, trace width;   palmar crease of right hand at 3rd phalange 1.4 cm length, 0.7 cm width with induration  Sensation/Coordination/Edema Sensation Light Touch: Appears Intact Coordination Gross Motor Movements are Fluid and Coordinated: Yes Fine Motor Movements are Fluid and Coordinated: No 9 Hole Peg Test: R: 26.72 seconds; L: 24.50 seconds Edema Edema: (in cm) R: wrist 18.2; MP 21.3; T MP8.8; T IP 7.0; I PIP 7.0; I DIP 5.6; L PIP 7.9; L DIP 5.8; R PIP 6.6; R DIP 5.0; S PIP 5.6; S DIP 4.6.    L: wrist 17.2; MP 20.4; T MP 6.9; T IP 6.7; I PIP 6.4; I DIP 5.2; L PIP 6.4; L DIP 5.4; R PIP 5.9; R DIP 4.8; S PIP 5.2; S DIP 4.4  Additional Assessments RUE Assessment RUE Assessment: Exceptions to [facility] RUE AROM (degrees) Overall AROM Right Upper Extremity: Deficits RUE Overall AROM Comments: flexion - 1st 32/50; 2nd 50/82/28; 3rd 52/76/26; 4th 48/78/22; 5th 62/60/34  (MP/PIP/DIP) Right Wrist Extension: 48 Degrees Right Wrist Flexion: 26 Degrees Right Composite Finger Flexion: 50% Right Thumb Opposition: Digit 2;Digit 3 (unable to oppose 4th and 5th ) RUE Strength RUE Overall Strength: Deficits Right Wrist Flexion: 2+/5 Right Wrist Extension: 2+/5 Grip (lbs): 6 Lateral Pinch: 4 lbs 3 Point Pinch: 0 lbs LUE Assessment LUE Assessment: Within Functional Limits LUE AROM (degrees) Left Wrist Extension: 64 Degrees Left Wrist Flexion: 54 Degrees Left Thumb Opposition: Digit 2;Digit 3;Digit 4;Digit 5 LUE Strength Grip (lbs): 43 Lateral Pinch: 14 lbs 3 Point Pinch: 6 lbs Palpation Palpation: max fascial restrictions palmar surface of hand; min-mod base of CMC  Right Hand Strength - Pinch (lbs) Lateral Pinch: 4 lbs 3 Point Pinch: 0 lbs Left Hand Strength - Pinch (lbs) Lateral Pinch: 14 lbs 3 Point Pinch: 6 lbs    Occupational Therapy Assessment and Plan OT Assessment and Plan Clinical Impression Statement: A:  Patient presents with decreased A/PROM, strength and coordination due to pain, edema and  fascial restrictions of her dominant right hand s/p Trigger Finger Release .  Deficits causing decreased independence with B/IADL activities and leisure activites.   Pt will benefit from skilled therapeutic intervention in order to improve on the following deficits: Impaired UE functional use;Increased fascial restricitons;Decreased activity tolerance;Decreased range of motion;Decreased coordination;Decreased strength;Pain Rehab Potential: Good OT Frequency: Min 3X/week OT Duration: 8 weeks OT Treatment/Interventions: Self-care/ADL training;Therapeutic activities;Therapeutic exercise;Neuromuscular education;Patient/family education;Manual therapy;Modalities;Splinting OT Plan: Skilled OT intervention to decrease pain, fascial restrictions in forearm and hand, improve wrist and hand AROM in pain free zone and strength,  FMC needed to use RUE as dominant with all B/IADL/s. Treatment Plan: MFR and manual stretching to forearm/hand, P/AAROM, right hand strengthening, coordination training.    Goals Home Exercise Program Pt/caregiver will Perform Home Exercise Program: For increased ROM;For increased strengthening PT Goal: Perform Home Exercise Program - Progress: Goal set today Short Term Goals Time to Complete Short Term Goals: 4 weeks Short Term Goal 1: Patietn will be deucated on HEP  Short Term Goal 2: Patietn will increase right grip strength by 5 pounds and pinch by 1 pound to increase ability to complete ADL dressing tasks.  Short Term Goal 3: Patient will improve wrist AROM by 5 for increased ability to complete toileting hygiene Short Term Goal 4: Patient will  improve composite finger flexion with right hand to >/= 75%to improve ability to grasp drink Short Term Goal 5: Patient will decrease 9 hole peg time by 2 seconds to increase ability to tie shoes.  Long Term Goals Time to Complete Long Term Goals: 8 weeks Long Term Goal 1: Patient will return to prior level of independence with all  B/IADLs and leisure activities utilizing her dominant right hand  Long Term Goal 2: Patient will increase right grip strength to >/= 25 pounds and pinch by 5 pounds for increased ability to open containers Long Term Goal 3: Patient will increase wrist AROM by 10 degrees for increased ability to comb hair and brush teeth with her right dominant hand. Long Term Goal 4: Patient will complete Nine Hole Peg score with R hand in under 23 seconds for increased ability to manipulate coins, buttons/zippers and tie shoes  Long Term Goal 5: Patient will demo ability to sign name legibly 3/3 trials   Problem List Patient Active Problem List   Diagnosis Date Noted  . History of hand surgery 09/14/2013  . De Quervain's syndrome (tenosynovitis) 09/02/2013  . Trigger finger, acquired 09/02/2013  . Distal radius fracture 08/03/2013  . Finger pain 08/03/2013  . Left wrist pain 08/03/2013  . Sprain of right middle finger 08/03/2013  . De Quervain's disease (radial styloid tenosynovitis) 11/01/2012  . Precordial pain 09/30/2012  . Mixed hyperlipidemia 09/30/2012  . Lower abdominal pain 07/06/2012  . Umbilical hernia [id number]  . Esophageal dysphagia 07/06/2012  . Essential hypertension, benign 01/01/2010  . COPD 01/01/2010  . GERD 01/01/2010    End of Session Activity Tolerance: Patient tolerated treatment well General Behavior During Therapy: WFL for tasks assessed/performed OT Plan of Care OT Home Exercise Plan: PROM digit flexion/extension, edema management  Consulted and Agree with Plan of Care: Patient  GO Functional Assessment Tool Used: FOTO 30/100.  9 hole peg test R: 26.72 seconds; L: 24.50 seconds;   Grip strength R 6#; L 43# Functional Limitation: Carrying, moving and handling objects Carrying, Moving and Handling Objects Current Status [id number]): At least 60 percent but less than 80 percent impaired, limited or restricted Carrying, Moving and Handling Objects Goal Status [phone]): At  least 1 percent but less than 20 percent impaired, limited or restricted  Donney Rankins, OTR/L  10/04/2013, 4:03 PM  Physician Documentation Your signature is required to indicate approval of the treatment plan as stated above.  Please sign and either send electronically or make a copy of this report for your files and return this physician signed original.  Please mark one 1.__approve of plan  2. ___approve of plan with the following conditions.   ______________________________                                                          _____________________ Physician Signature  Date  

## 2013-10-05 NOTE — Progress Notes (Signed)
Occupational Therapy Treatment Patient Details  Name: Lauren [name] MRN: [id number] Date of Birth: 19-Dec-1933  Today's Date: 10/05/2013 Time: [id number] OT Time Calculation (min): 45 min Self Care 1303-1315 (12') Manual 1315-1333 (18') TherExercises 1333-1348 (15')  Visit#: 2 of 20  Re-eval: 11/01/13    Authorization: [employer] Medicare   Authorization Time Period: Before 10th visit  Authorization Visit#: 2 of 20  Subjective Symptoms/Limitations Symptoms: S:  I did good last night... it felt better  Pain Assessment Currently in Pain?: Yes Pain Score: 3  Pain Location: Hand Pain Orientation: Right Pain Type: Acute pain;Surgical pain   Exercise/Treatments Wrist Exercises Forearm Supination: AAROM;10 reps Forearm Pronation: AAROM;10 reps Wrist Flexion: PROM;AAROM;10 reps Wrist Extension: PROM;AAROM;10 reps  Hand Exercises MCPJ Flexion: PROM;10 reps (place and hold for prolonged stretch x 3) MCPJ Extension: PROM;10 reps PIPJ Flexion: PROM;10 reps (place and hold for prolonged stretch x 3) PIPJ Extension: PROM;10 reps (place and hold for prolonged stretch x 3) DIPJ Flexion: PROM;10 reps (place and hold for prolonged stretch x 3) DIPJ Extension: PROM;10 reps Other Hand Exercises: composite finger flexion x 10 reps each (grasp and release with elbow at 90 degrees on tabletop      Manual Therapy Manual Therapy: Myofascial release Myofascial Release: MFR and manual stretching to flexor and extensor forearm,  wrist and hand to decrease pain and fascial restrictions for greated wrist and digit mobility Activities of Daily Living Activities of Daily Living: worked on cleaning dead, dried skin from palmar surface this date to improve comfort, decrease restrictions and promote healing.  exposed well healed incision area in palmar crease   Occupational Therapy Assessment and Plan OT Assessment and Plan Clinical Impression Statement: A:  Patient tolerated removal of dead skin from  palmar surface this date with no difficulties.  Educated on with return demo wrist exercises this date, handout given and verbalized good understanding.  Patient with much improved finger flexion following treatment.   OT Plan: P:  MFR and manual stretching to forearm/wrist/hand, P/AAROM, right hand coordination training.    Goals Short Term Goals Short Term Goal 1: Patient will be educated on HEP  Short Term Goal 1 Progress: Progressing toward goal Short Term Goal 2: Patietn will increase right grip strength by 5 pounds and pinch by 1 pound to increase ability to complete ADL dressing tasks.  Short Term Goal 2 Progress: Progressing toward goal Short Term Goal 3: Patient will improve wrist AROM by 5 for increased ability to complete toileting hygiene Short Term Goal 3 Progress: Progressing toward goal Short Term Goal 4: Patient will improve composite finger flexion with right hand to >/= 75%to improve ability to grasp drink Short Term Goal 4 Progress: Progressing toward goal Short Term Goal 5: Patient will decrease 9 hole peg time by 2 seconds to increase ability to tie shoes.  Short Term Goal 5 Progress: Progressing toward goal Long Term Goals Long Term Goal 1: Patient will return to prior level of independence with all B/IADLs and leisure activities utilizing her dominant right hand  Long Term Goal 1 Progress: Progressing toward goal Long Term Goal 2: Patient will increase right grip strength to >/= 25 pounds and pinch by 5 pounds for increased ability to open containers Long Term Goal 2 Progress: Progressing toward goal Long Term Goal 3: Patient will increase wrist AROM by 10 degrees for increased ability to comb hair and brush teeth with her right dominant hand. Long Term Goal 3 Progress: Progressing toward goal Long Term Goal 4:  Patient will complete Nine Hole Peg score with R hand in under 23 seconds for increased ability to manipulate coins, buttons/zippers and tie shoes  Long Term Goal  4 Progress: Progressing toward goal Long Term Goal 5: Patient will demo ability to sign name legibly 3/3 trials  Long Term Goal 5 Progress: Progressing toward goal  Problem List Patient Active Problem List   Diagnosis Date Noted  . History of hand surgery 09/14/2013  . De Quervain's syndrome (tenosynovitis) 09/02/2013  . Trigger finger, acquired 09/02/2013  . Distal radius fracture 08/03/2013  . Finger pain 08/03/2013  . Left wrist pain 08/03/2013  . Sprain of right middle finger 08/03/2013  . De Quervain's disease (radial styloid tenosynovitis) 11/01/2012  . Precordial pain 09/30/2012  . Mixed hyperlipidemia 09/30/2012  . Lower abdominal pain 07/06/2012  . Umbilical hernia [id number]  . Esophageal dysphagia 07/06/2012  . Essential hypertension, benign 01/01/2010  . COPD 01/01/2010  . GERD 01/01/2010    End of Session Activity Tolerance: Patient tolerated treatment well General Behavior During Therapy: [facility] for tasks assessed/performed OT Plan of Care OT Home Exercise Plan: wrist exercises OT Patient Instructions: handout given  Consulted and Agree with Plan of Care: Patient  GO    Donney Rankins, OTR/L  10/05/2013, 2:25 PM

## 2013-10-10 ENCOUNTER — Ambulatory Visit (HOSPITAL_COMMUNITY)
Admission: RE | Admit: 2013-10-10 | Discharge: 2013-10-10 | Disposition: A | Discharge status: Home / Self Care | Payer: Medicare Other | Source: Ambulatory Visit | Attending: Pulmonary Disease | Admitting: Pulmonary Disease

## 2013-10-10 DIAGNOSIS — M79644 Pain in right finger(s): Secondary | ICD-10-CM

## 2013-10-10 DIAGNOSIS — M25532 Pain in left wrist: Secondary | ICD-10-CM

## 2013-10-10 DIAGNOSIS — S63612A Unspecified sprain of right middle finger, initial encounter: Secondary | ICD-10-CM

## 2013-10-10 NOTE — Progress Notes (Signed)
Occupational Therapy Treatment Patient Details  Name: Lauren MCCOUN MRN: [id number] Date of Birth: 16-Sep-1934  Today's Date: 10/10/2013 Time: [id number] OT Time Calculation (min): 35 min Manual therapy [id number] 10' Therapeutic exercises [id number] 25' Visit#: 3 of 24  Re-eval: 11/01/13    Authorization: [facility] Medicare   Authorization Time Period: Before 10th visit  Authorization Visit#: 3 of 10  Subjective S:  It gets a little sore when I exercise. Limitations: progress as tolerated Pain Assessment Currently in Pain?: Yes Pain Score: 3  Pain Location: Wrist Pain Orientation: Right Pain Type: Acute pain  Precautions/Restrictions     Exercise/Treatments Wrist Exercises Forearm Supination: PROM;AROM;10 reps Forearm Pronation: PROM;AROM;10 reps Wrist Flexion: PROM;AROM;10 reps Wrist Extension: PROM;AROM;10 reps Wrist Radial Deviation: PROM;AROM;10 reps Wrist Ulnar Deviation: PROM;AROM;10 reps   Sponges: 14, 15, 16, 16  Hand Exercises MCPJ Flexion: PROM;10 reps MCPJ Extension: PROM;10 reps PIPJ Flexion: PROM;10 reps PIPJ Extension: PROM;10 reps DIPJ Flexion: PROM;10 reps DIPJ Extension: PROM;10 reps Joint Blocking Exercises: each joint and each digit 5 times PROM Sponges: 14, 15, 16, 16 Tendon Glides: 5 times  Other Hand Exercises: grooved peg board      Manual Therapy Manual Therapy: Myofascial release Myofascial Release: MFR and manual stretching to flexor and extensor forearm, wrist and hand to decrease pain and fascial restrictions for greated wrist and digit mobility  Occupational Therapy Assessment and Plan OT Assessment and Plan Clinical Impression Statement: A:  Significant improvement in AROM in digits and wrist this date.  OT Plan: P:  Increase ability to maintain grasp on pegs when manipulating pegs from grooved pegboard.   Goals Short Term Goals Short Term Goal 1: Patient will be educated on HEP  Short Term Goal 1 Progress: Progressing toward  goal Short Term Goal 2: Patietn will increase right grip strength by 5 pounds and pinch by 1 pound to increase ability to complete ADL dressing tasks.  Short Term Goal 2 Progress: Progressing toward goal Short Term Goal 3: Patient will improve wrist AROM by 5 for increased ability to complete toileting hygiene Short Term Goal 3 Progress: Progressing toward goal Short Term Goal 4: Patient will improve composite finger flexion with right hand to >/= 75%to improve ability to grasp drink Short Term Goal 4 Progress: Progressing toward goal Short Term Goal 5: Patient will decrease 9 hole peg time by 2 seconds to increase ability to tie shoes.  Short Term Goal 5 Progress: Progressing toward goal Long Term Goals Long Term Goal 1: Patient will return to prior level of independence with all B/IADLs and leisure activities utilizing her dominant right hand  Long Term Goal 1 Progress: Progressing toward goal Long Term Goal 2: Patient will increase right grip strength to >/= 25 pounds and pinch by 5 pounds for increased ability to open containers Long Term Goal 2 Progress: Progressing toward goal Long Term Goal 3: Patient will increase wrist AROM by 10 degrees for increased ability to comb hair and brush teeth with her right dominant hand. Long Term Goal 3 Progress: Progressing toward goal Long Term Goal 4: Patient will complete Nine Hole Peg score with R hand in under 23 seconds for increased ability to manipulate coins, buttons/zippers and tie shoes  Long Term Goal 4 Progress: Progressing toward goal Long Term Goal 5: Patient will demo ability to sign name legibly 3/3 trials  Long Term Goal 5 Progress: Progressing toward goal  Problem List Patient Active Problem List   Diagnosis Date Noted  . History of hand surgery  09/14/2013  . De Quervain's syndrome (tenosynovitis) 09/02/2013  . Trigger finger, acquired 09/02/2013  . Distal radius fracture 08/03/2013  . Finger pain 08/03/2013  . Left wrist pain  08/03/2013  . Sprain of right middle finger 08/03/2013  . De Quervain's disease (radial styloid tenosynovitis) 11/01/2012  . Precordial pain 09/30/2012  . Mixed hyperlipidemia 09/30/2012  . Lower abdominal pain 07/06/2012  . Umbilical hernia [id number]  . Esophageal dysphagia 07/06/2012  . Essential hypertension, benign 01/01/2010  . COPD 01/01/2010  . GERD 01/01/2010    End of Session Activity Tolerance: Patient tolerated treatment well General Behavior During Therapy: [facility] for tasks assessed/performed  GO    Vangie Bicker, OTR/L  10/10/2013, 11:53 AM

## 2013-10-12 ENCOUNTER — Ambulatory Visit (HOSPITAL_COMMUNITY)
Admission: RE | Admit: 2013-10-12 | Discharge: 2013-10-12 | Disposition: A | Discharge status: Home / Self Care | Payer: Medicare Other | Source: Ambulatory Visit | Attending: Orthopedic Surgery | Admitting: Orthopedic Surgery

## 2013-10-12 NOTE — Progress Notes (Signed)
Occupational Therapy Treatment Patient Details  Name: Lauren [name] MRN: [id number] Date of Birth: 1934/10/29  Today's Date: 10/12/2013 Time: [id number] OT Time Calculation (min): 45 min Manual therapy [id number] 17' Therapeutic exercises [id number] 28' Visit#: 4 of 24  Re-eval: 11/01/13    Authorization: [facility] Medicare   Authorization Time Period: Before 10th visit  Authorization Visit#: 4 of 10  Subjective S:  I got some sponges and have been doing the exercises at home.  Limitations: progress as tolerated Pain Assessment Currently in Pain?: Yes Pain Score: 3  Pain Location: Wrist Pain Orientation: Right Pain Type: Acute pain  Precautions/Restrictions   progress as tolerated  Exercise/Treatments Hand Exercises MCPJ Flexion: PROM;10 reps MCPJ Extension: PROM;10 reps PIPJ Flexion: PROM;10 reps PIPJ Extension: PROM;10 reps DIPJ Flexion: PROM;10 reps DIPJ Extension: PROM;10 reps Joint Blocking Exercises: each joint and each digit 5 times PROM Sponges: 16, 18 Other Hand Exercises: grooved pegboard placed with no difficulty, and removed unable to maintain grasp on pegs when removing Other Hand Exercises: pinch tree used 3 point pinch on red, yellow, and green clothespins without difficulty.  Able to complete yellow and red clothespins with thumb and long finger without difficulty     Manual Therapy Manual Therapy: Myofascial release Myofascial Release: MFR and manual stretching to flexor and extensor forearm, wrist and hand to decrease pain and fascial restrictions for greated wrist and digit mobility Splinting Splinting: educated patient on use of PIPJ extension spring splint for use with right long finger to improve PIPJ extension.  Patient demonstrated donning and doffing of splint and could say back wearing instructionsl .  Occupational Therapy Assessment and Plan OT Assessment and Plan Clinical Impression Statement: A:  Educated patient on use of PIPJ extension  splint for right long finger to improve active extension. OT Plan: P:  Follow up on use of splint, begin wrist AROM and theraputty exercises.   Goals Short Term Goals Short Term Goal 1: Patient will be educated on HEP  Short Term Goal 1 Progress: Progressing toward goal Short Term Goal 2: Patietn will increase right grip strength by 5 pounds and pinch by 1 pound to increase ability to complete ADL dressing tasks.  Short Term Goal 2 Progress: Progressing toward goal Short Term Goal 3: Patient will improve wrist AROM by 5 for increased ability to complete toileting hygiene Short Term Goal 3 Progress: Progressing toward goal Short Term Goal 4: Patient will improve composite finger flexion with right hand to >/= 75%to improve ability to grasp drink Short Term Goal 4 Progress: Progressing toward goal Short Term Goal 5: Patient will decrease 9 hole peg time by 2 seconds to increase ability to tie shoes.  Short Term Goal 5 Progress: Progressing toward goal Long Term Goals Long Term Goal 1: Patient will return to prior level of independence with all B/IADLs and leisure activities utilizing her dominant right hand  Long Term Goal 1 Progress: Progressing toward goal Long Term Goal 2: Patient will increase right grip strength to >/= 25 pounds and pinch by 5 pounds for increased ability to open containers Long Term Goal 2 Progress: Progressing toward goal Long Term Goal 3: Patient will increase wrist AROM by 10 degrees for increased ability to comb hair and brush teeth with her right dominant hand. Long Term Goal 3 Progress: Progressing toward goal Long Term Goal 4: Patient will complete Nine Hole Peg score with R hand in under 23 seconds for increased ability to manipulate coins, buttons/zippers and tie shoes  Long  Term Goal 4 Progress: Progressing toward goal Long Term Goal 5: Patient will demo ability to sign name legibly 3/3 trials  Long Term Goal 5 Progress: Progressing toward goal  Problem  List Patient Active Problem List   Diagnosis Date Noted  . History of hand surgery 09/14/2013  . De Quervain's syndrome (tenosynovitis) 09/02/2013  . Trigger finger, acquired 09/02/2013  . Distal radius fracture 08/03/2013  . Finger pain 08/03/2013  . Left wrist pain 08/03/2013  . Sprain of right middle finger 08/03/2013  . De Quervain's disease (radial styloid tenosynovitis) 11/01/2012  . Precordial pain 09/30/2012  . Mixed hyperlipidemia 09/30/2012  . Lower abdominal pain 07/06/2012  . Umbilical hernia [id number]  . Esophageal dysphagia 07/06/2012  . Essential hypertension, benign 01/01/2010  . COPD 01/01/2010  . GERD 01/01/2010    End of Session Activity Tolerance: Patient tolerated treatment well General Behavior During Therapy: [facility] for tasks assessed/performed  GO    Vangie Bicker, OTR/L  10/12/2013, 4:27 PM

## 2013-10-16 ENCOUNTER — Ambulatory Visit (HOSPITAL_COMMUNITY)
Admission: RE | Admit: 2013-10-16 | Discharge: 2013-10-16 | Disposition: A | Discharge status: Home / Self Care | Payer: Medicare Other | Source: Ambulatory Visit | Attending: Pulmonary Disease | Admitting: Pulmonary Disease

## 2013-10-16 ENCOUNTER — Other Ambulatory Visit (HOSPITAL_COMMUNITY): Payer: Self-pay | Admitting: Pulmonary Disease

## 2013-10-16 DIAGNOSIS — M25552 Pain in left hip: Secondary | ICD-10-CM

## 2013-10-16 DIAGNOSIS — M25559 Pain in unspecified hip: Secondary | ICD-10-CM | POA: Insufficient documentation

## 2013-10-16 DIAGNOSIS — M542 Cervicalgia: Secondary | ICD-10-CM

## 2013-10-16 DIAGNOSIS — M47812 Spondylosis without myelopathy or radiculopathy, cervical region: Secondary | ICD-10-CM | POA: Insufficient documentation

## 2013-10-16 DIAGNOSIS — G8929 Other chronic pain: Secondary | ICD-10-CM | POA: Insufficient documentation

## 2013-10-16 NOTE — Progress Notes (Signed)
Occupational Therapy Treatment Patient Details  Name: Lauren [name] MRN: [id number] Date of Birth: [birth year]  Today's Date: 10/16/2013 Time: [id number] OT Time Calculation (min): 46 min  Manual 1522-1542 (20') Ultrasound [id number] (12') TherExercises [id number] (14')   Visit#: 5 of 24  Re-eval: 11/01/13    Authorization: [employer] Medicare   Authorization Time Period: Before 10th visit  Authorization Visit#: 5 of 10  Subjective Symptoms/Limitations Symptoms: S: I am using it alot more that I have been doing... It doesnt hurt me as bad.  Pain Assessment Currently in Pain?: Yes Pain Score: 3  Pain Location: Wrist Pain Orientation: Right Pain Type: Acute pain Multiple Pain Sites: No   Exercise/Treatments Wrist Exercises Wrist Flexion: PROM;AAROM;10 reps Wrist Extension: PROM;AAROM;10 reps Wrist Radial Deviation: PROM;AAROM;10 reps Wrist Ulnar Deviation: PROM;AAROM;10 reps  Hand Exercises MCPJ Flexion: PROM;10 reps MCPJ Extension: PROM;10 reps PIPJ Flexion: PROM;10 reps PIPJ Extension: PROM;10 reps (place and hold x 10 seconds each x 5 reps 3rd PIP) DIPJ Flexion: PROM;10 reps DIPJ Extension: PROM;10 reps     Modalities Modalities: Ultrasound Manual Therapy Manual Therapy: Myofascial release Myofascial Release: MFR and manual stretching to flexor and extensor forearm, wrist and hand to decrease pain and fascial restrictions for greated wrist and digit mobility Ultrasound Ultrasound Location: palmar surface, lateral radial head/wrist  Ultrasound Parameters: 69mins each site; 0.6 w/cm2 pulsed  Ultrasound Goals: Edema;Pain;Other (Comment) (scar management )  Occupational Therapy Assessment and Plan OT Assessment and Plan Clinical Impression Statement: A:  Initiated use of ultrasound on incision sites this date with no signs or symptoms of difficulty, tolerated well.   Patient demos improving digit extension of right 3rd PIP and reports good follow through with splint wear  at home.   OT Plan: P: Begin wrist AROM and theraputty exercises.    Goals Short Term Goals Short Term Goal 1: Patient will be educated on HEP  Short Term Goal 1 Progress: Progressing toward goal Short Term Goal 2: Patietn will increase right grip strength by 5 pounds and pinch by 1 pound to increase ability to complete ADL dressing tasks.  Short Term Goal 2 Progress: Progressing toward goal Short Term Goal 3: Patient will improve wrist AROM by 5 for increased ability to complete toileting hygiene Short Term Goal 3 Progress: Progressing toward goal Short Term Goal 4: Patient will improve composite finger flexion with right hand to >/= 75%to improve ability to grasp drink Short Term Goal 4 Progress: Progressing toward goal Short Term Goal 5: Patient will decrease 9 hole peg time by 2 seconds to increase ability to tie shoes.  Short Term Goal 5 Progress: Progressing toward goal Long Term Goals Long Term Goal 1: Patient will return to prior level of independence with all B/IADLs and leisure activities utilizing her dominant right hand  Long Term Goal 1 Progress: Progressing toward goal Long Term Goal 2: Patient will increase right grip strength to >/= 25 pounds and pinch by 5 pounds for increased ability to open containers Long Term Goal 2 Progress: Progressing toward goal Long Term Goal 3: Patient will increase wrist AROM by 10 degrees for increased ability to comb hair and brush teeth with her right dominant hand. Long Term Goal 3 Progress: Progressing toward goal Long Term Goal 4: Patient will complete Nine Hole Peg score with R hand in under 23 seconds for increased ability to manipulate coins, buttons/zippers and tie shoes  Long Term Goal 4 Progress: Progressing toward goal Long Term Goal 5: Patient will demo ability to sign  name legibly 3/3 trials  Long Term Goal 5 Progress: Progressing toward goal  Problem List Patient Active Problem List   Diagnosis Date Noted  . History of hand  surgery 09/14/2013  . De Quervain's syndrome (tenosynovitis) 09/02/2013  . Trigger finger, acquired 09/02/2013  . Distal radius fracture 08/03/2013  . Finger pain 08/03/2013  . Left wrist pain 08/03/2013  . Sprain of right middle finger 08/03/2013  . De Quervain's disease (radial styloid tenosynovitis) 11/01/2012  . Precordial pain 09/30/2012  . Mixed hyperlipidemia 09/30/2012  . Lower abdominal pain 07/06/2012  . Umbilical hernia [id number]  . Esophageal dysphagia 07/06/2012  . Essential hypertension, benign 01/01/2010  . COPD 01/01/2010  . GERD 01/01/2010    End of Session Activity Tolerance: Patient tolerated treatment well General Behavior During Therapy: [facility] for tasks assessed/performed  GO    Donney Rankins, OTR/L  10/16/2013, 4:26 PM

## 2013-10-18 ENCOUNTER — Ambulatory Visit (HOSPITAL_COMMUNITY)
Admission: RE | Admit: 2013-10-18 | Discharge: 2013-10-18 | Disposition: A | Discharge status: Home / Self Care | Payer: Medicare Other | Source: Ambulatory Visit | Attending: Pulmonary Disease | Admitting: Pulmonary Disease

## 2013-10-18 NOTE — Progress Notes (Signed)
Occupational Therapy Treatment Patient Details  Name: Lauren [name] MRN: [id number] Date of Birth: [birth year]  Today's Date: 10/18/2013 Time: 1523-1610 OT Time Calculation (min): 47 min Manual [id number]') Ultrasound 1538-1550 (12') TherExercises 1550-1610 (20')  Visit#: 6 of 24  Re-eval: 11/01/13    Authorization: [facility] Medicare   Authorization Time Period: Before 10th visit  Authorization Visit#: 6 of 10  Subjective Symptoms/Limitations Symptoms: S:  I am using my hand alot more today  i noticed. Pain Assessment Currently in Pain?: Yes Pain Score: 2  Pain Location: Wrist Pain Orientation: Right Pain Type: Acute pain;Surgical pain Multiple Pain Sites: No   Exercise/Treatments Hand Exercises MCPJ Flexion: PROM;10 reps;AAROM;5 reps MCPJ Extension: PROM;10 reps;AAROM;5 reps PIPJ Flexion: PROM;10 reps;AAROM;5 reps PIPJ Extension: PROM;10 reps;AAROM;5 reps DIPJ Flexion: PROM;10 reps;AAROM;5 reps DIPJ Extension: PROM;10 reps;AAROM Thumb Opposition: x10  Other Hand Exercises: hand walk from flat as can be on surface for digit extension with  hold at end range.  Other Hand Exercises: full composite finger flexion x 10 reps      Modalities Modalities: Ultrasound Manual Therapy Manual Therapy: Myofascial release Myofascial Release: MFR and manual stretching to flexor and extensor forearm, wrist and hand to decrease pain and fascial restrictions for greated wrist and digit mobility Ultrasound Ultrasound Location: palmar surface, lateral radial head/wrist Ultrasound Parameters: 44mins each site; 0.6 w/cm2 pulsed  Ultrasound Goals: Edema;Pain;Other (Comment)  Occupational Therapy Assessment and Plan OT Assessment and Plan Clinical Impression Statement: A: Patient demos increased digit flexion  and extension this date. Patient with  report of increased time on splint wear.  decreased complaint of pain and tightness this date.  OT Plan: P: Begin wrist AROM and theraputty  exercises.    Goals Short Term Goals Short Term Goal 1: Patient will be educated on HEP  Short Term Goal 1 Progress: Progressing toward goal Short Term Goal 2: Patietn will increase right grip strength by 5 pounds and pinch by 1 pound to increase ability to complete ADL dressing tasks.  Short Term Goal 2 Progress: Progressing toward goal Short Term Goal 3: Patient will improve wrist AROM by 5 for increased ability to complete toileting hygiene Short Term Goal 3 Progress: Progressing toward goal Short Term Goal 4: Patient will improve composite finger flexion with right hand to >/= 75%to improve ability to grasp drink Short Term Goal 4 Progress: Progressing toward goal Short Term Goal 5: Patient will decrease 9 hole peg time by 2 seconds to increase ability to tie shoes.  Short Term Goal 5 Progress: Progressing toward goal Long Term Goals Long Term Goal 1: Patient will return to prior level of independence with all B/IADLs and leisure activities utilizing her dominant right hand  Long Term Goal 1 Progress: Progressing toward goal Long Term Goal 2: Patient will increase right grip strength to >/= 25 pounds and pinch by 5 pounds for increased ability to open containers Long Term Goal 2 Progress: Progressing toward goal Long Term Goal 3: Patient will increase wrist AROM by 10 degrees for increased ability to comb hair and brush teeth with her right dominant hand. Long Term Goal 3 Progress: Progressing toward goal Long Term Goal 4: Patient will complete Nine Hole Peg score with R hand in under 23 seconds for increased ability to manipulate coins, buttons/zippers and tie shoes  Long Term Goal 4 Progress: Progressing toward goal Long Term Goal 5: Patient will demo ability to sign name legibly 3/3 trials  Long Term Goal 5 Progress: Progressing toward goal  Problem  List Patient Active Problem List   Diagnosis Date Noted  . History of hand surgery 09/14/2013  . De Quervain's syndrome  (tenosynovitis) 09/02/2013  . Trigger finger, acquired 09/02/2013  . Distal radius fracture 08/03/2013  . Finger pain 08/03/2013  . Left wrist pain 08/03/2013  . Sprain of right middle finger 08/03/2013  . De Quervain's disease (radial styloid tenosynovitis) 11/01/2012  . Precordial pain 09/30/2012  . Mixed hyperlipidemia 09/30/2012  . Lower abdominal pain 07/06/2012  . Umbilical hernia [id number]  . Esophageal dysphagia 07/06/2012  . Essential hypertension, benign 01/01/2010  . COPD 01/01/2010  . GERD 01/01/2010    End of Session Activity Tolerance: Patient tolerated treatment well General Behavior During Therapy: [facility] for tasks assessed/performed  GO    Donney Rankins, OTR/L  10/18/2013, 5:22 PM

## 2013-10-23 ENCOUNTER — Ambulatory Visit (HOSPITAL_COMMUNITY)
Admission: RE | Admit: 2013-10-23 | Discharge: 2013-10-23 | Disposition: A | Discharge status: Home / Self Care | Payer: Medicare Other | Source: Ambulatory Visit | Attending: Pulmonary Disease | Admitting: Pulmonary Disease

## 2013-10-23 DIAGNOSIS — M79609 Pain in unspecified limb: Secondary | ICD-10-CM | POA: Insufficient documentation

## 2013-10-23 DIAGNOSIS — M25539 Pain in unspecified wrist: Secondary | ICD-10-CM | POA: Insufficient documentation

## 2013-10-23 DIAGNOSIS — IMO0001 Reserved for inherently not codable concepts without codable children: Secondary | ICD-10-CM | POA: Insufficient documentation

## 2013-10-23 DIAGNOSIS — M6281 Muscle weakness (generalized): Secondary | ICD-10-CM | POA: Insufficient documentation

## 2013-10-23 DIAGNOSIS — I10 Essential (primary) hypertension: Secondary | ICD-10-CM | POA: Insufficient documentation

## 2013-10-23 DIAGNOSIS — R279 Unspecified lack of coordination: Secondary | ICD-10-CM | POA: Insufficient documentation

## 2013-10-23 DIAGNOSIS — J4489 Other specified chronic obstructive pulmonary disease: Secondary | ICD-10-CM | POA: Insufficient documentation

## 2013-10-23 DIAGNOSIS — M25649 Stiffness of unspecified hand, not elsewhere classified: Secondary | ICD-10-CM | POA: Insufficient documentation

## 2013-10-23 DIAGNOSIS — J449 Chronic obstructive pulmonary disease, unspecified: Secondary | ICD-10-CM | POA: Insufficient documentation

## 2013-10-23 NOTE — Evaluation (Addendum)
Occupational Therapy Re-Evaluation  Patient Details  Name: Lauren [name] MRN: [id number] Date of Birth: Jan 22, 1934  Today's Date: 10/23/2013 Time: [id number] OT Time Calculation (min): 46 min Reassessment [id number] (15') Ultrasound O9450146 (12') Manual K5443470 (59')  Visit#: 7 of 24  Re-eval: 11/20/13  Assessment Diagnosis: Right 3rd digit trigger finger release and de Quervain's release  Authorization: [facility] Medicare   Authorization Time Period: Before 17th visit  Authorization Visit#: 7 of 17   Past Medical History:  Past Medical History  Diagnosis Date  . Asthma   . Osteoarthritis   . Essential hypertension, benign   . Hyperlipidemia   . Back pain   . GERD (gastroesophageal reflux disease)   . History of cardiac catheterization     Reportedly normal coronaries in 2000  . COPD (chronic obstructive pulmonary disease)     O2 at night sometimes   Past Surgical History:  Past Surgical History  Procedure Laterality Date  . Shoulder surgery      Rotator Cuff  . Ankle surgery      Rod and 13 screws  . Rotator cuff repair    . Appendectomy    . Breast biopsy  1996    Benign  . Abdominal hysterectomy  1994  . Colonoscopy  01/27/2011    Normal rectum/Sigmoid diverticula/Multiple colonic polyps removed (TUBULAR ADENOMA) Next TCS 01/2016  . Esophagogastroduodenoscopy  08/21/2003       Normal tubular esophagus status post passage of Maloney dilator as  described above/  2. Multiple linear antral erosions of uncertain significance, could be NSAID  . Esophagogastroduodenoscopy  08/04/2012    Procedure: ESOPHAGOGASTRODUODENOSCOPY (EGD);  Surgeon: Daneil Dolin, MD;  Location: [facility];  Service: Endoscopy;  Laterality: N/A;  7:30  . Esophageal dilation  08/04/2012    Procedure: ESOPHAGEAL DILATION;  Surgeon: Daneil Dolin, MD;  Location: [facility];  Service: Endoscopy;;  . Dorsal compartment release Left 02/03/2013    Procedure: RELEASE DORSAL COMPARTMENT  (DEQUERVAIN);  Surgeon: Carole Civil, MD;  Location: [facility];  Service: Orthopedics;  Laterality: Left;  . Trigger finger release Right 09/11/2013    Procedure: RIGHT LONG FINGER RELEASE TRIGGER FINGER;  Surgeon: Carole Civil, MD;  Location: [facility];  Service: Orthopedics;  Laterality: Right;  . Dorsal compartment release Right 09/11/2013    Procedure: RELEASE DORSAL COMPARTMENT (DEQUERVAIN);  Surgeon: Carole Civil, MD;  Location: [facility];  Service: Orthopedics;  Laterality: Right;    Subjective Symptoms/Limitations Symptoms: S:  this hand is so stiff when i wake up in the morning. Pain Assessment Currently in Pain?: Yes Pain Score: 4  Pain Location: Hand (distal wrist and hand ) Pain Type: Acute pain Multiple Pain Sites: No     Balance Screening Balance Screen Has the patient fallen in the past 6 months: No     Assessment ADL/Vision/Perception ADL ADL Comments: Patient reports increased functional use of right UE in daily tasks  however cont with difficulty doing her hair and lifting heavy objects  Dominant Hand: Right  Cognition/Observation Cognition Overall Cognitive Status: Within Functional Limits for tasks assessed Observation/Other Assessments Observations: well healed surgical incisions to palmar surface and at radial head of right extremity   Sensation/Coordination/Edema Sensation Light Touch: Appears Intact Coordination Gross Motor Movements are Fluid and Coordinated: Yes Fine Motor Movements are Fluid and Coordinated: Yes 9 Hole Peg Test: R: 23.43 (r: 26.72) Edema Edema: (in cm)... R: wrist 17.5 , MP 20.0, L PIP 7.2 (R: wrist 18.2, MP  21.3, L PIP 7.9)  Additional Assessments RUE AROM (degrees) RUE Overall AROM Comments: flexion: 1-44/76 (32/50); 2- 54/92/44 (50/82/28); 3- 72/90/42 (52/76/26); 4- 80/94/38 (48/78/22); 5- 82/94/56(62/60/34).  extension: 3rd PIP -32 (-46) Right Wrist Extension: 58 Degrees (48) Right Wrist Flexion: 26 Degrees  (26) Right Composite Finger Flexion: 75% Right Thumb Opposition: Digit 2;Digit 3;Digit 4;Digit 5 (cont difficulty to 5th (eval - D2, D3 only)) RUE Strength Right Wrist Flexion: 3/5 Right Wrist Extension: 3/5 Grip (lbs): 18 (6) Lateral Pinch: 9 lbs (4) 3 Point Pinch: 5 lbs (0) LUE Assessment LUE Assessment: Within Functional Limits Palpation Palpation: mod fascial restrictions palmar surface of hand; min base of CMC  Right Hand Strength - Pinch (lbs) Lateral Pinch: 9 lbs (4) 3 Point Pinch: 5 lbs (0)   Exercise/Treatments {Manual Therapy Manual Therapy: Myofascial release Myofascial Release: MFR and manual stretching to flexor and extensor forearm, wrist and hand to decrease pain and fascial restrictions for greated wrist and digit mobility Ultrasound Ultrasound Location: palmar surface, lateral radial head/wrist Ultrasound Parameters: 78mins each site; 0.6 w/cm2 continuous Ultrasound Goals: Edema;Pain;Other (Comment)  Occupational Therapy Assessment and Plan OT Assessment and Plan Clinical Impression Statement: A:  Reassessment this date for MD appt tomorrow.  Patient demos improved composite finger flexion to >75% with no complaint of significant pain.  She has improved the 9hole peg score by 3.3 seconds.  Grip by 12#.  She reports and demos increased functional use of RUE in daily tasks with good success and some pain when trying to lift objects and when styling her hair.  Patient presents with well healed incisions  and is independent with scar massage.  She has met 4/5STGs and 1/5 LTGs with excellent progress towards remaining.   OT Plan: P: Begin wrist AROM and theraputty exercises.    Goals Short Term Goals Short Term Goal 1: Patient will be educated on HEP  Short Term Goal 1 Progress: Progressing toward goal Short Term Goal 2: Patietn will increase right grip strength by 5 pounds and pinch by 1 pound to increase ability to complete ADL dressing tasks.  Short Term Goal 2  Progress: Met Short Term Goal 3: Patient will improve wrist AROM by 5 for increased ability to complete toileting hygiene Short Term Goal 3 Progress: Met Short Term Goal 4: Patient will improve composite finger flexion with right hand to >/= 75%to improve ability to grasp drink Short Term Goal 4 Progress: Met Short Term Goal 5: Patient will decrease 9 hole peg time by 2 seconds to increase ability to tie shoes.  Short Term Goal 5 Progress: Met Long Term Goals Long Term Goal 1: Patient will return to prior level of independence with all B/IADLs and leisure activities utilizing her dominant right hand  Long Term Goal 1 Progress: Progressing toward goal Long Term Goal 2: Patient will increase right grip strength to >/= 25 pounds and pinch by 5 pounds for increased ability to open containers Long Term Goal 2 Progress: Progressing toward goal Long Term Goal 3: Patient will increase wrist AROM by 10 degrees for increased ability to comb hair and brush teeth with her right dominant hand. Long Term Goal 3 Progress: Met Long Term Goal 4: Patient will complete Nine Hole Peg score with R hand in under 23 seconds for increased ability to manipulate coins, buttons/zippers and tie shoes  Long Term Goal 4 Progress: Progressing toward goal Long Term Goal 5: Patient will demo ability to sign name legibly 3/3 trials  Long Term Goal 5  Progress: Progressing toward goal  Problem List Patient Active Problem List   Diagnosis Date Noted  . History of hand surgery 09/14/2013  . De Quervain's syndrome (tenosynovitis) 09/02/2013  . Trigger finger, acquired 09/02/2013  . Distal radius fracture 08/03/2013  . Finger pain 08/03/2013  . Left wrist pain 08/03/2013  . Sprain of right middle finger 08/03/2013  . De Quervain's disease (radial styloid tenosynovitis) 11/01/2012  . Precordial pain 09/30/2012  . Mixed hyperlipidemia 09/30/2012  . Lower abdominal pain 07/06/2012  . Umbilical hernia [id number]  . Esophageal  dysphagia 07/06/2012  . Essential hypertension, benign 01/01/2010  . COPD 01/01/2010  . GERD 01/01/2010    End of Session Activity Tolerance: Patient tolerated treatment well General Behavior During Therapy: [facility] for tasks assessed/performed  GO Functional Assessment Tool Used: 9 hole peg score R: 23.43 (26.72 eval);   Grip R: 18# (6# eval) Functional Limitation: Carrying, moving and handling objects Carrying, Moving and Handling Objects Current Status [phone]): At least 40 percent but less than 60 percent impaired, limited or restricted Carrying, Moving and Handling Objects Goal Status [phone]): At least 1 percent but less than 20 percent impaired, limited or restricted  Donney Rankins, OTR/L  10/23/2013, 2:33 PM  Physician Documentation Your signature is required to indicate approval of the treatment plan as stated above.  Please sign and either send electronically or make a copy of this report for your files and return this physician signed original.  Please mark one 1.__approve of plan  2. ___approve of plan with the following conditions.   ______________________________                                                          _____________________ Physician Signature                                                                                                             Date

## 2013-10-24 ENCOUNTER — Ambulatory Visit (INDEPENDENT_AMBULATORY_CARE_PROVIDER_SITE_OTHER): Payer: Self-pay | Admitting: Orthopedic Surgery

## 2013-10-24 ENCOUNTER — Ambulatory Visit (HOSPITAL_COMMUNITY): Payer: Medicare Other | Admitting: Occupational Therapy

## 2013-10-24 VITALS — BP 152/86 | Ht 63.0 in | Wt 181.0 lb

## 2013-10-24 DIAGNOSIS — M654 Radial styloid tenosynovitis [de Quervain]: Secondary | ICD-10-CM

## 2013-10-24 DIAGNOSIS — S63612D Unspecified sprain of right middle finger, subsequent encounter: Secondary | ICD-10-CM

## 2013-10-24 DIAGNOSIS — Z5189 Encounter for other specified aftercare: Secondary | ICD-10-CM

## 2013-10-24 DIAGNOSIS — M6530 Trigger finger, unspecified finger: Secondary | ICD-10-CM

## 2013-10-24 NOTE — Progress Notes (Signed)
Patient ID: Lauren [name], female   DOB: 1934-10-05, 79 y.o.   MRN: [id number]  Status post right de Quervain's release and right long finger trigger release she also has a PIP joint injury from a fall no fracture  Overall doing well pain is significantly better. She was able to Thanksgiving dinner. Wounds look good. She's tender over the incision. She is just some loss of flexion in the PIP joint of the right long finger and has an extensor lag there is well  Continue splinting the PIP joint as needed followup in 3 months for recheck

## 2013-10-24 NOTE — Patient Instructions (Signed)
Continue to wear splint as needed

## 2013-10-26 ENCOUNTER — Ambulatory Visit (HOSPITAL_COMMUNITY)
Admission: RE | Admit: 2013-10-26 | Discharge: 2013-10-26 | Disposition: A | Discharge status: Home / Self Care | Payer: Medicare Other | Source: Ambulatory Visit | Attending: Pulmonary Disease | Admitting: Pulmonary Disease

## 2013-10-26 NOTE — Progress Notes (Signed)
Occupational Therapy Treatment Patient Details  Name: Lauren [name] MRN: [id number] Date of Birth: 05-29-1934  Today's Date: 10/26/2013 Time: [id number] OT Time Calculation (min): 43 min Manual [id number] (10') Ultrasound 1448-1500 (12') TherExercises [id number] (21')  Visit#: 8 of 24  Re-eval: 11/20/13    Authorization: [employer]   Authorization Time Period: Before 17th visit  Authorization Visit#: 8 of 17  Subjective Symptoms/Limitations Symptoms: S:  I felt a litttle pop yesterday with my stretches but this morning it was just as stiff as it alwasy was  Pain Assessment Pain Score: 2  Pain Location: Hand (distal wrist and hand) Pain Orientation: Right Pain Type: Acute pain Multiple Pain Sites: No     Exercise/Treatments Theraputty: Flatten;Roll;Grip;Pinch (digit extension in putty; putty press with 3/4" dowel x 35) Theraputty - Flatten: yellow Theraputty - Roll: yellow (roll bilateral hands; roll utilzing digit extension.) Theraputty - Grip: yellow Theraputty - Pinch: yellow (lateral and 3pt pinch )  Hand Exercises MCPJ Flexion: AAROM;15 reps MCPJ Extension: PROM;AAROM;15 reps (3rd intermittent stretch x 33mins PROM) PIPJ Flexion: AAROM;15 reps PIPJ Extension: PROM;AAROM (3rd intermittent stretch x 46mins PROM) DIPJ Flexion: AAROM;15 reps DIPJ Extension: PROM;AAROM;15 reps (3rd intermittent stretch x 46mins PROM ) Theraputty: Flatten;Roll;Grip;Pinch (digit extension in putty; putty press with 3/4" dowel x 35) Theraputty - Flatten: yellow Theraputty - Roll: yellow (roll bilateral hands; roll utilzing digit extension.) Theraputty - Grip: yellow Theraputty - Pinch: yellow (lateral and 3pt pinch )     Modalities Modalities: Ultrasound Manual Therapy Manual Therapy: Myofascial release Myofascial Release: MFR and manual stretching to flexor and extensor forearm, wrist and hand to decrease pain and fascial restrictions for greated wrist and digit  mobility Ultrasound Ultrasound Location: palmar surface, lateral radial head/wrist Ultrasound Parameters: 39mins each site; 0.6 w/cm2, 1Mhz,  continuous Ultrasound Goals: Edema;Pain  Occupational Therapy Assessment and Plan OT Assessment and Plan Clinical Impression Statement: A:  Patient reports MD appt went well on 12/02.  She demos improving extension in 3rd PIP of right hand.  She demos mod difficulty with putty activities this date however completed and verbalized good workout without increasd pain following.   OT Plan: P: Wrist AROM, theraputty exercises   Goals Short Term Goals Short Term Goal 1: Patient will be educated on HEP  Short Term Goal 1 Progress: Progressing toward goal Short Term Goal 2: Patietn will increase right grip strength by 5 pounds and pinch by 1 pound to increase ability to complete ADL dressing tasks.  Short Term Goal 2 Progress: Met Short Term Goal 3: Patient will improve wrist AROM by 5 for increased ability to complete toileting hygiene Short Term Goal 3 Progress: Met Short Term Goal 4: Patient will improve composite finger flexion with right hand to >/= 75%to improve ability to grasp drink Short Term Goal 4 Progress: Met Short Term Goal 5: Patient will decrease 9 hole peg time by 2 seconds to increase ability to tie shoes.  Short Term Goal 5 Progress: Met Long Term Goals Long Term Goal 1: Patient will return to prior level of independence with all B/IADLs and leisure activities utilizing her dominant right hand  Long Term Goal 1 Progress: Progressing toward goal Long Term Goal 2: Patient will increase right grip strength to >/= 25 pounds and pinch by 5 pounds for increased ability to open containers Long Term Goal 2 Progress: Progressing toward goal Long Term Goal 3: Patient will increase wrist AROM by 10 degrees for increased ability to comb hair and brush teeth with her  right dominant hand. Long Term Goal 3 Progress: Met Long Term Goal 4: Patient will  complete Nine Hole Peg score with R hand in under 23 seconds for increased ability to manipulate coins, buttons/zippers and tie shoes  Long Term Goal 4 Progress: Progressing toward goal Long Term Goal 5: Patient will demo ability to sign name legibly 3/3 trials  Long Term Goal 5 Progress: Progressing toward goal  Problem List Patient Active Problem List   Diagnosis Date Noted  . History of hand surgery 09/14/2013  . De Quervain's syndrome (tenosynovitis) 09/02/2013  . Trigger finger, acquired 09/02/2013  . Distal radius fracture 08/03/2013  . Finger pain 08/03/2013  . Left wrist pain 08/03/2013  . Sprain of right middle finger 08/03/2013  . De Quervain's disease (radial styloid tenosynovitis) 11/01/2012  . Precordial pain 09/30/2012  . Mixed hyperlipidemia 09/30/2012  . Lower abdominal pain 07/06/2012  . Umbilical hernia [id number]  . Esophageal dysphagia 07/06/2012  . Essential hypertension, benign 01/01/2010  . COPD 01/01/2010  . GERD 01/01/2010    End of Session Activity Tolerance: Patient tolerated treatment well General Behavior During Therapy: [facility] for tasks assessed/performed  GO    Donney Rankins, OTR/L 10/26/2013, 4:30 PM

## 2013-10-31 ENCOUNTER — Ambulatory Visit (HOSPITAL_COMMUNITY)
Admission: RE | Admit: 2013-10-31 | Discharge: 2013-10-31 | Disposition: A | Discharge status: Home / Self Care | Payer: Medicare Other | Source: Ambulatory Visit | Attending: Pulmonary Disease | Admitting: Pulmonary Disease

## 2013-11-01 NOTE — Progress Notes (Signed)
Occupational Therapy Treatment Patient Details  Name: Lauren [name] MRN: [id number] Date of Birth: 02-Jun-1934  Today's Date: 10/31/2013 Time: [id number] OT Time Calculation (min): 45 min Manual 1435-1453 (18') Ultrasound [id number] (12') TherExercises 1505-1520 (15')  Visit#: 9 of 24  Re-eval: 11/20/13    Authorization: [employer]   Authorization Time Period: Before 17th visit  Authorization Visit#: 9 of 17  Subjective Symptoms/Limitations Symptoms: S:  I dont know what has happened to the other side of my wrist but it hurt me so bad last night..... Pain Assessment Currently in Pain?: Yes Pain Score: 2  Pain Location: Hand (distal wrist/hand) Pain Orientation: Right Pain Type: Acute pain Multiple Pain Sites: No   Exercise/Treatments Wrist Exercises Forearm Supination: PROM;AROM;10 reps Forearm Pronation: PROM;AROM;10 reps Wrist Flexion: PROM;AAROM;10 reps Wrist Extension: PROM;AAROM;10 reps Wrist Radial Deviation: PROM;AAROM;10 reps Wrist Ulnar Deviation: PROM;AAROM;10 reps         Hand Exercises MCPJ Flexion: AAROM;15 reps MCPJ Extension: PROM;AAROM;15 reps PIPJ Flexion: AAROM;15 reps PIPJ Extension: PROM;AAROM DIPJ Flexion: AAROM;15 reps DIPJ Extension: PROM;AAROM;15 reps Thumb Opposition: x10  (to palmar crease ) Tendon Glides: 10 x Other Hand Exercises: hand walk from flat as can be on surface for digit extension with  hold at end range.      Modalities Modalities: Ultrasound Manual Therapy Manual Therapy: Myofascial release Myofascial Release: MFR and manual stretching to flexor and extensor forearm, wrist and hand to decrease pain and fascial restrictions for greated wrist and digit mobility Ultrasound Ultrasound Location: palmar surface, lateral radial head/wrist Ultrasound Parameters: 55mins each site; 0.6 w/cm2, 1Mhz, continuous Ultrasound Goals: Edema;Pain  Occupational Therapy Assessment and Plan OT Assessment and Plan Clinical Impression  Statement: A:  Patient present with increased range this date and increased tolerance for grasping activities.  she reports increased functional use in home setting esp in her kitchen however cont with increased pain in wrist area.  OT Plan: P:  add light weight to wrist AROM, theraputty for HEP   Goals Short Term Goals Short Term Goal 1: Patient will be educated on HEP  Short Term Goal 1 Progress: Progressing toward goal Short Term Goal 2: Patietn will increase right grip strength by 5 pounds and pinch by 1 pound to increase ability to complete ADL dressing tasks.  Short Term Goal 2 Progress: Met Short Term Goal 3: Patient will improve wrist AROM by 5 for increased ability to complete toileting hygiene Short Term Goal 3 Progress: Met Short Term Goal 4: Patient will improve composite finger flexion with right hand to >/= 75%to improve ability to grasp drink Short Term Goal 4 Progress: Met Short Term Goal 5: Patient will decrease 9 hole peg time by 2 seconds to increase ability to tie shoes.  Short Term Goal 5 Progress: Met Long Term Goals Long Term Goal 1: Patient will return to prior level of independence with all B/IADLs and leisure activities utilizing her dominant right hand  Long Term Goal 1 Progress: Progressing toward goal Long Term Goal 2: Patient will increase right grip strength to >/= 25 pounds and pinch by 5 pounds for increased ability to open containers Long Term Goal 2 Progress: Progressing toward goal Long Term Goal 3: Patient will increase wrist AROM by 10 degrees for increased ability to comb hair and brush teeth with her right dominant hand. Long Term Goal 3 Progress: Met Long Term Goal 4: Patient will complete Nine Hole Peg score with R hand in under 23 seconds for increased ability to manipulate coins,  buttons/zippers and tie shoes  Long Term Goal 4 Progress: Progressing toward goal Long Term Goal 5: Patient will demo ability to sign name legibly 3/3 trials  Long Term  Goal 5 Progress: Progressing toward goal  Problem List Patient Active Problem List   Diagnosis Date Noted  . History of hand surgery 09/14/2013  . De Quervain's syndrome (tenosynovitis) 09/02/2013  . Trigger finger, acquired 09/02/2013  . Distal radius fracture 08/03/2013  . Finger pain 08/03/2013  . Left wrist pain 08/03/2013  . Sprain of right middle finger 08/03/2013  . De Quervain's disease (radial styloid tenosynovitis) 11/01/2012  . Precordial pain 09/30/2012  . Mixed hyperlipidemia 09/30/2012  . Lower abdominal pain 07/06/2012  . Umbilical hernia [id number]  . Esophageal dysphagia 07/06/2012  . Essential hypertension, benign 01/01/2010  . COPD 01/01/2010  . GERD 01/01/2010    End of Session Activity Tolerance: Patient tolerated treatment well General Behavior During Therapy: [facility] for tasks assessed/performed  GO    Donney Rankins, OTR/L 10/31/2013, 4:02 PM

## 2013-11-02 ENCOUNTER — Ambulatory Visit (HOSPITAL_COMMUNITY)
Admission: RE | Admit: 2013-11-02 | Discharge: 2013-11-02 | Disposition: A | Discharge status: Home / Self Care | Payer: Medicare Other | Source: Ambulatory Visit | Attending: Pulmonary Disease | Admitting: Pulmonary Disease

## 2013-11-02 NOTE — Progress Notes (Signed)
Occupational Therapy Treatment Patient Details  Name: Lauren [name] MRN: [id number] Date of Birth: 1934/08/26  Today's Date: 11/02/2013 Time: [id number] OT Time Calculation (min): 56 min Paraffin 1347-1402 (15') Ultrasound 1402-1414 (12') Manual 1414-1429 (15') TherExercises 1429-1443 (14')  Visit#: 10 of 24  Re-eval: 11/20/13    Authorization: [employer]   Authorization Time Period: Before 17th visit  Authorization Visit#: 10 of 17  Subjective Symptoms/Limitations Symptoms: S: It has been so sore in the palm of my hand but you know it feels better when it is warm.  Pain Assessment Currently in Pain?: Yes Pain Score: 2  Pain Location: Hand (distal wrist/hand ) Pain Orientation: Right Pain Type: Acute pain Multiple Pain Sites: No     Exercise/Treatments Wrist Exercises Wrist Flexion: AAROM;10 reps Wrist Extension: AAROM;10 reps Wrist Radial Deviation: AAROM;10 reps Wrist Ulnar Deviation: AAROM;10 reps      Hand Exercises MCPJ Flexion: AAROM;15 reps MCPJ Extension: PROM;AAROM;15 reps PIPJ Flexion: AAROM;15 reps PIPJ Extension: PROM;AAROM DIPJ Flexion: AAROM;15 reps DIPJ Extension: PROM;AAROM;15 reps Other Hand Exercises: hand walk from flat as can be on surface for digit extension with  hold at end range.      Modalities Modalities: Ultrasound;Moist Heat Manual Therapy Manual Therapy: Myofascial release Myofascial Release: MFR and manual stretching to flexor and extensor forearm, wrist and hand to decrease pain and fascial restrictions for greated wrist and digit mobility Moist Heat Therapy Number Minutes Moist Heat: 12 Minutes (in conjunction with paraffin) Moist Heat Location: Hand;Wrist Ultrasound Ultrasound Location: palmar surface, lateral radial head/wrist Ultrasound Parameters: 25mins each site; 0.6 w/cm2, 1Mhz, continuous Ultrasound Goals: Edema;Pain Weight Bearing Technique Weight Bearing Technique: No Activities of Daily Living Activities of  Daily Living: paraffin to Right hand x 15 min with MHP applied x 12 for pain, scar management and softening of tissues.   Occupational Therapy Assessment and Plan OT Assessment and Plan Clinical Impression Statement: A: Patient with complaint of tenderness palmar surface.  utilized paraffin with patient this date for warming tissues, scar management to increase mobility of wrist and hand this date as well as pain management.  patient demos imrproving 3rd digit extension this date. Reports good follow through with home HEPs and scar massage.   OT Plan: P:  add light weight to wrist AROM, theraputty for HEP   Goals Short Term Goals Short Term Goal 1: Patient will be educated on HEP  Short Term Goal 1 Progress: Progressing toward goal Short Term Goal 2: Patietn will increase right grip strength by 5 pounds and pinch by 1 pound to increase ability to complete ADL dressing tasks.  Short Term Goal 2 Progress: Met Short Term Goal 3: Patient will improve wrist AROM by 5 for increased ability to complete toileting hygiene Short Term Goal 3 Progress: Met Short Term Goal 4: Patient will improve composite finger flexion with right hand to >/= 75%to improve ability to grasp drink Short Term Goal 4 Progress: Met Short Term Goal 5: Patient will decrease 9 hole peg time by 2 seconds to increase ability to tie shoes.  Short Term Goal 5 Progress: Met Long Term Goals Long Term Goal 1: Patient will return to prior level of independence with all B/IADLs and leisure activities utilizing her dominant right hand  Long Term Goal 1 Progress: Progressing toward goal Long Term Goal 2: Patient will increase right grip strength to >/= 25 pounds and pinch by 5 pounds for increased ability to open containers Long Term Goal 2 Progress: Progressing toward goal Long Term  Goal 3: Patient will increase wrist AROM by 10 degrees for increased ability to comb hair and brush teeth with her right dominant hand. Long Term Goal 3  Progress: Met Long Term Goal 4: Patient will complete Nine Hole Peg score with R hand in under 23 seconds for increased ability to manipulate coins, buttons/zippers and tie shoes  Long Term Goal 4 Progress: Progressing toward goal Long Term Goal 5: Patient will demo ability to sign name legibly 3/3 trials  Long Term Goal 5 Progress: Progressing toward goal  Problem List Patient Active Problem List   Diagnosis Date Noted  . History of hand surgery 09/14/2013  . De Quervain's syndrome (tenosynovitis) 09/02/2013  . Trigger finger, acquired 09/02/2013  . Distal radius fracture 08/03/2013  . Finger pain 08/03/2013  . Left wrist pain 08/03/2013  . Sprain of right middle finger 08/03/2013  . De Quervain's disease (radial styloid tenosynovitis) 11/01/2012  . Precordial pain 09/30/2012  . Mixed hyperlipidemia 09/30/2012  . Lower abdominal pain 07/06/2012  . Umbilical hernia [id number]  . Esophageal dysphagia 07/06/2012  . Essential hypertension, benign 01/01/2010  . COPD 01/01/2010  . GERD 01/01/2010    End of Session Activity Tolerance: Patient tolerated treatment well General Behavior During Therapy: [facility] for tasks assessed/performed  GO    Donney Rankins, OTR/L  11/02/2013, 4:33 PM

## 2013-11-06 ENCOUNTER — Ambulatory Visit (HOSPITAL_COMMUNITY)
Admission: RE | Admit: 2013-11-06 | Discharge: 2013-11-06 | Disposition: A | Discharge status: Home / Self Care | Payer: Medicare Other | Source: Ambulatory Visit | Attending: Pulmonary Disease | Admitting: Pulmonary Disease

## 2013-11-10 ENCOUNTER — Ambulatory Visit (HOSPITAL_COMMUNITY)
Admission: RE | Admit: 2013-11-10 | Discharge: 2013-11-10 | Disposition: A | Discharge status: Home / Self Care | Payer: Medicare Other | Source: Ambulatory Visit | Attending: Pulmonary Disease | Admitting: Pulmonary Disease

## 2013-11-13 ENCOUNTER — Ambulatory Visit (HOSPITAL_COMMUNITY)
Admission: RE | Admit: 2013-11-13 | Discharge: 2013-11-13 | Disposition: A | Discharge status: Home / Self Care | Payer: Medicare Other | Source: Ambulatory Visit | Attending: Pulmonary Disease | Admitting: Pulmonary Disease

## 2013-11-17 ENCOUNTER — Ambulatory Visit (HOSPITAL_COMMUNITY): Payer: Medicare Other | Admitting: Occupational Therapy

## 2013-11-20 ENCOUNTER — Ambulatory Visit (HOSPITAL_COMMUNITY)
Admission: RE | Admit: 2013-11-20 | Discharge: 2013-11-20 | Disposition: A | Discharge status: Home / Self Care | Payer: Medicare Other | Source: Ambulatory Visit | Attending: Pulmonary Disease | Admitting: Pulmonary Disease

## 2013-11-21 NOTE — Progress Notes (Addendum)
Occupational Therapy Treatment Patient Details  Name: Lauren [name] MRN: [id number] Date of Birth: Jan 10, 1934  Today's Date: 11/06/2013 Time: [id number] OT Time Calculation (min): 47 min Paraffin with MHP 1345-1400 (15') Ultrasound 1400-1412 (12') Manual 1412-1432 (20')  Visit#: 11 of 24  Re-eval: 11/20/13    Authorization: [employer]   Authorization Time Period: 1  Authorization Visit#: 11 of 17  Subjective Symptoms/Limitations Symptoms: S:  I feel like I can get it straight it just wont stay that way Pain Assessment Currently in Pain?: Yes Pain Score: 4  Pain Location: Hand (distal wrist/palm) Pain Orientation: Right Pain Type: Acute pain     Exercise/Treatments     Modalities Modalities: Ultrasound;Moist Heat Manual Therapy Manual Therapy: Myofascial release Myofascial Release: MFR and manual stretching to flexor and extensor forearm, wrist and hand to decrease pain and fascial restrictions for greated wrist and digit mobility Moist Heat Therapy Number Minutes Moist Heat: 12 Minutes (in conjunction with paraffin) Moist Heat Location: Hand;Wrist Ultrasound Ultrasound Location: palmar surface, lateral radial head/wrist Ultrasound Parameters: 51mins each site; 0.6 w/cm2, 1Mhz, continuous Ultrasound Goals: Edema;Pain Activities of Daily Living Activities of Daily Living: paraffin to Right hand x 15 min with MHP  for pain, scar management and softening of tissues.   Occupational Therapy Assessment and Plan OT Assessment and Plan Clinical Impression Statement: A:  Patient with slight increased complaint of pain in wrist area this date.  increased focus on manual techniques this date with agressive stretching to 3rd digit with distraction.  increased friction, MFR to wrist area for contiued edema and increased pain.  OT Plan: P:  add light weight to wrist AROM, theraputty for HEP   Goals Short Term Goals Short Term Goal 1: Patient will be educated on HEP  Short  Term Goal 1 Progress: Progressing toward goal Short Term Goal 2: Patietn will increase right grip strength by 5 pounds and pinch by 1 pound to increase ability to complete ADL dressing tasks.  Short Term Goal 2 Progress: Met Short Term Goal 3: Patient will improve wrist AROM by 5 for increased ability to complete toileting hygiene Short Term Goal 3 Progress: Met Short Term Goal 4: Patient will improve composite finger flexion with right hand to >/= 75%to improve ability to grasp drink Short Term Goal 4 Progress: Met Short Term Goal 5: Patient will decrease 9 hole peg time by 2 seconds to increase ability to tie shoes.  Short Term Goal 5 Progress: Met Long Term Goals Long Term Goal 1: Patient will return to prior level of independence with all B/IADLs and leisure activities utilizing her dominant right hand  Long Term Goal 1 Progress: Progressing toward goal Long Term Goal 2: Patient will increase right grip strength to >/= 25 pounds and pinch by 5 pounds for increased ability to open containers Long Term Goal 2 Progress: Progressing toward goal Long Term Goal 3: Patient will increase wrist AROM by 10 degrees for increased ability to comb hair and brush teeth with her right dominant hand. Long Term Goal 3 Progress: Met Long Term Goal 4: Patient will complete Nine Hole Peg score with R hand in under 23 seconds for increased ability to manipulate coins, buttons/zippers and tie shoes  Long Term Goal 4 Progress: Progressing toward goal Long Term Goal 5: Patient will demo ability to sign name legibly 3/3 trials   Problem List Patient Active Problem List   Diagnosis Date Noted  . History of hand surgery 09/14/2013  . De Quervain's syndrome (tenosynovitis)  09/02/2013  . Trigger finger, acquired 09/02/2013  . Distal radius fracture 08/03/2013  . Finger pain 08/03/2013  . Left wrist pain 08/03/2013  . Sprain of right middle finger 08/03/2013  . De Quervain's disease (radial styloid tenosynovitis)  11/01/2012  . Precordial pain 09/30/2012  . Mixed hyperlipidemia 09/30/2012  . Lower abdominal pain 07/06/2012  . Umbilical hernia [id number]  . Esophageal dysphagia 07/06/2012  . Essential hypertension, benign 01/01/2010  . COPD 01/01/2010  . GERD 01/01/2010    End of Session Activity Tolerance: Patient tolerated treatment well General Behavior During Therapy: [facility] for tasks assessed/performed  GO     11/06/2013, 8:12 PM

## 2013-11-21 NOTE — Progress Notes (Signed)
Occupational Therapy Treatment Patient Details  Name: ANALIESE DEMARTINI MRN: [id number] Date of Birth: May 22, 1934  Today's Date: 11/10/2013 Time: [id number] OT Time Calculation (min): 48 min Paraffin 1430-1445 (15') Ultrasound 1445-1457 (12') TherExercises 1457-1518 (21')   Visit#: 12 of 24  Re-eval: 11/20/13    Authorization: [employer]   Authorization Time Period: 1  Authorization Visit#: 12 of 17  Subjective Symptoms/Limitations Symptoms: S:  It feels so good when it is warm that is how I start every day Pain Assessment Currently in Pain?: Yes Pain Score: 2  Pain Location: Hand (distal wrist /palm) Pain Orientation: Right Pain Type: Acute pain  Precautions/Restrictions     Exercise/Treatments Wrist Exercises Forearm Supination: AROM;10 reps;Bar weights/barbell Bar Weights/Barbell (Forearm Supination): 1 lb Forearm Pronation: AROM;10 reps;Bar weights/barbell Bar Weights/Barbell (Forearm Pronation): 1 lb Wrist Flexion: AROM;10 reps;Bar weights/barbell Bar Weights/Barbell (Wrist Flexion): 1 lb Wrist Extension: AROM;10 reps;Bar weights/barbell Bar Weights/Barbell (Wrist Extension): 1 lb Wrist Radial Deviation: AROM;10 reps Wrist Ulnar Deviation: AROM;10 reps  Hand Exercises MCPJ Flexion: AAROM;15 reps MCPJ Extension: PROM;AAROM;15 reps PIPJ Flexion: AAROM;15 reps PIPJ Extension: PROM;AAROM DIPJ Flexion: AAROM;15 reps DIPJ Extension: PROM;AAROM;15 reps Theraputty: Flatten;Roll;Grip;Pinch Theraputty - Flatten: yellow Theraputty - Roll: yellow Theraputty - Grip: yellow Theraputty - Pinch: yellow     Modalities Modalities: Ultrasound;Moist Heat Manual Therapy Moist Heat Therapy Number Minutes Moist Heat: 12 Minutes (in conjunction with paraffin) Moist Heat Location: Hand;Wrist Ultrasound Ultrasound Location: palmar surface, lateral radial head/wrist Ultrasound Parameters: 105mins each site; 0.6 w/cm2, 1Mhz, continuous Ultrasound Goals:  Edema;Pain Activities of Daily Living Activities of Daily Living: paraffin to Right hand x 15 min with MHP  for pain, scar management and softening of tissues.   Occupational Therapy Assessment and Plan OT Assessment and Plan Clinical Impression Statement: A:  initiated 1# weight with wrist exercises this date with increased complaint of fatigue at end of session.  improved tolerance with theraputty exercises this date.   OT Plan: P:  follow up with weighted wrist exercises. increase digit extension exercises   Goals Short Term Goals Short Term Goal 1: Patient will be educated on HEP  Short Term Goal 1 Progress: Progressing toward goal Short Term Goal 2: Patietn will increase right grip strength by 5 pounds and pinch by 1 pound to increase ability to complete ADL dressing tasks.  Short Term Goal 2 Progress: Met Short Term Goal 3: Patient will improve wrist AROM by 5 for increased ability to complete toileting hygiene Short Term Goal 3 Progress: Met Short Term Goal 4: Patient will improve composite finger flexion with right hand to >/= 75%to improve ability to grasp drink Short Term Goal 4 Progress: Met Short Term Goal 5: Patient will decrease 9 hole peg time by 2 seconds to increase ability to tie shoes.  Short Term Goal 5 Progress: Met Long Term Goals Long Term Goal 1: Patient will return to prior level of independence with all B/IADLs and leisure activities utilizing her dominant right hand  Long Term Goal 1 Progress: Progressing toward goal Long Term Goal 2: Patient will increase right grip strength to >/= 25 pounds and pinch by 5 pounds for increased ability to open containers Long Term Goal 2 Progress: Progressing toward goal Long Term Goal 3: Patient will increase wrist AROM by 10 degrees for increased ability to comb hair and brush teeth with her right dominant hand. Long Term Goal 3 Progress: Met Long Term Goal 4: Patient will complete Nine Hole Peg score with R hand in under 23  seconds for increased ability to manipulate coins, buttons/zippers and tie shoes  Long Term Goal 4 Progress: Progressing toward goal Long Term Goal 5: Patient will demo ability to sign name legibly 3/3 trials   Problem List Patient Active Problem List   Diagnosis Date Noted  . History of hand surgery 09/14/2013  . De Quervain's syndrome (tenosynovitis) 09/02/2013  . Trigger finger, acquired 09/02/2013  . Distal radius fracture 08/03/2013  . Finger pain 08/03/2013  . Left wrist pain 08/03/2013  . Sprain of right middle finger 08/03/2013  . De Quervain's disease (radial styloid tenosynovitis) 11/01/2012  . Precordial pain 09/30/2012  . Mixed hyperlipidemia 09/30/2012  . Lower abdominal pain 07/06/2012  . Umbilical hernia [id number]  . Esophageal dysphagia 07/06/2012  . Essential hypertension, benign 01/01/2010  . COPD 01/01/2010  . GERD 01/01/2010    End of Session Activity Tolerance: Patient tolerated treatment well General Behavior During Therapy: [facility] for tasks assessed/performed  GO    Donney Rankins, OTR/L  11/10/2013, 8:44 PM

## 2013-11-22 ENCOUNTER — Ambulatory Visit (HOSPITAL_COMMUNITY)
Admission: RE | Admit: 2013-11-22 | Discharge: 2013-11-22 | Disposition: A | Discharge status: Home / Self Care | Payer: Medicare Other | Source: Ambulatory Visit | Attending: Pulmonary Disease | Admitting: Pulmonary Disease

## 2013-11-22 NOTE — Progress Notes (Signed)
Occupational Therapy Treatment Patient Details  Name: Lauren [name] MRN: [id number] Date of Birth: [birth year]  Today's Date: 11/20/2013 Time: [id number] OT Time Calculation (min): 48 min Paraffin 1430-1445 (15') Ultrasound 1445-1457 (12') TherExercises [id number] (21')    Visit#: 14 of 24  Re-eval: 11/20/13    Authorization: [employer]   Authorization Time Period: Before 17th visit  Authorization Visit#: 14 of 17  Subjective Symptoms/Limitations Symptoms: S:  I believe this wrist has something to do wtih this finger not staying straight.  Pain Assessment Currently in Pain?: Yes Pain Score: 3  Pain Location: Hand (distal wrist/palm) Pain Orientation: Right Pain Type: Acute pain     Exercise/Treatments Wrist Exercises Wrist Radial Deviation: Strengthening;10 reps (with red putty) Wrist Ulnar Deviation: Strengthening;10 reps (wtih red putty)   Theraputty: Flatten;Roll (putty press x 35) Theraputty - Flatten: red Theraputty - Roll: red Theraputty - Grip: red Theraputty - Pinch: red     Hand Exercises Theraputty: Flatten;Roll (putty press x 35) Theraputty - Flatten: red Theraputty - Roll: red Theraputty - Grip: red Theraputty - Pinch: red Other Hand Exercises: digit extension, scissoring with digits extended on putty     Modalities Modalities: Ultrasound;Moist Heat Manual Therapy Manual Therapy: Myofascial release Myofascial Release: MFR and manual stretching to flexor and extensor forearm, wrist and hand to decrease pain and fascial restrictions for greated wrist and digit mobility Moist Heat Therapy Number Minutes Moist Heat: 12 Minutes (with paraffin) Moist Heat Location: Hand;Wrist Ultrasound Ultrasound Location: palmar surface, lateral radial head/wrist Ultrasound Parameters: 58mins each site; 0.6 w/cm2, 1Mhz, continuous Ultrasound Goals: Edema;Pain Activities of Daily Living Activities of Daily Living: paraffin to Right hand x 15 min with MHP  for  pain, scar management and softening of tissues.   Occupational Therapy Assessment and Plan OT Assessment and Plan Clinical Impression Statement: A:  patient continues with wrist pain, edema and hardness at incision.  Encouraged patient to call MD for appointment to follow up.  moderate difficulty with digit extension and intrensic movement in putty.  reports improving funcional use at home during kitchen tasks.   OT Plan: P:  Reassessment, strengthening   Goals Short Term Goals Short Term Goal 1: Patient will be educated on HEP  Short Term Goal 1 Progress: Progressing toward goal Short Term Goal 2: Patietn will increase right grip strength by 5 pounds and pinch by 1 pound to increase ability to complete ADL dressing tasks.  Short Term Goal 2 Progress: Met Short Term Goal 3: Patient will improve wrist AROM by 5 for increased ability to complete toileting hygiene Short Term Goal 3 Progress: Met Short Term Goal 4: Patient will improve composite finger flexion with right hand to >/= 75%to improve ability to grasp drink Short Term Goal 4 Progress: Met Short Term Goal 5: Patient will decrease 9 hole peg time by 2 seconds to increase ability to tie shoes.  Short Term Goal 5 Progress: Met Long Term Goals Long Term Goal 1: Patient will return to prior level of independence with all B/IADLs and leisure activities utilizing her dominant right hand  Long Term Goal 1 Progress: Progressing toward goal Long Term Goal 2: Patient will increase right grip strength to >/= 25 pounds and pinch by 5 pounds for increased ability to open containers Long Term Goal 2 Progress: Progressing toward goal Long Term Goal 3: Patient will increase wrist AROM by 10 degrees for increased ability to comb hair and brush teeth with her right dominant hand. Long Term Goal 3 Progress:  Met Long Term Goal 4: Patient will complete Nine Hole Peg score with R hand in under 23 seconds for increased ability to manipulate coins,  buttons/zippers and tie shoes  Long Term Goal 4 Progress: Progressing toward goal Long Term Goal 5: Patient will demo ability to sign name legibly 3/3 trials  Long Term Goal 5 Progress: Progressing toward goal  Problem List Patient Active Problem List   Diagnosis Date Noted  . History of hand surgery 09/14/2013  . De Quervain's syndrome (tenosynovitis) 09/02/2013  . Trigger finger, acquired 09/02/2013  . Distal radius fracture 08/03/2013  . Finger pain 08/03/2013  . Left wrist pain 08/03/2013  . Sprain of right middle finger 08/03/2013  . De Quervain's disease (radial styloid tenosynovitis) 11/01/2012  . Precordial pain 09/30/2012  . Mixed hyperlipidemia 09/30/2012  . Lower abdominal pain 07/06/2012  . Umbilical hernia [id number]  . Esophageal dysphagia 07/06/2012  . Essential hypertension, benign 01/01/2010  . COPD 01/01/2010  . GERD 01/01/2010    End of Session Activity Tolerance: Patient tolerated treatment well General Behavior During Therapy: [facility] for tasks assessed/performed  GO    Donney Rankins, OTR/L  11/20/2013, 4:53 PM

## 2013-11-22 NOTE — Progress Notes (Signed)
Occupational Therapy Treatment Patient Details  Name: LENORIA SLATTERY MRN: [id number] Date of Birth: 1934/08/01  Today's Date: 11/13/2013 Time: [id number] OT Time Calculation (min): 47 min Paraffin 1303-1318 (15') Ultrasound 1318-1330 (12') TherExercises 1330-1350 (20')  Visit#: 13 of 24  Re-eval: 11/20/13    Authorization: [employer]   Authorization Time Period: Before 17th visit  Authorization Visit#: 13 of 17  Subjective Symptoms/Limitations Symptoms: S: I can get this finger to go straight afer I have been working with it but the minute I close my hand it is gone.  Pain Assessment Currently in Pain?: Yes Pain Score: 3  Pain Location: Hand (distal wrist/palm) Pain Orientation: Right Pain Type: Acute pain     Exercise/Treatments Hand Exercises PIPJ Extension: PROM;AAROM (prolonged stretch with UE treatment ) Theraputty: Flatten;Roll (putty press x 25) Theraputty - Flatten: red Theraputty - Roll: red     Modalities Modalities: Ultrasound;Moist Heat Manual Therapy Manual Therapy: Myofascial release Myofascial Release: MFR and manual stretching to flexor and extensor forearm, wrist and hand to decrease pain and fascial restrictions for greated wrist and digit mobility Moist Heat Therapy Number Minutes Moist Heat: 12 Minutes (with paraffin) Moist Heat Location: Hand;Wrist Ultrasound Ultrasound Location: palmar surface, lateral radial head/wrist Ultrasound Parameters: 70mins each site; 0.6 w/cm2, 1Mhz, continuous Ultrasound Goals: Edema;Pain Activities of Daily Living Activities of Daily Living: paraffin to Right hand x 15 min with MHP  for pain, scar management and softening of tissues.   Occupational Therapy Assessment and Plan OT Assessment and Plan Clinical Impression Statement: A:  Improving ROM in 3rd digit.  Continue with wrist edema and pain with mobillity.  Patient with improving strength.  initiated red putty this date with fair tolerance.  OT Plan: P:   Digit extension exercises, putty for HEP   Goals Short Term Goals Short Term Goal 1: Patient will be educated on HEP  Short Term Goal 1 Progress: Progressing toward goal Short Term Goal 2: Patietn will increase right grip strength by 5 pounds and pinch by 1 pound to increase ability to complete ADL dressing tasks.  Short Term Goal 2 Progress: Met Short Term Goal 3: Patient will improve wrist AROM by 5 for increased ability to complete toileting hygiene Short Term Goal 3 Progress: Met Short Term Goal 4: Patient will improve composite finger flexion with right hand to >/= 75%to improve ability to grasp drink Short Term Goal 4 Progress: Met Short Term Goal 5: Patient will decrease 9 hole peg time by 2 seconds to increase ability to tie shoes.  Short Term Goal 5 Progress: Met Long Term Goals Long Term Goal 1: Patient will return to prior level of independence with all B/IADLs and leisure activities utilizing her dominant right hand  Long Term Goal 1 Progress: Progressing toward goal Long Term Goal 2: Patient will increase right grip strength to >/= 25 pounds and pinch by 5 pounds for increased ability to open containers Long Term Goal 2 Progress: Progressing toward goal Long Term Goal 3: Patient will increase wrist AROM by 10 degrees for increased ability to comb hair and brush teeth with her right dominant hand. Long Term Goal 3 Progress: Met Long Term Goal 4: Patient will complete Nine Hole Peg score with R hand in under 23 seconds for increased ability to manipulate coins, buttons/zippers and tie shoes  Long Term Goal 4 Progress: Progressing toward goal Long Term Goal 5: Patient will demo ability to sign name legibly 3/3 trials  Long Term Goal 5 Progress: Progressing toward  goal  Problem List Patient Active Problem List   Diagnosis Date Noted  . History of hand surgery 09/14/2013  . De Quervain's syndrome (tenosynovitis) 09/02/2013  . Trigger finger, acquired 09/02/2013  . Distal  radius fracture 08/03/2013  . Finger pain 08/03/2013  . Left wrist pain 08/03/2013  . Sprain of right middle finger 08/03/2013  . De Quervain's disease (radial styloid tenosynovitis) 11/01/2012  . Precordial pain 09/30/2012  . Mixed hyperlipidemia 09/30/2012  . Lower abdominal pain 07/06/2012  . Umbilical hernia [id number]  . Esophageal dysphagia 07/06/2012  . Essential hypertension, benign 01/01/2010  . COPD 01/01/2010  . GERD 01/01/2010    End of Session Activity Tolerance: Patient tolerated treatment well General Behavior During Therapy: [facility] for tasks assessed/performed  GO     11/13/2013, 2:32 PM

## 2013-11-22 NOTE — Progress Notes (Signed)
Occupational Therapy Treatment Patient Details  Name: Lauren [name] MRN: [id number] Date of Birth: 1934-01-22  Today's Date: 11/22/2013 Time: [id number] OT Time Calculation (min): 48 min Paraffin [id number] (15') Ultrasound [id number] (12') Manual 1304-1325 (21')  Visit#: 15 of 24  Re-eval: 11/20/13    Authorization: [employer]   Authorization Time Period: Before 17th visit  Authorization Visit#: 15 of 17  Subjective Symptoms/Limitations Symptoms: S:  It was a little more than a 4 this morning... I go back to the doctor on the 8th  Pain Assessment Currently in Pain?: No/denies Pain Score: 4    Exercise/Treatments    Modalities Modalities: Ultrasound;Moist Heat Manual Therapy Manual Therapy: Myofascial release Myofascial Release: MFR and manual stretching to flexor and extensor forearm, wrist and hand to decrease pain and fascial restrictions for greated wrist and digit mobility Moist Heat Therapy Moist Heat Location: Hand;Wrist Ultrasound Ultrasound Location: palmar surface, lateral radial head/wrist Ultrasound Parameters: 86mins each site; 0.6 w/cm2, 1Mhz, continuous Ultrasound Goals: Edema;Pain Activities of Daily Living Activities of Daily Living: paraffin to Right hand x 15 min with MHP  for pain, scar management and softening of tissues.   Occupational Therapy Assessment and Plan OT Assessment and Plan Clinical Impression Statement: A: Patient with increased pain and discomfort this date. she reports increased edema last two days.  Aggressive stretching to 3rd digit and friction to wrist area with fair tolerance.  patient reports improved feeling following treatment this date.   Patient reports good follow through with HEP at home and cont to report significant pain in wrist area.  Patient to MD Jan 8.   OT Plan: P:  Reassessment, strengthening   Goals Short Term Goals Short Term Goal 1: Patient will be educated on HEP  Short Term Goal 1 Progress: Progressing  toward goal Short Term Goal 2: Patietn will increase right grip strength by 5 pounds and pinch by 1 pound to increase ability to complete ADL dressing tasks.  Short Term Goal 2 Progress: Met Short Term Goal 3: Patient will improve wrist AROM by 5 for increased ability to complete toileting hygiene Short Term Goal 3 Progress: Met Short Term Goal 4: Patient will improve composite finger flexion with right hand to >/= 75%to improve ability to grasp drink Short Term Goal 4 Progress: Met Short Term Goal 5: Patient will decrease 9 hole peg time by 2 seconds to increase ability to tie shoes.  Short Term Goal 5 Progress: Met Long Term Goals Long Term Goal 1: Patient will return to prior level of independence with all B/IADLs and leisure activities utilizing her dominant right hand  Long Term Goal 1 Progress: Progressing toward goal Long Term Goal 2: Patient will increase right grip strength to >/= 25 pounds and pinch by 5 pounds for increased ability to open containers Long Term Goal 2 Progress: Progressing toward goal Long Term Goal 3: Patient will increase wrist AROM by 10 degrees for increased ability to comb hair and brush teeth with her right dominant hand. Long Term Goal 3 Progress: Met Long Term Goal 4: Patient will complete Nine Hole Peg score with R hand in under 23 seconds for increased ability to manipulate coins, buttons/zippers and tie shoes  Long Term Goal 4 Progress: Progressing toward goal Long Term Goal 5: Patient will demo ability to sign name legibly 3/3 trials  Long Term Goal 5 Progress: Progressing toward goal  Problem List Patient Active Problem List   Diagnosis Date Noted  . History of hand surgery 09/14/2013  .  De Quervain's syndrome (tenosynovitis) 09/02/2013  . Trigger finger, acquired 09/02/2013  . Distal radius fracture 08/03/2013  . Finger pain 08/03/2013  . Left wrist pain 08/03/2013  . Sprain of right middle finger 08/03/2013  . De Quervain's disease (radial  styloid tenosynovitis) 11/01/2012  . Precordial pain 09/30/2012  . Mixed hyperlipidemia 09/30/2012  . Lower abdominal pain 07/06/2012  . Umbilical hernia [id number]  . Esophageal dysphagia 07/06/2012  . Essential hypertension, benign 01/01/2010  . COPD 01/01/2010  . GERD 01/01/2010    End of Session Activity Tolerance: Patient tolerated treatment well General Behavior During Therapy: [facility] for tasks assessed/performed  GO    Donney Rankins, OTR/L  11/22/2013, 3:08 PM

## 2013-11-28 ENCOUNTER — Ambulatory Visit (HOSPITAL_COMMUNITY)
Admission: RE | Admit: 2013-11-28 | Discharge: 2013-11-28 | Disposition: A | Discharge status: Home / Self Care | Payer: Medicare Other | Source: Ambulatory Visit | Attending: Pulmonary Disease | Admitting: Pulmonary Disease

## 2013-11-28 DIAGNOSIS — M25649 Stiffness of unspecified hand, not elsewhere classified: Secondary | ICD-10-CM | POA: Insufficient documentation

## 2013-11-28 DIAGNOSIS — R279 Unspecified lack of coordination: Secondary | ICD-10-CM | POA: Diagnosis not present

## 2013-11-28 DIAGNOSIS — M79609 Pain in unspecified limb: Secondary | ICD-10-CM | POA: Insufficient documentation

## 2013-11-28 DIAGNOSIS — M6281 Muscle weakness (generalized): Secondary | ICD-10-CM | POA: Diagnosis not present

## 2013-11-28 DIAGNOSIS — M25539 Pain in unspecified wrist: Secondary | ICD-10-CM | POA: Insufficient documentation

## 2013-11-28 DIAGNOSIS — I10 Essential (primary) hypertension: Secondary | ICD-10-CM | POA: Diagnosis not present

## 2013-11-28 DIAGNOSIS — IMO0001 Reserved for inherently not codable concepts without codable children: Secondary | ICD-10-CM | POA: Insufficient documentation

## 2013-11-28 DIAGNOSIS — J4489 Other specified chronic obstructive pulmonary disease: Secondary | ICD-10-CM | POA: Insufficient documentation

## 2013-11-28 DIAGNOSIS — J449 Chronic obstructive pulmonary disease, unspecified: Secondary | ICD-10-CM | POA: Insufficient documentation

## 2013-11-28 NOTE — Evaluation (Addendum)
Occupational Therapy Re-Evaluation  Patient Details  Name: Lauren [name] MRN: [id number] Date of Birth: September 20, 1934  Today's Date: 11/28/2013 Time: 2641-5830 OT Time Calculation (min): 58 min Reassessment  1045-1100 (15') Paraffin 1100-1115 (15') Manual 1115-1128 (13') TherExercises 9407-6808 (15')  Visit#: 16 of 24  Re-eval: 12/18/13  Assessment Diagnosis: Right 3rd digit trigger finger release and de Quervain's release Next MD Visit: 11/30/2013  Authorization: [facility] Medicare   Authorization Time Period: Before 26th visit   Authorization Visit#: 15 of 44   Past Medical History:  Past Medical History  Diagnosis Date  . Asthma   . Osteoarthritis   . Essential hypertension, benign   . Hyperlipidemia   . Back pain   . GERD (gastroesophageal reflux disease)   . History of cardiac catheterization     Reportedly normal coronaries in 2000  . COPD (chronic obstructive pulmonary disease)     O2 at night sometimes   Past Surgical History:  Past Surgical History  Procedure Laterality Date  . Shoulder surgery      Rotator Cuff  . Ankle surgery      Rod and 13 screws  . Rotator cuff repair    . Appendectomy    . Breast biopsy  1996    Benign  . Abdominal hysterectomy  1994  . Colonoscopy  01/27/2011    Normal rectum/Sigmoid diverticula/Multiple colonic polyps removed (TUBULAR ADENOMA) Next TCS 01/2016  . Esophagogastroduodenoscopy  08/21/2003       Normal tubular esophagus status post passage of Maloney dilator as  described above/  2. Multiple linear antral erosions of uncertain significance, could be NSAID  . Esophagogastroduodenoscopy  08/04/2012    Procedure: ESOPHAGOGASTRODUODENOSCOPY (EGD);  Surgeon: Daneil Dolin, MD;  [facility];  Service: Endoscopy;  Laterality: N/A;  7:30  . Esophageal dilation  08/04/2012    Procedure: ESOPHAGEAL DILATION;  Surgeon: Daneil Dolin, MD;  [facility];  Service: Endoscopy;;  . Dorsal compartment release Left  02/03/2013    Procedure: RELEASE DORSAL COMPARTMENT (DEQUERVAIN);  Surgeon: Carole Civil, MD;  Location: [facility];  Service: Orthopedics;  Laterality: Left;  . Trigger finger release Right 09/11/2013    Procedure: RIGHT LONG FINGER RELEASE TRIGGER FINGER;  Surgeon: Carole Civil, MD;  Location: [facility];  Service: Orthopedics;  Laterality: Right;  . Dorsal compartment release Right 09/11/2013    Procedure: RELEASE DORSAL COMPARTMENT (DEQUERVAIN);  Surgeon: Carole Civil, MD;  Location: [facility];  Service: Orthopedics;  Laterality: Right;    Subjective Symptoms/Limitations Symptoms: S:   It isnt too bad to day but my wrist is just still so sore.  Pain Assessment Currently in Pain?: Yes Pain Score: 2  Pain Location: Hand (distal wrist/palm ) Pain Orientation: Right Pain Type: Acute pain Multiple Pain Sites: No     Balance Screening Balance Screen Has the patient fallen in the past 6 months: No  Prior [place] expects to be discharged to:: Private residence  Assessment ADL/Vision/Perception ADL ADL Comments: Patient reports continued difficulty lifting objects with any weight wtih her right UE  Dominant Hand: Right Vision - History Baseline Vision: Wears glasses all the time  Cognition/Observation Cognition Overall Cognitive Status: Within Functional Limits for tasks assessed Observation/Other Assessments Observations: well healed surgical incisions to palmar surface and at radial head of right extremity   Sensation/Coordination/Edema Sensation Light Touch: Appears Intact Coordination Gross Motor Movements are Fluid and Coordinated: Yes Fine Motor Movements are Fluid and Coordinated: Yes  9 Hole Peg Test: R:22.90 L: 22.51 (R: 23.43;  L: 24.50) Edema Edema: R wrist: 17.0: MP: 20.1; L PIP 7.2 L DIP 5.4 (R: wrist 18.2; MP 21.3; T MP8.8; T IP 7.0; I PIP 7.0; I DIP )  Additional Assessments RUE Assessment RUE Assessment: Exceptions  to [facility] RUE AROM (degrees) RUE Overall AROM Comments: 1-40/74; 2-60/100/44; 3-70/-30,92/46; 4-74/102/46; 5-78/94/80 (flexion: 1-44/76 (32/50); 2- 54/92/44 (50/82/28); 3- 72/90/4) Right Wrist Extension: 56 Degrees (58) Right Wrist Flexion: 46 Degrees (26) Right Composite Finger Flexion:  (90% (75%)) Right Thumb Opposition: Digit 2;Digit 3;Digit 4;Digit 5 RUE Strength Right Wrist Flexion: 3+/5 Right Wrist Extension: 3+/5 Grip (lbs): 25 (18) Lateral Pinch: 17 lbs (9) 3 Point Pinch: 11 lbs (5) LUE Strength Grip (lbs): 40 (43) Lateral Pinch: 20 lbs (14) 3 Point Pinch: 14 lbs (6) Palpation Palpation: min-mod fascial restrictions palmar surface of hand; min base of CMC  Right Hand Strength - Pinch (lbs) Lateral Pinch: 17 lbs (9) 3 Point Pinch: 11 lbs (5) Left Hand Strength - Pinch (lbs) Lateral Pinch: 20 lbs (14) 3 Point Pinch: 14 lbs (6)   Exercise/Treatments Wrist Exercises Wrist Flexion: Strengthening;10 reps (red putty ) Wrist Extension: Strengthening;10 reps   Theraputty: Flatten;Grip;Pinch (digit extension and isometirc strengtheing )        Modalities Modalities: Moist Heat Manual Therapy Manual Therapy: Myofascial release Myofascial Release:  MFR and manual stretching to flexor and extensor forearm, wrist and hand to decrease pain and fascial restrictions for greated wrist and digit mobility Moist Heat Therapy Number Minutes Moist Heat:  (with paraffin ) Moist Heat Location: Hand;Wrist Activities of Daily Living Activities of Daily Living: paraffin to Right hand x 15 min with MHP  for pain, scar management and softening of tissues.   Occupational Therapy Assessment and Plan OT Assessment and Plan Clinical Impression Statement: A:  Reassessment completed this date.  Patient demos gains in strength, range, functional use and coordination.  She continues with increased hard area on her wrist with improved however still swollen, well healed incision site.  Her pain in  limiting her range and ability to independently utilize her dominant RUE.  Patient met 5/5 STG, 3/5 LTG.  Added 3 sdditional goals for patient to work toward.   Issued HEP and red putty for home program.  Recommend patient would benefit from continued skilled therapy to maximize functional independence  OT Plan: P:  strengthening, coordination tasks/activities.   Goals Short Term Goals Short Term Goal 1: Patient will be educated on HEP  Short Term Goal 1 Progress: Met Short Term Goal 2: Patietn will increase right grip strength by 5 pounds and pinch by 1 pound to increase ability to complete ADL dressing tasks.  Short Term Goal 2 Progress: Met Short Term Goal 3: Patient will improve wrist AROM by 5 for increased ability to complete toileting hygiene Short Term Goal 3 Progress: Met Short Term Goal 4: Patient will improve composite finger flexion with right hand to >/= 75%to improve ability to grasp drink Short Term Goal 4 Progress: Met Short Term Goal 5: Patient will decrease 9 hole peg time by 2 seconds to increase ability to tie shoes.  Short Term Goal 5 Progress: Met Long Term Goals Long Term Goal 1: Patient will return to prior level of independence with all B/IADLs and leisure activities utilizing her dominant right hand  Long Term Goal 1 Progress: Progressing toward goal Long Term Goal 2: Patient will increase right grip strength to >/= 25 pounds and pinch by  5 pounds for increased ability to open containers Long Term Goal 2 Progress: Met Long Term Goal 3: Patient will increase wrist AROM by 10 degrees for increased ability to comb hair and brush teeth with her right dominant hand. Long Term Goal 3 Progress: Met Long Term Goal 4: Patient will complete Nine Hole Peg score with R hand in under 23 seconds for increased ability to manipulate coins, buttons/zippers and tie shoes  Long Term Goal 4 Progress: Met Long Term Goal 5: Patient will demo ability to sign name legibly 3/3 trials  Long  Term Goal 5 Progress: Progressing toward goal Additional Long Term Goals?: Yes Long Term Goal 6: Patient will demo grip strength >/= 30# with complaint of pain >/=1/10 Long Term Goal 7: Patient will report ability to open water bottles 3/5 trials independently Long Term Goal 8: Patient will complete 9 hole peg test in > 22 seconds with RUE  Problem List Patient Active Problem List   Diagnosis Date Noted  . History of hand surgery 09/14/2013  . De Quervain's syndrome (tenosynovitis) 09/02/2013  . Trigger finger, acquired 09/02/2013  . Distal radius fracture 08/03/2013  . Finger pain 08/03/2013  . Left wrist pain 08/03/2013  . Sprain of right middle finger 08/03/2013  . De Quervain's disease (radial styloid tenosynovitis) 11/01/2012  . Precordial pain 09/30/2012  . Mixed hyperlipidemia 09/30/2012  . Lower abdominal pain 07/06/2012  . Umbilical hernia 29/51/8841  . Esophageal dysphagia 07/06/2012  . Essential hypertension, benign 01/01/2010  . COPD 01/01/2010  . GERD 01/01/2010    End of Session Activity Tolerance: Patient tolerated treatment well General Behavior During Therapy: [facility] for tasks assessed/performed  GO Functional Assessment Tool Used: 9 hole peg score R: 22.90 (23.43 reassess, 26.72 eval);   Grip R: 25# (18# reassess; 6# eval) Functional Limitation: Carrying, moving and handling objects Carrying, Moving and Handling Objects Current Status (Y6063): At least 20 percent but less than 40 percent impaired, limited or restricted Carrying, Moving and Handling Objects Goal Status [phone]): At least 1 percent but less than 20 percent impaired, limited or restricted  Donney Rankins, OTR/L  11/28/2013, 12:40 PM  Physician Documentation Your signature is required to indicate approval of the treatment plan as stated above.  Please sign and either send electronically or make a copy of this report for your files and return this physician signed original.  Please mark one  1.__approve of plan  2. ___approve of plan with the following conditions.   ______________________________                                                          _____________________ Physician Signature                                                                                                             Date

## 2013-11-30 ENCOUNTER — Ambulatory Visit (INDEPENDENT_AMBULATORY_CARE_PROVIDER_SITE_OTHER): Payer: Self-pay | Admitting: Orthopedic Surgery

## 2013-11-30 ENCOUNTER — Encounter: Payer: Self-pay | Admitting: Orthopedic Surgery

## 2013-11-30 VITALS — BP 153/80 | Ht 63.0 in | Wt 181.0 lb

## 2013-11-30 DIAGNOSIS — R1314 Dysphagia, pharyngoesophageal phase: Secondary | ICD-10-CM

## 2013-11-30 DIAGNOSIS — R1319 Other dysphagia: Secondary | ICD-10-CM

## 2013-11-30 DIAGNOSIS — R1310 Dysphagia, unspecified: Secondary | ICD-10-CM

## 2013-11-30 NOTE — Progress Notes (Signed)
Patient ID: Lauren [name], female   DOB: 24-Jul-1934, 80 y.o.   MRN: [id number] Chief Complaint  Patient presents with  . Wrist Pain    Right wrist pain s/p DeQuervains   OT Assessment and Plan Clinical Impression Statement: A:  Reassessment completed this date.  Patient demos gains in strength, range, functional use and coordination.  She continues with increased hard area on her wrist with improved however still swollen, well healed incision site.  Her pain in limiting her range and ability to independently utilize her dominant RUE.  Patient met 5/5 STG, 3/5 LTG.  Added 3 sdditional goals for patient to work toward.   Issued HEP and red putty for home program.  Recommend patient would benefit from continued skilled therapy to maximize functional independence   OT Plan: P:  strengthening, coordination tasks/activities.    right long  finger and right Dequervains, remove sutures DOS 09/11/13        The patient is having some swelling over the surgical site and still has stiffness of the IP joint of the right long finger but the triggering has resolved  Recommend rest for 2 weeks return in 2 weeks   reapplied thumb spica splint

## 2013-12-01 ENCOUNTER — Inpatient Hospital Stay (HOSPITAL_COMMUNITY): Admission: RE | Admit: 2013-12-01 | Payer: Medicare Other | Source: Ambulatory Visit | Admitting: Occupational Therapy

## 2013-12-05 ENCOUNTER — Ambulatory Visit (HOSPITAL_COMMUNITY): Payer: Medicare Other | Admitting: Specialist

## 2013-12-07 ENCOUNTER — Ambulatory Visit (HOSPITAL_COMMUNITY): Payer: Medicare Other | Admitting: Occupational Therapy

## 2013-12-12 ENCOUNTER — Ambulatory Visit (HOSPITAL_COMMUNITY): Payer: Medicare Other | Admitting: Occupational Therapy

## 2013-12-14 ENCOUNTER — Telehealth: Payer: Self-pay | Admitting: Orthopedic Surgery

## 2013-12-14 ENCOUNTER — Encounter: Payer: Self-pay | Admitting: Orthopedic Surgery

## 2013-12-14 ENCOUNTER — Inpatient Hospital Stay (HOSPITAL_COMMUNITY): Admission: RE | Admit: 2013-12-14 | Payer: Medicare Other | Source: Ambulatory Visit | Admitting: Specialist

## 2013-12-14 ENCOUNTER — Ambulatory Visit (INDEPENDENT_AMBULATORY_CARE_PROVIDER_SITE_OTHER): Payer: Self-pay | Admitting: Orthopedic Surgery

## 2013-12-14 VITALS — BP 156/73 | Ht 63.0 in | Wt 181.0 lb

## 2013-12-14 DIAGNOSIS — M654 Radial styloid tenosynovitis [de Quervain]: Secondary | ICD-10-CM

## 2013-12-14 DIAGNOSIS — M6530 Trigger finger, unspecified finger: Secondary | ICD-10-CM

## 2013-12-14 NOTE — Progress Notes (Signed)
Patient ID: Lauren [name], female   DOB: April 26, 1934, 80 y.o.   MRN: [id number] Chief Complaint  Patient presents with  . Follow-up    2 week recheck right wrist s/p DeQuervains and brace DOS 09/11/13    90 day global period followup evaluation  Status post de Quervain's release right wrist and long finger trigger release doing well improved in splint. We can remove the splint. Note the patient has some tenderness over the A1 pulley but no clicking or locking. 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  This is chronic and will be addressed with any surgical intervention  Patient is advised to return to normal activity followup with [place] as needed she can remove the splint

## 2013-12-14 NOTE — Telephone Encounter (Signed)
Advised patient that Dr. Aline Brochure said she did not need anymore therapy.

## 2013-12-14 NOTE — Patient Instructions (Addendum)
Remove splint Activities as tolerated

## 2013-12-14 NOTE — Telephone Encounter (Signed)
Patient called back after her visit today, 12/13/13; states received call from physical therapist at [facility], asking her what she was advised to do about therapy per appointment with Dr Aline Brochure;  Faythe Ghee to discontinue therapy? If so, what does Forestine Na need, if anything?  Patient ph# is [phone] (cell) and [phone] (home)

## 2013-12-19 ENCOUNTER — Ambulatory Visit (HOSPITAL_COMMUNITY): Payer: Medicare Other | Admitting: Specialist

## 2013-12-21 ENCOUNTER — Ambulatory Visit (HOSPITAL_COMMUNITY): Payer: Medicare Other | Admitting: Occupational Therapy

## 2014-01-09 DIAGNOSIS — E018 Other iodine-deficiency related thyroid disorders and allied conditions: Secondary | ICD-10-CM | POA: Diagnosis not present

## 2014-01-09 DIAGNOSIS — I10 Essential (primary) hypertension: Secondary | ICD-10-CM | POA: Diagnosis not present

## 2014-01-22 DIAGNOSIS — I10 Essential (primary) hypertension: Secondary | ICD-10-CM | POA: Diagnosis not present

## 2014-01-22 DIAGNOSIS — E018 Other iodine-deficiency related thyroid disorders and allied conditions: Secondary | ICD-10-CM | POA: Diagnosis not present

## 2014-01-22 DIAGNOSIS — E785 Hyperlipidemia, unspecified: Secondary | ICD-10-CM | POA: Diagnosis not present

## 2014-01-22 DIAGNOSIS — J449 Chronic obstructive pulmonary disease, unspecified: Secondary | ICD-10-CM | POA: Diagnosis not present

## 2014-01-23 ENCOUNTER — Encounter: Payer: Self-pay | Admitting: Orthopedic Surgery

## 2014-01-23 ENCOUNTER — Ambulatory Visit: Payer: Medicare Other | Admitting: Orthopedic Surgery

## 2014-01-23 ENCOUNTER — Encounter: Payer: Self-pay | Admitting: Pulmonary Disease

## 2014-01-23 DIAGNOSIS — I259 Chronic ischemic heart disease, unspecified: Secondary | ICD-10-CM | POA: Diagnosis not present

## 2014-01-23 DIAGNOSIS — J449 Chronic obstructive pulmonary disease, unspecified: Secondary | ICD-10-CM | POA: Diagnosis not present

## 2014-01-23 DIAGNOSIS — K2100 Gastro-esophageal reflux disease with esophagitis, without bleeding: Secondary | ICD-10-CM | POA: Diagnosis not present

## 2014-01-23 DIAGNOSIS — I119 Hypertensive heart disease without heart failure: Secondary | ICD-10-CM | POA: Diagnosis not present

## 2014-02-22 ENCOUNTER — Ambulatory Visit (INDEPENDENT_AMBULATORY_CARE_PROVIDER_SITE_OTHER): Payer: Medicare Other | Admitting: Orthopedic Surgery

## 2014-02-22 ENCOUNTER — Encounter: Payer: Self-pay | Admitting: Orthopedic Surgery

## 2014-02-22 VITALS — BP 170/85 | Ht 63.0 in | Wt 181.0 lb

## 2014-02-22 DIAGNOSIS — M654 Radial styloid tenosynovitis [de Quervain]: Secondary | ICD-10-CM

## 2014-02-22 DIAGNOSIS — M67919 Unspecified disorder of synovium and tendon, unspecified shoulder: Secondary | ICD-10-CM

## 2014-02-22 DIAGNOSIS — M719 Bursopathy, unspecified: Secondary | ICD-10-CM

## 2014-02-22 NOTE — Progress Notes (Signed)
Patient ID: Lauren [name], female   DOB: October 09, 1934, 80 y.o.   MRN: [id number]  Chief Complaint  Patient presents with  . Follow-up    Patient wants right wrist rechecked    History this is a 80 year old female who had a de Quervain's release of the right wrist complains of swelling over the wrist and pain in her right shoulder with some radiation to the right elbow. She complains that she can't sleep on her right side for several months. No history of trauma.  System review no numbness or tingling  BP 170/85  Ht 5\' 3"  (1.6 m)  Wt 181 lb (82.101 kg)  BMI 32.07 kg/m2 General appearance is normal, the patient is alert and oriented x3 with normal mood and affect. Ambulation continues to be normal. Skin over the right shoulder and cervical spine right normal No motor function loss in the right upper extremity Shoulder elbow wrist and hand stable Pulses are intact on the radial and ulnar artery there is no lymphadenopathy Painful foward elevation of the right shoulder with a positive impingement sign and a negative drop test negative Tinel's over the right ulnar nerve, tenderness and swelling over the right wrist first extensor compartment Finkelstein's test is mildly positive  No imaging was done today  Our preliminary diagnosis is   Encounter Diagnoses  Name Primary?  Tennis Must Quervain's disease (radial styloid tenosynovitis) Yes  . Disorders of bursae and tendons in shoulder region, unspecified     Plan subacromial injection right shoulder continue bio freeze and splinting right wrist followup in 6 weeks   Procedure inject subacromial space right shoulder Diagnosis rotator cuff syndrome right shoulder Medication Depo-Medrol 40 mg, 1 cc and lidocaine 1% 3 cc Verbal consent Timeout completed  The injection site was cleaned with alcohol and sprayed with ethyl chloride. From a posterior approach a 20-gauge needle was injected in the subacromial space. The medication went in easily.  There were no complications. The wound was covered with a sterile bandage. Appropriate precautions were given.

## 2014-02-22 NOTE — Patient Instructions (Addendum)
You have received a steroid shot. 15% of patients experience increased pain at the injection site with in the next 24 hours. This is best treated with ice and tylenol extra strength 2 tabs every 8 hours. If you are still having pain please call the office.   Apply max freeze to wrist 3 x a day wear splint as needed  Encounter Diagnoses  Name Primary?  Tennis Must Quervain's disease (radial styloid tenosynovitis) Yes  . Disorders of bursae and tendons in shoulder region, unspecified

## 2014-03-23 DIAGNOSIS — H52229 Regular astigmatism, unspecified eye: Secondary | ICD-10-CM | POA: Diagnosis not present

## 2014-03-23 DIAGNOSIS — H43819 Vitreous degeneration, unspecified eye: Secondary | ICD-10-CM | POA: Diagnosis not present

## 2014-03-23 DIAGNOSIS — H5231 Anisometropia: Secondary | ICD-10-CM | POA: Diagnosis not present

## 2014-03-23 DIAGNOSIS — H04129 Dry eye syndrome of unspecified lacrimal gland: Secondary | ICD-10-CM | POA: Diagnosis not present

## 2014-03-26 DIAGNOSIS — N6019 Diffuse cystic mastopathy of unspecified breast: Secondary | ICD-10-CM | POA: Diagnosis not present

## 2014-03-26 DIAGNOSIS — Z1239 Encounter for other screening for malignant neoplasm of breast: Secondary | ICD-10-CM | POA: Diagnosis not present

## 2014-04-05 ENCOUNTER — Ambulatory Visit (INDEPENDENT_AMBULATORY_CARE_PROVIDER_SITE_OTHER): Payer: Medicare Other | Admitting: Orthopedic Surgery

## 2014-04-05 VITALS — BP 122/74 | Ht 63.0 in | Wt 181.0 lb

## 2014-04-05 DIAGNOSIS — M1710 Unilateral primary osteoarthritis, unspecified knee: Secondary | ICD-10-CM | POA: Diagnosis not present

## 2014-04-05 DIAGNOSIS — IMO0002 Reserved for concepts with insufficient information to code with codable children: Secondary | ICD-10-CM

## 2014-04-05 DIAGNOSIS — M1712 Unilateral primary osteoarthritis, left knee: Secondary | ICD-10-CM

## 2014-04-05 MED ORDER — DICLOFENAC SODIUM 1 % TD GEL: 4.0000 g | Freq: Four times a day (QID) | TRANSDERMAL | Status: DC

## 2014-04-05 NOTE — Patient Instructions (Signed)
Do home exercises

## 2014-04-05 NOTE — Progress Notes (Signed)
Patient ID: Lauren [name], female   DOB: 1934-11-18, 80 y.o.   MRN: [id number] Recheck right shoulder status post injection, impingement syndrome. Recheck right wrist chronic de Quervain's tenosynovitis status post de Quervain's release  Both shoulder and wrist have improved mild discomfort right shoulder with certain activities which include overhead reaching.  Today complains of burning pain in the left knee at night which may last for 10 minutes then resolved no catching locking occasional giving way. She is ambulating with a cane.  System review as stated new pain in the left knee  Examination shows passive forward elevation of 150 adequate strength rotator cuff mild impingement sign. Wrist swelling has decreased wrist range of motion has returned to normal and her de Quervain's test is normal  Left knee mild tenderness over the front of the knee joint line medial.  Impression resolved impingement syndrome, resolved de Quervain's tenosynovitis. Anterior knee pain, osteoarthritis.  Recommend Voltaren gel  Followup as needed

## 2014-04-25 DIAGNOSIS — M1990 Unspecified osteoarthritis, unspecified site: Secondary | ICD-10-CM | POA: Diagnosis not present

## 2014-04-25 DIAGNOSIS — E039 Hypothyroidism, unspecified: Secondary | ICD-10-CM | POA: Diagnosis not present

## 2014-04-25 DIAGNOSIS — I10 Essential (primary) hypertension: Secondary | ICD-10-CM | POA: Diagnosis not present

## 2014-04-25 DIAGNOSIS — J449 Chronic obstructive pulmonary disease, unspecified: Secondary | ICD-10-CM | POA: Diagnosis not present

## 2014-05-17 DIAGNOSIS — E018 Other iodine-deficiency related thyroid disorders and allied conditions: Secondary | ICD-10-CM | POA: Diagnosis not present

## 2014-05-24 DIAGNOSIS — E018 Other iodine-deficiency related thyroid disorders and allied conditions: Secondary | ICD-10-CM | POA: Diagnosis not present

## 2014-05-28 DIAGNOSIS — I10 Essential (primary) hypertension: Secondary | ICD-10-CM | POA: Diagnosis not present

## 2014-05-28 DIAGNOSIS — J449 Chronic obstructive pulmonary disease, unspecified: Secondary | ICD-10-CM | POA: Diagnosis not present

## 2014-05-28 DIAGNOSIS — I259 Chronic ischemic heart disease, unspecified: Secondary | ICD-10-CM | POA: Diagnosis not present

## 2014-05-28 DIAGNOSIS — J4489 Other specified chronic obstructive pulmonary disease: Secondary | ICD-10-CM | POA: Diagnosis not present

## 2014-05-28 DIAGNOSIS — R079 Chest pain, unspecified: Secondary | ICD-10-CM | POA: Diagnosis not present

## 2014-05-28 DIAGNOSIS — E039 Hypothyroidism, unspecified: Secondary | ICD-10-CM | POA: Diagnosis not present

## 2014-05-31 ENCOUNTER — Ambulatory Visit (INDEPENDENT_AMBULATORY_CARE_PROVIDER_SITE_OTHER): Payer: Medicare Other | Admitting: Cardiology

## 2014-05-31 ENCOUNTER — Telehealth: Payer: Self-pay | Admitting: Cardiology

## 2014-05-31 ENCOUNTER — Encounter: Payer: Self-pay | Admitting: Cardiology

## 2014-05-31 VITALS — BP 134/64 | HR 57 | Ht 63.0 in | Wt 159.0 lb

## 2014-05-31 DIAGNOSIS — R079 Chest pain, unspecified: Secondary | ICD-10-CM | POA: Diagnosis not present

## 2014-05-31 NOTE — Progress Notes (Signed)
Clinical Summary Lauren [name] is a 80 y.o.female seen today for follow up of the following medical problems.  1. Chest pain - long history of chest pain from notes. Myoview 2009 without ischemia. Last seen by Dr Domenic Polite in 2013 with chest pain. Repeat MPI done at that time showed no ischemia.   - reports recent episodes of chest pressure. Started approx 2 weeks ago. [place]chest, 4-5/10. Typically occurs exertion, often resolves with sitting. +SOB. No N/V, no diaphoresis. Not positional. Lasts approx 5 minutes. Occurs daily. 1 pillow orthopnea that's new. No LE edema. Has noted some new DOE, example walking across the street in her neighbor. Unsure if similar to prior pain.  CAD risk factors: age, HTN, hyperlipidemia, former tobacco x 25 years   Past Medical History  Diagnosis Date  . Asthma   . Osteoarthritis   . Essential hypertension, benign   . Hyperlipidemia   . Back pain   . GERD (gastroesophageal reflux disease)   . History of cardiac catheterization     Reportedly normal coronaries in 2000  . COPD (chronic obstructive pulmonary disease)     O2 at night sometimes     Allergies  Allergen Reactions  . Penicillins Other (See Comments)    Unknown      Current Outpatient Prescriptions  Medication Sig Dispense Refill  . ALPRAZolam (XANAX) 0.25 MG tablet Take 0.25 mg by mouth daily as needed for anxiety.       . Calcium Carbonate-Vitamin D (CALCIUM + D PO) Take 1 tablet by mouth daily.       . diclofenac sodium (VOLTAREN) 1 % GEL Apply 4 g topically 4 (four) times daily.  5 Tube  5  . diphenhydrAMINE (BENADRYL) 25 mg capsule Take 1 capsule (25 mg total) by mouth every 4 (four) hours as needed for itching.  42 capsule  1  . guaiFENesin (MUCINEX) 600 MG 12 hr tablet Take 1,200 mg by mouth as needed. congestion      . HYDROcodone-acetaminophen (NORCO/VICODIN) 5-325 MG per tablet Take 1 tablet by mouth every 6 (six) hours as needed for pain.  60 tablet  0  .  lisinopril-hydrochlorothiazide (PRINZIDE,ZESTORETIC) 20-12.5 MG per tablet Take 1 tablet by mouth daily.      . Multiple Vitamin (MULTIVITAMIN WITH MINERALS) TABS tablet Take 1 tablet by mouth daily.      Marland Kitchen oxybutynin (DITROPAN-XL) 10 MG 24 hr tablet Take 10 mg by mouth daily as needed.       . polyethylene glycol (MIRALAX / GLYCOLAX) packet Take 17 g by mouth daily.       No current facility-administered medications for this visit.     Past Surgical History  Procedure Laterality Date  . Shoulder surgery      Rotator Cuff  . Ankle surgery      Rod and 13 screws  . Rotator cuff repair    . Appendectomy    . Breast biopsy  1996    Benign  . Abdominal hysterectomy  1994  . Colonoscopy  01/27/2011    Normal rectum/Sigmoid diverticula/Multiple colonic polyps removed (TUBULAR ADENOMA) Next TCS 01/2016  . Esophagogastroduodenoscopy  08/21/2003       Normal tubular esophagus status post passage of Maloney dilator as  described above/  2. Multiple linear antral erosions of uncertain significance, could be NSAID  . Esophagogastroduodenoscopy  08/04/2012    Procedure: ESOPHAGOGASTRODUODENOSCOPY (EGD);  Surgeon: Daneil Dolin, MD;  [facility];  Service: Endoscopy;  Laterality:  N/A;  7:30  . Esophageal dilation  08/04/2012    Procedure: ESOPHAGEAL DILATION;  Surgeon: Daneil Dolin, MD;  [facility];  Service: Endoscopy;;  . Dorsal compartment release Left 02/03/2013    Procedure: RELEASE DORSAL COMPARTMENT (DEQUERVAIN);  Surgeon: Carole Civil, MD;  Location: [facility];  Service: Orthopedics;  Laterality: Left;  . Trigger finger release Right 09/11/2013    Procedure: RIGHT LONG FINGER RELEASE TRIGGER FINGER;  Surgeon: Carole Civil, MD;  Location: [facility];  Service: Orthopedics;  Laterality: Right;  . Dorsal compartment release Right 09/11/2013    Procedure: RELEASE DORSAL COMPARTMENT (DEQUERVAIN);  Surgeon: Carole Civil, MD;  Location: [facility];  Service:  Orthopedics;  Laterality: Right;     Allergies  Allergen Reactions  . Penicillins Other (See Comments)    Unknown       Family History  Problem Relation Age of Onset  . Tuberculosis Mother 41    Deceased  . Kidney failure Father 24  . Colon cancer Neg Hx   . Asthma    . Arthritis       Social History Ms. Bolis reports that she quit smoking about 51 years ago. Her smoking use included Cigarettes. She has a 12.5 pack-year smoking history. She does not have any smokeless tobacco history on file. Ms. Cavalcante reports that she does not drink alcohol.   Review of Systems CONSTITUTIONAL: No weight loss, fever, chills, weakness or fatigue.  HEENT: Eyes: No visual loss, blurred vision, double vision or yellow sclerae.No hearing loss, sneezing, congestion, runny nose or sore throat.  SKIN: No rash or itching.  CARDIOVASCULAR: per HPI RESPIRATORY: No shortness of breath, cough or sputum.  GASTROINTESTINAL: No anorexia, nausea, vomiting or diarrhea. No abdominal pain or blood.  GENITOURINARY: No burning on urination, no polyuria NEUROLOGICAL: No headache, dizziness, syncope, paralysis, ataxia, numbness or tingling in the extremities. No change in bowel or bladder control.  MUSCULOSKELETAL: No muscle, back pain, joint pain or stiffness.  LYMPHATICS: No enlarged nodes. No history of splenectomy.  PSYCHIATRIC: No history of depression or anxiety.  ENDOCRINOLOGIC: No reports of sweating, cold or heat intolerance. No polyuria or polydipsia.  Marland Kitchen   Physical Examination p 57 bp 134/64 Wt 159 lbs BMI 28 Gen: resting comfortably, no acute distress HEENT: no scleral icterus, pupils equal round and reactive, no palptable cervical adenopathy,  CV: RRR, no m/r/g, no JVD, no carotid bruits Resp: Clear to auscultation bilaterally GI: abdomen is soft, non-tender, non-distended, normal bowel sounds, no hepatosplenomegaly MSK: extremities are warm, no edema.  Skin: warm, no rash Neuro:  no  focal deficits Psych: appropriate affect   Diagnostic Studies  09/2012 MPI IMPRESSION:  Low risk Lexiscan Myoview as outlined. There were no diagnostic ST-  segment abnormalities. Perfusion imaging is consistent with soft  tissue attenuation in the inferior wall, no definite ischemia.  LVEF 70%.  02/2008 Echo SUMMARY - Overall left ventricular systolic function was normal. Left ventricular ejection fraction was estimated to be 60 %. Left ventricular wall thickness was mildly increased. - The aortic valve was mildly calcified. - The left atrium was mildly dilated.    Assessment and Plan  1.Chest pain - long history of chest pain, with according to notes normal cath in 2000. Negative MPI in 2009 and 2013. - current symptoms more exertional than previously. She also notes some new 1 pillow orthopnea and DOE. Her EKG shows frequent PVCs which are new.  - will obtain echo in setting of  chest pain and DOE, if normal LVEF and no WMAs likely repeat non-invasive ischemic testing. - unsteady gait, usually cane chronically. Will need pharmacological stress if ordered.    F/u pending results of echo  Arnoldo Lenis, M.D., F.A.C.C.

## 2014-05-31 NOTE — Telephone Encounter (Signed)
Echo scheduled at [facility]  06/01/14 @ 1030

## 2014-05-31 NOTE — Telephone Encounter (Signed)
MCR, [employer] MCR SUP NO PAC RQD/CH

## 2014-05-31 NOTE — Patient Instructions (Signed)
Your physician has requested that you have an echocardiogram. Echocardiography is a painless test that uses sound waves to create images of your heart. It provides your doctor with information about the size and shape of your heart and how well your heart's chambers and valves are working. This procedure takes approximately one hour. There are no restrictions for this procedure. Continue all current medications. Follow up based off Echo results

## 2014-06-01 ENCOUNTER — Ambulatory Visit (HOSPITAL_COMMUNITY)
Admission: RE | Admit: 2014-06-01 | Discharge: 2014-06-01 | Disposition: A | Discharge status: Home / Self Care | Payer: Medicare Other | Source: Ambulatory Visit | Attending: Cardiology | Admitting: Cardiology

## 2014-06-01 DIAGNOSIS — Z87891 Personal history of nicotine dependence: Secondary | ICD-10-CM | POA: Insufficient documentation

## 2014-06-01 DIAGNOSIS — R0609 Other forms of dyspnea: Secondary | ICD-10-CM | POA: Diagnosis not present

## 2014-06-01 DIAGNOSIS — I059 Rheumatic mitral valve disease, unspecified: Secondary | ICD-10-CM | POA: Insufficient documentation

## 2014-06-01 DIAGNOSIS — I10 Essential (primary) hypertension: Secondary | ICD-10-CM | POA: Insufficient documentation

## 2014-06-01 DIAGNOSIS — J449 Chronic obstructive pulmonary disease, unspecified: Secondary | ICD-10-CM | POA: Insufficient documentation

## 2014-06-01 DIAGNOSIS — E785 Hyperlipidemia, unspecified: Secondary | ICD-10-CM | POA: Insufficient documentation

## 2014-06-01 DIAGNOSIS — J4489 Other specified chronic obstructive pulmonary disease: Secondary | ICD-10-CM | POA: Insufficient documentation

## 2014-06-01 DIAGNOSIS — R0989 Other specified symptoms and signs involving the circulatory and respiratory systems: Secondary | ICD-10-CM | POA: Insufficient documentation

## 2014-06-01 DIAGNOSIS — R079 Chest pain, unspecified: Secondary | ICD-10-CM | POA: Diagnosis not present

## 2014-06-01 DIAGNOSIS — I369 Nonrheumatic tricuspid valve disorder, unspecified: Secondary | ICD-10-CM | POA: Diagnosis not present

## 2014-06-01 NOTE — Progress Notes (Signed)
  Echocardiogram 2D Echocardiogram has been performed.  Gilliam, [place] 06/01/2014, 11:24 AM

## 2014-06-07 ENCOUNTER — Encounter: Payer: Self-pay | Admitting: *Deleted

## 2014-06-07 ENCOUNTER — Telehealth: Payer: Self-pay | Admitting: *Deleted

## 2014-06-07 DIAGNOSIS — R079 Chest pain, unspecified: Secondary | ICD-10-CM

## 2014-06-07 NOTE — Telephone Encounter (Signed)
Instructions regarding Lexiscan given to patient including holding Norvasc morning of test only.  Nothing to eat/drink 4 hours prior to test & no caffeine for 24 hours prior to test.  Patient verbalized understanding.

## 2014-06-07 NOTE — Telephone Encounter (Signed)
Notes Recorded by Laurine Blazer, LPN on 579FGE at 624THL AM Patient notified. Stated she is willing to do the Lexiscan stress test. Will put order in & forward to [facility]) for scheduling.

## 2014-06-07 NOTE — Telephone Encounter (Signed)
Message copied by Laurine Blazer on Thu Jun 07, 2014 11:18 AM ------      Message from: [place] F      Created: Fri Jun 01, 2014  1:28 PM       Normal echo. Due to her chest pain I would like to repeat her stress test. She needs as Lexiscan for chest pain, hold norvasc on day of test.            Zandra Abts MD ------

## 2014-06-21 ENCOUNTER — Encounter (HOSPITAL_COMMUNITY)
Admission: RE | Admit: 2014-06-21 | Discharge: 2014-06-21 | Disposition: A | Discharge status: Home / Self Care | Payer: Medicare Other | Source: Ambulatory Visit | Attending: Cardiology | Admitting: Cardiology

## 2014-06-21 ENCOUNTER — Ambulatory Visit (HOSPITAL_COMMUNITY)
Admission: RE | Admit: 2014-06-21 | Discharge: 2014-06-21 | Disposition: A | Discharge status: Home / Self Care | Payer: Medicare Other | Source: Ambulatory Visit | Attending: Cardiology | Admitting: Cardiology

## 2014-06-21 ENCOUNTER — Encounter (HOSPITAL_COMMUNITY): Payer: Self-pay

## 2014-06-21 DIAGNOSIS — R079 Chest pain, unspecified: Secondary | ICD-10-CM | POA: Insufficient documentation

## 2014-06-21 MED ORDER — TECHNETIUM TC 99M SESTAMIBI GENERIC - CARDIOLITE
30.0000 | Freq: Once | INTRAVENOUS | Status: AC | PRN
  Administered 2014-06-21: 30 via INTRAVENOUS

## 2014-06-21 MED ORDER — TECHNETIUM TC 99M SESTAMIBI - CARDIOLITE
10.0000 | Freq: Once | INTRAVENOUS | Status: AC | PRN
  Administered 2014-06-21: 09:00:00 10 via INTRAVENOUS

## 2014-06-21 MED ORDER — SODIUM CHLORIDE 0.9 % IJ SOLN
INTRAMUSCULAR | Status: AC
  Administered 2014-06-21: 10 mL via INTRAVENOUS
  Filled 2014-06-21: qty 10

## 2014-06-21 MED ORDER — REGADENOSON 0.4 MG/5ML IV SOLN
0.4000 mg | Freq: Once | INTRAVENOUS | Status: AC | PRN
  Administered 2014-06-21: 0.4 mg via INTRAVENOUS

## 2014-06-21 MED ORDER — SODIUM CHLORIDE 0.9 % IJ SOLN
10.0000 mL | INTRAMUSCULAR | Status: DC | PRN
  Administered 2014-06-21: 10 mL via INTRAVENOUS

## 2014-06-21 MED ORDER — REGADENOSON 0.4 MG/5ML IV SOLN
INTRAVENOUS | Status: AC
  Administered 2014-06-21: 0.4 mg via INTRAVENOUS
  Filled 2014-06-21: qty 5

## 2014-06-21 NOTE — Progress Notes (Signed)
Stress Lab Nurses Notes - Lauren [name]  Lauren [name] 06/21/2014 Reason for doing test: Chest Pain Type of test: Wille Glaser Nurse performing test: Gerrit Halls, RN Nuclear Medicine Tech: Melburn Hake Echo Tech: Not Applicable MD performing test: S. McDowell/K.Purcell Nails NP Family MD: Luan Pulling Test explained and consent signed: Yes.   IV started: 22g jelco, Saline lock flushed, No redness or edema and Saline lock started in radiology Symptoms: Nausea,  shaky & weak feeling all over Treatment/Intervention: None Reason test stopped: protocol completed After recovery IV was: Discontinued via X-ray tech and No redness or edema Patient to return to Nuc. Med at : 11:50 Patient discharged: Home Patient's Condition upon discharge was: stable Comments: During test BP 117/57 & HR 93 .  Recovery BP 135/65 & HR 70. Nausea resolved, but continues to feel shaky. Lauren [name] T

## 2014-06-25 ENCOUNTER — Telehealth: Payer: Self-pay | Admitting: *Deleted

## 2014-06-25 NOTE — Telephone Encounter (Signed)
Notes Recorded by Laurine Blazer, LPN on 624THL at 579FGE PM Patient notified. Will forward results to PMD.

## 2014-06-25 NOTE — Telephone Encounter (Signed)
Message copied by Laurine Blazer on Mon Jun 25, 2014  3:18 PM ------      Message from: [place] F      Created: Mon Jun 25, 2014 10:03 AM       Normal stress test. F/u 6 months ------

## 2014-07-18 ENCOUNTER — Other Ambulatory Visit: Payer: Self-pay | Admitting: *Deleted

## 2014-07-18 ENCOUNTER — Telehealth: Payer: Self-pay | Admitting: Orthopedic Surgery

## 2014-07-18 DIAGNOSIS — M25532 Pain in left wrist: Secondary | ICD-10-CM

## 2014-07-18 MED ORDER — HYDROCODONE-ACETAMINOPHEN 5-325 MG PO TABS: 1.0000 | ORAL_TABLET | Freq: Four times a day (QID) | ORAL | Status: DC | PRN

## 2014-07-18 NOTE — Telephone Encounter (Signed)
Patient aware prescription is available for pick up

## 2014-07-18 NOTE — Telephone Encounter (Signed)
Bri Bianchi wants a prescription for Hydrocodone 5/325

## 2014-07-26 ENCOUNTER — Other Ambulatory Visit (HOSPITAL_COMMUNITY): Payer: Self-pay | Admitting: Pulmonary Disease

## 2014-07-26 DIAGNOSIS — R5381 Other malaise: Secondary | ICD-10-CM | POA: Diagnosis not present

## 2014-07-26 DIAGNOSIS — J449 Chronic obstructive pulmonary disease, unspecified: Secondary | ICD-10-CM | POA: Diagnosis not present

## 2014-07-26 DIAGNOSIS — J441 Chronic obstructive pulmonary disease with (acute) exacerbation: Secondary | ICD-10-CM | POA: Diagnosis not present

## 2014-07-26 DIAGNOSIS — R110 Nausea: Secondary | ICD-10-CM

## 2014-07-26 DIAGNOSIS — J45901 Unspecified asthma with (acute) exacerbation: Secondary | ICD-10-CM | POA: Diagnosis not present

## 2014-07-26 DIAGNOSIS — I119 Hypertensive heart disease without heart failure: Secondary | ICD-10-CM | POA: Diagnosis not present

## 2014-07-26 DIAGNOSIS — R5383 Other fatigue: Secondary | ICD-10-CM | POA: Diagnosis not present

## 2014-08-03 ENCOUNTER — Ambulatory Visit (HOSPITAL_COMMUNITY)
Admission: RE | Admit: 2014-08-03 | Discharge: 2014-08-03 | Disposition: A | Discharge status: Home / Self Care | Payer: Medicare Other | Source: Ambulatory Visit | Attending: Pulmonary Disease | Admitting: Pulmonary Disease

## 2014-08-03 DIAGNOSIS — K7689 Other specified diseases of liver: Secondary | ICD-10-CM | POA: Diagnosis not present

## 2014-08-03 DIAGNOSIS — R110 Nausea: Secondary | ICD-10-CM | POA: Diagnosis not present

## 2014-08-15 ENCOUNTER — Other Ambulatory Visit (HOSPITAL_COMMUNITY): Payer: Self-pay | Admitting: Pulmonary Disease

## 2014-08-15 DIAGNOSIS — Z139 Encounter for screening, unspecified: Secondary | ICD-10-CM

## 2014-08-15 DIAGNOSIS — Z1231 Encounter for screening mammogram for malignant neoplasm of breast: Secondary | ICD-10-CM

## 2014-08-21 DIAGNOSIS — Z23 Encounter for immunization: Secondary | ICD-10-CM | POA: Diagnosis not present

## 2014-09-14 ENCOUNTER — Other Ambulatory Visit (HOSPITAL_COMMUNITY): Payer: Self-pay | Admitting: Pulmonary Disease

## 2014-09-14 DIAGNOSIS — J449 Chronic obstructive pulmonary disease, unspecified: Secondary | ICD-10-CM | POA: Diagnosis not present

## 2014-09-14 DIAGNOSIS — R109 Unspecified abdominal pain: Secondary | ICD-10-CM | POA: Diagnosis not present

## 2014-09-14 DIAGNOSIS — I119 Hypertensive heart disease without heart failure: Secondary | ICD-10-CM | POA: Diagnosis not present

## 2014-09-14 DIAGNOSIS — N3281 Overactive bladder: Secondary | ICD-10-CM | POA: Diagnosis not present

## 2014-09-18 ENCOUNTER — Other Ambulatory Visit (HOSPITAL_COMMUNITY): Payer: Medicare Other

## 2014-09-19 ENCOUNTER — Encounter (HOSPITAL_COMMUNITY)
Admission: RE | Admit: 2014-09-19 | Discharge: 2014-09-19 | Disposition: A | Discharge status: Home / Self Care | Payer: Medicare Other | Source: Ambulatory Visit | Attending: Diagnostic Radiology | Admitting: Diagnostic Radiology

## 2014-09-19 ENCOUNTER — Encounter (HOSPITAL_COMMUNITY): Payer: Self-pay

## 2014-09-19 ENCOUNTER — Ambulatory Visit (HOSPITAL_COMMUNITY)
Admission: RE | Admit: 2014-09-19 | Discharge: 2014-09-19 | Disposition: A | Discharge status: Home / Self Care | Payer: Medicare Other | Source: Ambulatory Visit | Attending: Pulmonary Disease | Admitting: Pulmonary Disease

## 2014-09-19 DIAGNOSIS — R1011 Right upper quadrant pain: Secondary | ICD-10-CM | POA: Diagnosis not present

## 2014-09-19 DIAGNOSIS — Z1231 Encounter for screening mammogram for malignant neoplasm of breast: Secondary | ICD-10-CM | POA: Diagnosis not present

## 2014-09-19 DIAGNOSIS — R109 Unspecified abdominal pain: Secondary | ICD-10-CM | POA: Diagnosis not present

## 2014-09-19 MED ORDER — TECHNETIUM TC 99M MEBROFENIN IV KIT
5.0000 | PACK | Freq: Once | INTRAVENOUS | Status: AC | PRN
  Administered 2014-09-19: 5 via INTRAVENOUS

## 2014-09-19 MED ORDER — STERILE WATER FOR INJECTION IJ SOLN
INTRAMUSCULAR | Status: AC
  Filled 2014-09-19: qty 10

## 2014-09-19 MED ORDER — SINCALIDE 5 MCG IJ SOLR
INTRAMUSCULAR | Status: AC
  Filled 2014-09-19: qty 5

## 2014-09-20 ENCOUNTER — Other Ambulatory Visit: Payer: Self-pay | Admitting: Gastroenterology

## 2014-09-24 DIAGNOSIS — H04129 Dry eye syndrome of unspecified lacrimal gland: Secondary | ICD-10-CM | POA: Diagnosis not present

## 2014-09-26 DIAGNOSIS — N6019 Diffuse cystic mastopathy of unspecified breast: Secondary | ICD-10-CM | POA: Diagnosis not present

## 2014-09-26 DIAGNOSIS — Z1239 Encounter for other screening for malignant neoplasm of breast: Secondary | ICD-10-CM | POA: Diagnosis not present

## 2014-11-13 DIAGNOSIS — I119 Hypertensive heart disease without heart failure: Secondary | ICD-10-CM | POA: Diagnosis not present

## 2014-11-13 DIAGNOSIS — M159 Polyosteoarthritis, unspecified: Secondary | ICD-10-CM | POA: Diagnosis not present

## 2014-11-13 DIAGNOSIS — I2510 Atherosclerotic heart disease of native coronary artery without angina pectoris: Secondary | ICD-10-CM | POA: Diagnosis not present

## 2014-11-13 DIAGNOSIS — J449 Chronic obstructive pulmonary disease, unspecified: Secondary | ICD-10-CM | POA: Diagnosis not present

## 2014-11-19 DIAGNOSIS — E032 Hypothyroidism due to medicaments and other exogenous substances: Secondary | ICD-10-CM | POA: Diagnosis not present

## 2014-11-26 DIAGNOSIS — I10 Essential (primary) hypertension: Secondary | ICD-10-CM | POA: Diagnosis not present

## 2014-11-26 DIAGNOSIS — E032 Hypothyroidism due to medicaments and other exogenous substances: Secondary | ICD-10-CM | POA: Diagnosis not present

## 2014-11-26 DIAGNOSIS — E782 Mixed hyperlipidemia: Secondary | ICD-10-CM | POA: Diagnosis not present

## 2015-03-05 ENCOUNTER — Other Ambulatory Visit (HOSPITAL_COMMUNITY): Payer: Self-pay | Admitting: Pulmonary Disease

## 2015-03-05 DIAGNOSIS — R413 Other amnesia: Secondary | ICD-10-CM | POA: Diagnosis not present

## 2015-03-05 DIAGNOSIS — G454 Transient global amnesia: Secondary | ICD-10-CM

## 2015-03-05 LAB — VITAMIN B12: Vitamin B-12: 553

## 2015-03-07 ENCOUNTER — Ambulatory Visit (HOSPITAL_COMMUNITY)
Admission: RE | Admit: 2015-03-07 | Discharge: 2015-03-07 | Disposition: A | Discharge status: Home / Self Care | Payer: Medicare Other | Source: Ambulatory Visit | Attending: Pulmonary Disease | Admitting: Pulmonary Disease

## 2015-03-07 DIAGNOSIS — G454 Transient global amnesia: Secondary | ICD-10-CM | POA: Diagnosis not present

## 2015-04-03 DIAGNOSIS — Z1239 Encounter for other screening for malignant neoplasm of breast: Secondary | ICD-10-CM | POA: Diagnosis not present

## 2015-04-03 DIAGNOSIS — N6019 Diffuse cystic mastopathy of unspecified breast: Secondary | ICD-10-CM | POA: Diagnosis not present

## 2015-04-17 DIAGNOSIS — F419 Anxiety disorder, unspecified: Secondary | ICD-10-CM | POA: Diagnosis not present

## 2015-04-17 DIAGNOSIS — R0789 Other chest pain: Secondary | ICD-10-CM | POA: Diagnosis not present

## 2015-04-17 DIAGNOSIS — K219 Gastro-esophageal reflux disease without esophagitis: Secondary | ICD-10-CM | POA: Diagnosis not present

## 2015-04-17 DIAGNOSIS — J449 Chronic obstructive pulmonary disease, unspecified: Secondary | ICD-10-CM | POA: Diagnosis not present

## 2015-05-10 ENCOUNTER — Encounter: Payer: Self-pay | Admitting: Internal Medicine

## 2015-05-10 ENCOUNTER — Ambulatory Visit (INDEPENDENT_AMBULATORY_CARE_PROVIDER_SITE_OTHER): Payer: Medicare Other | Admitting: Internal Medicine

## 2015-05-10 VITALS — BP 124/62 | HR 60 | Ht 63.0 in | Wt 167.0 lb

## 2015-05-10 DIAGNOSIS — R072 Precordial pain: Secondary | ICD-10-CM | POA: Diagnosis not present

## 2015-05-10 NOTE — Patient Instructions (Signed)
Your physician recommends that you schedule a follow-up appointment in: to be determined   Your physician recommends that you continue on your current medications as directed. Please refer to the Current Medication list given to you today.   Thanks for choosing [facility]!!!

## 2015-05-10 NOTE — Progress Notes (Signed)
Cardiology Office Note   Date:  05/10/2015   ID:  Lauren [name], DOB [birth year], MRN [id number]  PCP:  Alonza Bogus, MD  Cardiologist:   Dorris Carnes, MD   No chief complaint on file.     History of Present Illness: Lauren [name] is a 81 y.o. female with a long history of CP  Last seen by Lauren [name] in summer 2015  Has been seen by Lauren [name] in past.  Normal myoview in 2009  Repeate myoview  2013 negative    In 2015 repeat myoview was low risk  LVEF was 40%   Has had PVCs on EKG in past.  Echo normrmal in 2015  Patient says over the past year she feels more SOB with activity  Gives out easier.   Chest pressure at times with activity SOB before bed. Some wheezing but isnt wheezing now.   SOB walking to bathroom   Referred by Lauren [name] for f/u Tired.    Note coronary calcification noted on CT of chest in 2014  Hx strictures of esophagus  Food slow to go down  Not getting stuck now.    Loses balance  Has fallen a couple times   Current Outpatient Prescriptions  Medication Sig Dispense Refill  . albuterol (PROVENTIL) (2.5 MG/3ML) 0.083% nebulizer solution Take 2.5 mg by nebulization every 6 (six) hours as needed for wheezing or shortness of breath.    . ALPRAZolam (XANAX) 0.25 MG tablet Take 0.25 mg by mouth 3 (three) times daily as needed for anxiety.     Marland Kitchen amLODipine (NORVASC) 10 MG tablet Take 10 mg by mouth daily.    Marland Kitchen aspirin 81 MG tablet Take 81 mg by mouth daily.    Marland Kitchen atorvastatin (LIPITOR) 20 MG tablet Take 20 mg by mouth daily.    . Calcium Carbonate-Vitamin D (CALCIUM + D PO) Take 1 tablet by mouth daily.     . diclofenac sodium (VOLTAREN) 1 % GEL Apply 4 g topically 4 (four) times daily. 5 Tube 5  . guaiFENesin (MUCINEX) 600 MG 12 hr tablet Take 1,200 mg by mouth as needed. congestion    . levothyroxine (SYNTHROID, LEVOTHROID) 88 MCG tablet Take 88 mcg by mouth daily before breakfast.    . losartan-hydrochlorothiazide (HYZAAR) 100-12.5 MG per tablet Take 1  tablet by mouth daily.    . Multiple Vitamin (MULTIVITAMIN WITH MINERALS) TABS tablet Take 1 tablet by mouth daily.    . nitroGLYCERIN (NITROSTAT) 0.4 MG SL tablet Place 0.4 mg under the tongue every 5 (five) minutes as needed for chest pain.    Marland Kitchen oxybutynin (DITROPAN-XL) 10 MG 24 hr tablet Take 10 mg by mouth daily as needed.     . polyethylene glycol (MIRALAX / GLYCOLAX) packet Take 17 g by mouth daily as needed.     . polyethylene glycol powder (GLYCOLAX/MIRALAX) powder MIX 1 CAPFUL (17 GRAMS) WITH 8OZ OF WATER OR JUICE DAILY. 527 g 3   No current facility-administered medications for this visit.    Allergies:   Penicillins   Past Medical History  Diagnosis Date  . Asthma   . Osteoarthritis   . Essential hypertension, benign   . Hyperlipidemia   . Back pain   . GERD (gastroesophageal reflux disease)   . History of cardiac catheterization     Reportedly normal coronaries in 2000  . COPD (chronic obstructive pulmonary disease)     O2 at night sometimes    Past Surgical History  Procedure Laterality  Date  . Shoulder surgery      Rotator Cuff  . Ankle surgery      Rod and 13 screws  . Rotator cuff repair    . Appendectomy    . Breast biopsy  1996    Benign  . Abdominal hysterectomy  1994  . Colonoscopy  01/27/2011    Normal rectum/Sigmoid diverticula/Multiple colonic polyps removed (TUBULAR ADENOMA) Next TCS 01/2016  . Esophagogastroduodenoscopy  08/21/2003       Normal tubular esophagus status post passage of Maloney dilator as  described above/  2. Multiple linear antral erosions of uncertain significance, could be NSAID  . Esophagogastroduodenoscopy  08/04/2012    Procedure: ESOPHAGOGASTRODUODENOSCOPY (EGD);  Surgeon: Daneil Dolin, MD;  [facility]: [facility];  Service: Endoscopy;  Laterality: N/A;  7:30  . Esophageal dilation  08/04/2012    Procedure: ESOPHAGEAL DILATION;  Surgeon: Daneil Dolin, MD;  Location: [facility];  Service: Endoscopy;;  . Dorsal  compartment release Left 02/03/2013    Procedure: RELEASE DORSAL COMPARTMENT (DEQUERVAIN);  Surgeon: Carole Civil, MD;  Location: [facility];  Service: Orthopedics;  Laterality: Left;  . Trigger finger release Right 09/11/2013    Procedure: RIGHT LONG FINGER RELEASE TRIGGER FINGER;  Surgeon: Carole Civil, MD;  Location: [facility];  Service: Orthopedics;  Laterality: Right;  . Dorsal compartment release Right 09/11/2013    Procedure: RELEASE DORSAL COMPARTMENT (DEQUERVAIN);  Surgeon: Carole Civil, MD;  Location: [facility];  Service: Orthopedics;  Laterality: Right;     Social History:  The patient  reports that she quit smoking about 52 years ago. Her smoking use included Cigarettes. She has a 12.5 pack-year smoking history. She has never used smokeless tobacco. She reports that she does not drink alcohol or use illicit drugs.   Family History:  The patient's family history includes Arthritis in an other family member; Asthma in an other family member; Kidney failure (age of onset: 67) in her father; Tuberculosis (age of onset: 75) in her mother. There is no history of Colon cancer.    ROS:  Please see the history of present illness. All other systems are reviewed and  Negative to the above problem except as noted.    PHYSICAL EXAM: VS:  BP 124/62 mmHg  Pulse 60  Ht 5\' 3"  (1.6 m)  Wt 167 lb (75.751 kg)  BMI 29.59 kg/m2  SpO2 95%  GEN: Well nourished, well developed, in no acute distress HEENT: normal Neck: no JVD, carotid bruits, or masses Cardiac: RRR; no murmurs, rubs, or gallops,no edema  Respiratory:  clear to auscultation bilaterally, Mild decreased flow  No wheezing   GI: soft, nontender, nondistended, + BS  No hepatomegaly  MS: no deformity Moving all extremities   Skin: warm and dry, no rash Neuro:  Strength and sensation are intact Psych: euthymic mood, full affect   EKG:  EKG is ordered today.  SB 59  Nonspecific ST changes     Lipid Panel No results found for:  CHOL, TRIG, HDL, CHOLHDL, VLDL, LDLCALC, LDLDIRECT    Wt Readings from Last 3 Encounters:  05/10/15 167 lb (75.751 kg)  05/31/14 159 lb (72.122 kg)  04/05/14 181 lb (82.101 kg)      ASSESSMENT AND PLAN: 1.  Chest pressure /SOB  Patient's syomptoms are progressive  She has had 3 neg myoviews in the past several years   I would recomm R and L heart cath to define pressures and anatomy  Patient would  like to find out what is going on   After this I have spoken with daughter Lauren [name]  [phone]  She thinks depression may be playing role  Thiks mom may be getting a litte better  Will review with E hawkins before scheduling      Signed, Dorris Carnes, MD  05/10/2015 1:25 PM    [facility] [address], [place],   [address] Phone: [phone]; Fax: [phone]

## 2015-05-14 ENCOUNTER — Telehealth: Payer: Self-pay | Admitting: Internal Medicine

## 2015-05-14 NOTE — Telephone Encounter (Signed)
Please call patient's daughter regarding patient may need cath / tg

## 2015-05-14 NOTE — Telephone Encounter (Signed)
Daughter states she spoke with Dr. Harrington Challenger today.

## 2015-05-15 DIAGNOSIS — J449 Chronic obstructive pulmonary disease, unspecified: Secondary | ICD-10-CM | POA: Diagnosis not present

## 2015-05-15 DIAGNOSIS — I10 Essential (primary) hypertension: Secondary | ICD-10-CM | POA: Diagnosis not present

## 2015-05-15 DIAGNOSIS — I119 Hypertensive heart disease without heart failure: Secondary | ICD-10-CM | POA: Diagnosis not present

## 2015-05-15 DIAGNOSIS — F329 Major depressive disorder, single episode, unspecified: Secondary | ICD-10-CM | POA: Diagnosis not present

## 2015-05-21 DIAGNOSIS — I10 Essential (primary) hypertension: Secondary | ICD-10-CM | POA: Diagnosis not present

## 2015-05-21 DIAGNOSIS — E032 Hypothyroidism due to medicaments and other exogenous substances: Secondary | ICD-10-CM | POA: Diagnosis not present

## 2015-05-21 DIAGNOSIS — E782 Mixed hyperlipidemia: Secondary | ICD-10-CM | POA: Diagnosis not present

## 2015-05-28 DIAGNOSIS — E782 Mixed hyperlipidemia: Secondary | ICD-10-CM | POA: Diagnosis not present

## 2015-05-28 DIAGNOSIS — E032 Hypothyroidism due to medicaments and other exogenous substances: Secondary | ICD-10-CM | POA: Diagnosis not present

## 2015-05-28 DIAGNOSIS — I10 Essential (primary) hypertension: Secondary | ICD-10-CM | POA: Diagnosis not present

## 2015-06-26 DIAGNOSIS — F329 Major depressive disorder, single episode, unspecified: Secondary | ICD-10-CM | POA: Diagnosis not present

## 2015-06-26 DIAGNOSIS — I2510 Atherosclerotic heart disease of native coronary artery without angina pectoris: Secondary | ICD-10-CM | POA: Diagnosis not present

## 2015-06-26 DIAGNOSIS — E039 Hypothyroidism, unspecified: Secondary | ICD-10-CM | POA: Diagnosis not present

## 2015-06-26 DIAGNOSIS — I10 Essential (primary) hypertension: Secondary | ICD-10-CM | POA: Diagnosis not present

## 2015-07-22 ENCOUNTER — Ambulatory Visit (INDEPENDENT_AMBULATORY_CARE_PROVIDER_SITE_OTHER): Payer: Medicare Other | Admitting: Psychology

## 2015-07-22 DIAGNOSIS — F411 Generalized anxiety disorder: Secondary | ICD-10-CM | POA: Diagnosis not present

## 2015-07-22 DIAGNOSIS — F410 Panic disorder [episodic paroxysmal anxiety] without agoraphobia: Secondary | ICD-10-CM | POA: Diagnosis not present

## 2015-07-23 ENCOUNTER — Encounter (HOSPITAL_COMMUNITY): Payer: Self-pay | Admitting: Psychology
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
## 2015-08-05 ENCOUNTER — Ambulatory Visit (INDEPENDENT_AMBULATORY_CARE_PROVIDER_SITE_OTHER): Payer: Medicare Other | Admitting: Psychology

## 2015-08-05 DIAGNOSIS — F410 Panic disorder [episodic paroxysmal anxiety] without agoraphobia: Secondary | ICD-10-CM | POA: Diagnosis not present

## 2015-08-05 DIAGNOSIS — F411 Generalized anxiety disorder: Secondary | ICD-10-CM | POA: Diagnosis not present

## 2015-08-20 ENCOUNTER — Other Ambulatory Visit (HOSPITAL_COMMUNITY): Payer: Self-pay | Admitting: Pulmonary Disease

## 2015-08-20 DIAGNOSIS — Z1231 Encounter for screening mammogram for malignant neoplasm of breast: Secondary | ICD-10-CM

## 2015-08-26 ENCOUNTER — Encounter (HOSPITAL_COMMUNITY): Payer: Self-pay | Admitting: Psychology

## 2015-08-26 ENCOUNTER — Ambulatory Visit (INDEPENDENT_AMBULATORY_CARE_PROVIDER_SITE_OTHER): Payer: Medicare Other | Admitting: Psychology

## 2015-08-26 DIAGNOSIS — F410 Panic disorder [episodic paroxysmal anxiety] without agoraphobia: Secondary | ICD-10-CM | POA: Diagnosis not present

## 2015-08-26 DIAGNOSIS — F411 Generalized anxiety disorder: Secondary | ICD-10-CM | POA: Diagnosis not present
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
## 2015-08-29 DIAGNOSIS — Z23 Encounter for immunization: Secondary | ICD-10-CM | POA: Diagnosis not present

## 2015-09-02 ENCOUNTER — Other Ambulatory Visit: Payer: Self-pay | Admitting: "Endocrinology

## 2015-09-05 ENCOUNTER — Other Ambulatory Visit: Payer: Self-pay | Admitting: "Endocrinology

## 2015-09-11 DIAGNOSIS — J449 Chronic obstructive pulmonary disease, unspecified: Secondary | ICD-10-CM | POA: Diagnosis not present

## 2015-09-11 DIAGNOSIS — I10 Essential (primary) hypertension: Secondary | ICD-10-CM | POA: Diagnosis not present

## 2015-09-11 DIAGNOSIS — K219 Gastro-esophageal reflux disease without esophagitis: Secondary | ICD-10-CM | POA: Diagnosis not present

## 2015-09-11 DIAGNOSIS — I119 Hypertensive heart disease without heart failure: Secondary | ICD-10-CM | POA: Diagnosis not present

## 2015-09-16 ENCOUNTER — Ambulatory Visit (HOSPITAL_COMMUNITY): Payer: Self-pay | Admitting: Psychology

## 2015-09-25 ENCOUNTER — Ambulatory Visit (HOSPITAL_COMMUNITY)
Admission: RE | Admit: 2015-09-25 | Discharge: 2015-09-25 | Disposition: A | Discharge status: Home / Self Care | Payer: Medicare Other | Source: Ambulatory Visit | Attending: Pulmonary Disease | Admitting: Pulmonary Disease

## 2015-09-25 DIAGNOSIS — Z1231 Encounter for screening mammogram for malignant neoplasm of breast: Secondary | ICD-10-CM | POA: Diagnosis not present

## 2015-09-25 LAB — HM MAMMOGRAPHY

## 2015-09-26 ENCOUNTER — Ambulatory Visit (HOSPITAL_COMMUNITY): Payer: Self-pay | Admitting: Psychology

## 2015-10-01 ENCOUNTER — Ambulatory Visit (INDEPENDENT_AMBULATORY_CARE_PROVIDER_SITE_OTHER): Payer: Medicare Other | Admitting: Psychology

## 2015-10-01 DIAGNOSIS — F410 Panic disorder [episodic paroxysmal anxiety] without agoraphobia: Secondary | ICD-10-CM | POA: Diagnosis not present

## 2015-10-01 DIAGNOSIS — F411 Generalized anxiety disorder: Secondary | ICD-10-CM | POA: Diagnosis not present

## 2015-10-04 ENCOUNTER — Encounter (HOSPITAL_COMMUNITY): Payer: Self-pay | Admitting: Psychology
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
## 2015-11-05 ENCOUNTER — Other Ambulatory Visit: Payer: Self-pay | Admitting: "Endocrinology

## 2015-11-22 DIAGNOSIS — E032 Hypothyroidism due to medicaments and other exogenous substances: Secondary | ICD-10-CM | POA: Diagnosis not present

## 2015-11-22 DIAGNOSIS — E782 Mixed hyperlipidemia: Secondary | ICD-10-CM | POA: Diagnosis not present

## 2015-11-22 DIAGNOSIS — I10 Essential (primary) hypertension: Secondary | ICD-10-CM | POA: Diagnosis not present

## 2015-11-22 LAB — TSH: TSH: 2.34 u[IU]/mL (ref <=5.90)

## 2015-11-28 ENCOUNTER — Ambulatory Visit (INDEPENDENT_AMBULATORY_CARE_PROVIDER_SITE_OTHER): Payer: Medicare Other | Admitting: "Endocrinology

## 2015-11-28 ENCOUNTER — Encounter: Payer: Self-pay | Admitting: "Endocrinology

## 2015-11-28 VITALS — BP 159/84 | HR 64 | Ht 65.0 in | Wt 155.0 lb

## 2015-11-28 DIAGNOSIS — E890 Postprocedural hypothyroidism: Secondary | ICD-10-CM | POA: Insufficient documentation

## 2015-11-28 DIAGNOSIS — E032 Hypothyroidism due to medicaments and other exogenous substances: Secondary | ICD-10-CM

## 2015-11-28 MED ORDER — LEVOTHYROXINE SODIUM 100 MCG PO TABS: 100.0000 ug | ORAL_TABLET | Freq: Every day | ORAL | Status: DC

## 2015-11-28 NOTE — Progress Notes (Signed)
Subjective:    Patient ID: Lauren [name], female    DOB: 09-09-1934, PCP Alonza Bogus, MD   Past Medical History  Diagnosis Date  . Asthma   . Osteoarthritis   . Essential hypertension, benign   . Hyperlipidemia   . Back pain   . GERD (gastroesophageal reflux disease)   . History of cardiac catheterization     Reportedly normal coronaries in 2000  . COPD (chronic obstructive pulmonary disease) (HCC)     O2 at night sometimes   Past Surgical History  Procedure Laterality Date  . Shoulder surgery      Rotator Cuff  . Ankle surgery      Rod and 13 screws  . Rotator cuff repair    . Appendectomy    . Breast biopsy  1996    Benign  . Abdominal hysterectomy  1994  . Colonoscopy  01/27/2011    Normal rectum/Sigmoid diverticula/Multiple colonic polyps removed (TUBULAR ADENOMA) Next TCS 01/2016  . Esophagogastroduodenoscopy  08/21/2003       Normal tubular esophagus status post passage of Maloney dilator as  described above/  2. Multiple linear antral erosions of uncertain significance, could be NSAID  . Esophagogastroduodenoscopy  08/04/2012    Procedure: ESOPHAGOGASTRODUODENOSCOPY (EGD);  Surgeon: Daneil Dolin, MD;  Location: [facility];  Service: Endoscopy;  Laterality: N/A;  7:30  . Esophageal dilation  08/04/2012    Procedure: ESOPHAGEAL DILATION;  Surgeon: Daneil Dolin, MD;  Location: [facility];  Service: Endoscopy;;  . Dorsal compartment release Left 02/03/2013    Procedure: RELEASE DORSAL COMPARTMENT (DEQUERVAIN);  Surgeon: Carole Civil, MD;  Location: [facility];  Service: Orthopedics;  Laterality: Left;  . Trigger finger release Right 09/11/2013    Procedure: RIGHT LONG FINGER RELEASE TRIGGER FINGER;  Surgeon: Carole Civil, MD;  Location: [facility];  Service: Orthopedics;  Laterality: Right;  . Dorsal compartment release Right 09/11/2013    Procedure: RELEASE DORSAL COMPARTMENT (DEQUERVAIN);  Surgeon: Carole Civil, MD;  Location: [facility];   Service: Orthopedics;  Laterality: Right;   Social History   Social History  . Marital Status: Divorced    Spouse Name: N/A  . Number of Children: 4  . Years of Education: N/A   Occupational History  .     Social History Main Topics  . Smoking status: Former Smoker -- 0.50 packs/day for 25 years    Types: Cigarettes    Quit date: 01/31/1963  . Smokeless tobacco: Never Used  . Alcohol Use: No  . Drug Use: No  . Sexual Activity: Yes    Birth Control/ Protection: Surgical   Other Topics Concern  . None   Social History Narrative   Outpatient Encounter Prescriptions as of 11/28/2015  Medication Sig  . albuterol (PROVENTIL) (2.5 MG/3ML) 0.083% nebulizer solution Take 2.5 mg by nebulization every 6 (six) hours as needed for wheezing or shortness of breath.  . ALPRAZolam (XANAX) 0.25 MG tablet Take 0.25 mg by mouth 3 (three) times daily as needed for anxiety.   Marland Kitchen amLODipine (NORVASC) 10 MG tablet TAKE (1) TABLET BY MOUTH EACH MORNING FOR BLOOD PRESSURE.  Marland Kitchen aspirin 81 MG tablet Take 81 mg by mouth daily.  Marland Kitchen atorvastatin (LIPITOR) 20 MG tablet TAKE ONE TABLET BY MOUTH DAILY FOR CHOLESTEROL.  . Calcium Carbonate-Vitamin D (CALCIUM + D PO) Take 1 tablet by mouth daily.   . diclofenac sodium (VOLTAREN) 1 % GEL Apply 4 g topically 4 (four) times  daily.  . escitalopram (LEXAPRO) 20 MG tablet Take 20 mg by mouth daily.  Marland Kitchen levothyroxine (SYNTHROID, LEVOTHROID) 100 MCG tablet Take 1 tablet (100 mcg total) by mouth daily before breakfast.  . losartan-hydrochlorothiazide (HYZAAR) 100-12.5 MG per tablet Take 1 tablet by mouth daily.  . Multiple Vitamin (MULTIVITAMIN WITH MINERALS) TABS tablet Take 1 tablet by mouth daily.  . nitroGLYCERIN (NITROSTAT) 0.4 MG SL tablet Place 0.4 mg under the tongue every 5 (five) minutes as needed for chest pain.  Marland Kitchen oxybutynin (DITROPAN-XL) 10 MG 24 hr tablet Take 10 mg by mouth daily as needed.   . polyethylene glycol (MIRALAX / GLYCOLAX) packet Take 17 g by  mouth daily as needed.   . polyethylene glycol powder (GLYCOLAX/MIRALAX) powder MIX 1 CAPFUL (17 GRAMS) WITH 8OZ OF WATER OR JUICE DAILY.  . [DISCONTINUED] levothyroxine (SYNTHROID, LEVOTHROID) 88 MCG tablet Take 88 mcg by mouth daily before breakfast.  . guaiFENesin (MUCINEX) 600 MG 12 hr tablet Take 1,200 mg by mouth as needed. Reported on 11/28/2015   No facility-administered encounter medications on file as of 11/28/2015.   ALLERGIES: Allergies  Allergen Reactions  . Penicillins Rash and Other (See Comments)    Unknown    VACCINATION STATUS:  There is no immunization history on file for this patient.  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 she is on LT4 88 mcg po qam, reports compliance to this medication. she continued to feel better, she denies palpitation, heat intolerance.  denies cold/heat intolerance.    Review of Systems Constitutional: no weight gain/loss, no fatigue, no subjective hyperthermia/hypothermia Eyes: no blurry vision, no xerophthalmia ENT: no sore throat, no nodules palpated in throat, no dysphagia/odynophagia, no hoarseness Cardiovascular: no CP/SOB/palpitations/leg swelling Respiratory: no cough/SOB Gastrointestinal: no N/V/D/C Musculoskeletal: no muscle/joint aches Skin: no rashes Neurological: no tremors/numbness/tingling/dizziness Psychiatric: no depression/anxiety  Objective:    BP 159/84 mmHg  Pulse 64  Ht 5\' 5"  (1.651 m)  Wt 155 lb (70.308 kg)  BMI 25.79 kg/m2  SpO2 98%  Wt Readings from Last 3 Encounters:  11/28/15 155 lb (70.308 kg)  05/10/15 167 lb (75.751 kg)  05/31/14 159 lb (72.122 kg)    Physical Exam  Constitutional: in NAD Eyes: PERRLA, EOMI, no exophthalmos ENT: moist mucous membranes, no thyromegaly, no cervical lymphadenopathy Cardiovascular: RRR, No MRG Respiratory: CTA B Gastrointestinal: abdomen soft, NT, ND, BS+ Musculoskeletal: no deformities, strength intact in all 4 Skin: moist, warm, no  rashes Neurological: no tremor with outstretched hands, DTR normal in all 4  CMP     Component Value Date/Time   NA 133* 09/06/2013 1315   K 4.3 09/06/2013 1315   CL 94* 09/06/2013 1315   CO2 27 09/06/2013 1315   GLUCOSE 100* 09/06/2013 1315   BUN 24* 09/06/2013 1315   CREATININE 0.98 09/06/2013 1315   CALCIUM 10.3 09/06/2013 1315   GFRNONAA 55* 09/06/2013 1315   GFRAA 63* 09/06/2013 1315   On 11/22/2015 her TSH was 2.34 and free T4 1. 0.   Assessment & Plan:   1. RAI induced Hypothyroidism - - she would benefit from a slight increase in her levothyroxine. I would increase it to 100 g by mouth every morning.  - We discussed about correct intake of levothyroxine, at fasting, with water, separated by at least 30 minutes from breakfast, and separated by more than 4 hours from calcium, iron, multivitamins, acid reflux medications (PPIs). -Patient is made aware of the fact that thyroid hormone replacement  is needed for life, dose to be adjusted by periodic monitoring of thyroid function tests.   For her HTN, which is well controlled, she is on HCTZ-Lisinopril to 25/20 mg po qday, Amlodipine 10mg  po qam and advised her to continue f/u with Dr. Luan Pulling.  For HPL , continue Atorvastatin 20mg  po qhs.    - I advised patient to maintain close follow up with HAWKINS,EDWARD L, MD for primary care needs. Follow up plan: Return in about 6 months (around 05/27/2016) for underactive thyroid, follow up with pre-visit labs.  Glade Lloyd, MD Phone: [phone]  Fax: [phone]   11/28/2015, 11:16 AM

## 2015-12-02 ENCOUNTER — Ambulatory Visit (HOSPITAL_COMMUNITY): Payer: Self-pay | Admitting: Psychology

## 2015-12-06 ENCOUNTER — Ambulatory Visit (INDEPENDENT_AMBULATORY_CARE_PROVIDER_SITE_OTHER): Payer: Medicare Other | Admitting: Psychology

## 2015-12-06 DIAGNOSIS — F411 Generalized anxiety disorder: Secondary | ICD-10-CM

## 2015-12-06 DIAGNOSIS — F410 Panic disorder [episodic paroxysmal anxiety] without agoraphobia: Secondary | ICD-10-CM | POA: Diagnosis not present

## 2015-12-12 DIAGNOSIS — I2510 Atherosclerotic heart disease of native coronary artery without angina pectoris: Secondary | ICD-10-CM | POA: Diagnosis not present

## 2015-12-12 DIAGNOSIS — M159 Polyosteoarthritis, unspecified: Secondary | ICD-10-CM | POA: Diagnosis not present

## 2015-12-12 DIAGNOSIS — J449 Chronic obstructive pulmonary disease, unspecified: Secondary | ICD-10-CM | POA: Diagnosis not present

## 2015-12-12 DIAGNOSIS — F419 Anxiety disorder, unspecified: Secondary | ICD-10-CM | POA: Diagnosis not present

## 2016-01-07 ENCOUNTER — Telehealth (HOSPITAL_COMMUNITY): Payer: Self-pay | Admitting: *Deleted

## 2016-01-14 ENCOUNTER — Other Ambulatory Visit: Payer: Self-pay | Admitting: "Endocrinology

## 2016-01-31 ENCOUNTER — Ambulatory Visit (HOSPITAL_COMMUNITY): Payer: Self-pay | Admitting: Psychology

## 2016-02-04 ENCOUNTER — Other Ambulatory Visit: Payer: Self-pay | Admitting: "Endocrinology

## 2016-02-05 MED ORDER — LEVOTHYROXINE SODIUM 100 MCG PO TABS: 100.0000 ug | ORAL_TABLET | Freq: Every day | ORAL | Status: DC

## 2016-02-07 ENCOUNTER — Encounter (HOSPITAL_COMMUNITY): Payer: Self-pay | Admitting: Psychology

## 2016-02-07 ENCOUNTER — Ambulatory Visit (INDEPENDENT_AMBULATORY_CARE_PROVIDER_SITE_OTHER): Payer: Medicare Other | Admitting: Psychology

## 2016-02-07 DIAGNOSIS — F410 Panic disorder [episodic paroxysmal anxiety] without agoraphobia: Secondary | ICD-10-CM

## 2016-02-07 DIAGNOSIS — F411 Generalized anxiety disorder: Secondary | ICD-10-CM | POA: Diagnosis not present
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
## 2016-02-14 ENCOUNTER — Other Ambulatory Visit: Payer: Self-pay | Admitting: "Endocrinology

## 2016-03-09 ENCOUNTER — Encounter (HOSPITAL_COMMUNITY): Payer: Self-pay | Admitting: Psychology

## 2016-03-09 ENCOUNTER — Ambulatory Visit (INDEPENDENT_AMBULATORY_CARE_PROVIDER_SITE_OTHER): Payer: Medicare Other | Admitting: Psychology

## 2016-03-09 DIAGNOSIS — F411 Generalized anxiety disorder: Secondary | ICD-10-CM | POA: Diagnosis not present

## 2016-03-09 DIAGNOSIS — F410 Panic disorder [episodic paroxysmal anxiety] without agoraphobia: Secondary | ICD-10-CM | POA: Diagnosis not present
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
## 2016-03-12 DIAGNOSIS — I2510 Atherosclerotic heart disease of native coronary artery without angina pectoris: Secondary | ICD-10-CM | POA: Diagnosis not present

## 2016-03-12 DIAGNOSIS — G3184 Mild cognitive impairment, so stated: Secondary | ICD-10-CM | POA: Diagnosis not present

## 2016-03-12 DIAGNOSIS — F419 Anxiety disorder, unspecified: Secondary | ICD-10-CM | POA: Diagnosis not present

## 2016-03-12 DIAGNOSIS — J449 Chronic obstructive pulmonary disease, unspecified: Secondary | ICD-10-CM | POA: Diagnosis not present

## 2016-04-04 ENCOUNTER — Other Ambulatory Visit: Payer: Self-pay | Admitting: "Endocrinology

## 2016-04-08 ENCOUNTER — Encounter (HOSPITAL_COMMUNITY): Payer: Self-pay | Admitting: Psychology

## 2016-04-08 ENCOUNTER — Ambulatory Visit (INDEPENDENT_AMBULATORY_CARE_PROVIDER_SITE_OTHER): Payer: Medicare Other | Admitting: Psychology

## 2016-04-08 DIAGNOSIS — F411 Generalized anxiety disorder: Secondary | ICD-10-CM

## 2016-04-08 DIAGNOSIS — F410 Panic disorder [episodic paroxysmal anxiety] without agoraphobia: Secondary | ICD-10-CM
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
## 2016-04-13 ENCOUNTER — Other Ambulatory Visit: Payer: Self-pay | Admitting: "Endocrinology

## 2016-05-08 ENCOUNTER — Ambulatory Visit (INDEPENDENT_AMBULATORY_CARE_PROVIDER_SITE_OTHER): Payer: Medicare Other | Admitting: Psychology

## 2016-05-08 DIAGNOSIS — F411 Generalized anxiety disorder: Secondary | ICD-10-CM

## 2016-05-08 DIAGNOSIS — F410 Panic disorder [episodic paroxysmal anxiety] without agoraphobia: Secondary | ICD-10-CM | POA: Diagnosis not present

## 2016-05-18 ENCOUNTER — Other Ambulatory Visit: Payer: Self-pay | Admitting: Gastroenterology

## 2016-05-19 NOTE — Telephone Encounter (Signed)
Please call patient and tell her I will send in a refill for 1 month. However, we haven't seen her since 2013. She can either obtain Miralax over the counter, call her PCP to manage this Rx, or schedule a follow-up appointment so that we can continue to manage it.

## 2016-05-22 ENCOUNTER — Other Ambulatory Visit: Payer: Self-pay | Admitting: "Endocrinology

## 2016-05-22 DIAGNOSIS — E032 Hypothyroidism due to medicaments and other exogenous substances: Secondary | ICD-10-CM | POA: Diagnosis not present

## 2016-05-22 DIAGNOSIS — E039 Hypothyroidism, unspecified: Secondary | ICD-10-CM

## 2016-05-22 LAB — T4, FREE: Free T4: 1.3 ng/dL (ref 0.8–1.8)

## 2016-05-22 LAB — TSH: TSH: 0.57 mIU/L

## 2016-05-25 NOTE — Telephone Encounter (Signed)
LMOM that one refill was given. She must make an appt for further refills or see PCP for it.

## 2016-05-25 NOTE — Telephone Encounter (Signed)
Daughter is aware that she will need an appointment

## 2016-05-29 ENCOUNTER — Ambulatory Visit (INDEPENDENT_AMBULATORY_CARE_PROVIDER_SITE_OTHER): Payer: Medicare Other | Admitting: "Endocrinology

## 2016-05-29 ENCOUNTER — Encounter: Payer: Self-pay | Admitting: "Endocrinology

## 2016-05-29 VITALS — BP 139/82 | HR 79 | Ht 63.0 in | Wt 149.0 lb

## 2016-05-29 DIAGNOSIS — E890 Postprocedural hypothyroidism: Secondary | ICD-10-CM | POA: Diagnosis not present

## 2016-05-29 MED ORDER — LEVOTHYROXINE SODIUM 100 MCG PO TABS: 100.0000 ug | ORAL_TABLET | Freq: Every day | ORAL | Status: DC

## 2016-05-29 NOTE — Progress Notes (Signed)
Subjective:    Patient ID: Lauren [name], female    DOB: 02-11-1934, PCP Alonza Bogus, MD   Past Medical History  Diagnosis Date  . Asthma   . Osteoarthritis   . Essential hypertension, benign   . Hyperlipidemia   . Back pain   . GERD (gastroesophageal reflux disease)   . History of cardiac catheterization     Reportedly normal coronaries in 2000  . COPD (chronic obstructive pulmonary disease) (HCC)     O2 at night sometimes   Past Surgical History  Procedure Laterality Date  . Shoulder surgery      Rotator Cuff  . Ankle surgery      Rod and 13 screws  . Rotator cuff repair    . Appendectomy    . Breast biopsy  1996    Benign  . Abdominal hysterectomy  1994  . Colonoscopy  01/27/2011    Normal rectum/Sigmoid diverticula/Multiple colonic polyps removed (TUBULAR ADENOMA) Next TCS 01/2016  . Esophagogastroduodenoscopy  08/21/2003       Normal tubular esophagus status post passage of Maloney dilator as  described above/  2. Multiple linear antral erosions of uncertain significance, could be NSAID  . Esophagogastroduodenoscopy  08/04/2012    Procedure: ESOPHAGOGASTRODUODENOSCOPY (EGD);  Surgeon: Daneil Dolin, MD;  Location: [facility];  Service: Endoscopy;  Laterality: N/A;  7:30  . Esophageal dilation  08/04/2012    Procedure: ESOPHAGEAL DILATION;  Surgeon: Daneil Dolin, MD;  Location: [facility];  Service: Endoscopy;;  . Dorsal compartment release Left 02/03/2013    Procedure: RELEASE DORSAL COMPARTMENT (DEQUERVAIN);  Surgeon: Carole Civil, MD;  Location: [facility];  Service: Orthopedics;  Laterality: Left;  . Trigger finger release Right 09/11/2013    Procedure: RIGHT LONG FINGER RELEASE TRIGGER FINGER;  Surgeon: Carole Civil, MD;  Location: [facility];  Service: Orthopedics;  Laterality: Right;  . Dorsal compartment release Right 09/11/2013    Procedure: RELEASE DORSAL COMPARTMENT (DEQUERVAIN);  Surgeon: Carole Civil, MD;  Location: [facility];   Service: Orthopedics;  Laterality: Right;   Social History   Social History  . Marital Status: Divorced    Spouse Name: N/A  . Number of Children: 4  . Years of Education: N/A   Occupational History  .     Social History Main Topics  . Smoking status: Former Smoker -- 0.50 packs/day for 25 years    Types: Cigarettes    Quit date: 01/31/1963  . Smokeless tobacco: Never Used  . Alcohol Use: No  . Drug Use: No  . Sexual Activity: Yes    Birth Control/ Protection: Surgical   Other Topics Concern  . None   Social History Narrative   Outpatient Encounter Prescriptions as of 05/29/2016  Medication Sig  . albuterol (PROVENTIL) (2.5 MG/3ML) 0.083% nebulizer solution Take 2.5 mg by nebulization every 6 (six) hours as needed for wheezing or shortness of breath.  . ALPRAZolam (XANAX) 0.25 MG tablet Take 0.25 mg by mouth 3 (three) times daily as needed for anxiety.   Marland Kitchen amLODipine (NORVASC) 10 MG tablet TAKE (1) TABLET BY MOUTH EACH MORNING FOR BLOOD PRESSURE.  Marland Kitchen aspirin 81 MG tablet Take 81 mg by mouth daily.  . Calcium Carbonate-Vitamin D (CALCIUM + D PO) Take 1 tablet by mouth daily.   Marland Kitchen escitalopram (LEXAPRO) 20 MG tablet Take 20 mg by mouth daily.  Marland Kitchen levothyroxine (SYNTHROID, LEVOTHROID) 100 MCG tablet Take 1 tablet (100 mcg total) by mouth daily  before breakfast.  . losartan-hydrochlorothiazide (HYZAAR) 100-12.5 MG per tablet Take 1 tablet by mouth daily.  . Multiple Vitamin (MULTIVITAMIN WITH MINERALS) TABS tablet Take 1 tablet by mouth daily.  . nitroGLYCERIN (NITROSTAT) 0.4 MG SL tablet Place 0.4 mg under the tongue every 5 (five) minutes as needed for chest pain.  Marland Kitchen oxybutynin (DITROPAN-XL) 10 MG 24 hr tablet Take 10 mg by mouth daily as needed.   . polyethylene glycol powder (GLYCOLAX/MIRALAX) powder MIX 1 CAPFUL (17 GRAMS) WITH 8OZ OF WATER OR JUICE DAILY.  . [DISCONTINUED] atorvastatin (LIPITOR) 20 MG tablet TAKE ONE TABLET BY MOUTH DAILY FOR CHOLESTEROL.  . [DISCONTINUED]  diclofenac sodium (VOLTAREN) 1 % GEL Apply 4 g topically 4 (four) times daily.  . [DISCONTINUED] guaiFENesin (MUCINEX) 600 MG 12 hr tablet Take 1,200 mg by mouth as needed. Reported on 11/28/2015  . [DISCONTINUED] levothyroxine (SYNTHROID, LEVOTHROID) 100 MCG tablet Take 1 tablet (100 mcg total) by mouth daily before breakfast.  . [DISCONTINUED] levothyroxine (SYNTHROID, LEVOTHROID) 100 MCG tablet Take 1 tablet (100 mcg total) by mouth daily before breakfast.  . [DISCONTINUED] levothyroxine (SYNTHROID, LEVOTHROID) 100 MCG tablet Take 1 tablet (100 mcg total) by mouth daily.  . [DISCONTINUED] levothyroxine (SYNTHROID, LEVOTHROID) 100 MCG tablet TAKE ONE TABLET BY MOUTH DAILY.  . [DISCONTINUED] polyethylene glycol (MIRALAX / GLYCOLAX) packet Take 17 g by mouth daily as needed.    No facility-administered encounter medications on file as of 05/29/2016.   ALLERGIES: Allergies  Allergen Reactions  . Penicillins Rash and Other (See Comments)    Unknown    VACCINATION STATUS:  There is no immunization history on file for this patient.  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 She is on LT4 100 mcg po qam, reports compliance to this medication. she continued to feel better, she denies palpitation, heat intolerance.  denies cold/heat intolerance.    Review of Systems Constitutional: +weight loss, no fatigue, no subjective hyperthermia/hypothermia Eyes: no blurry vision, no xerophthalmia ENT: no sore throat, no nodules palpated in throat, no dysphagia/odynophagia, no hoarseness Cardiovascular: no CP/SOB/palpitations/leg swelling Respiratory: no cough/SOB Gastrointestinal: no N/V/D/C Musculoskeletal: no muscle/joint aches Skin: no rashes Neurological: no tremors/numbness/tingling/dizziness Psychiatric: no depression/anxiety  Objective:    BP 139/82 mmHg  Pulse 79  Ht 5\' 3"  (1.6 m)  Wt 149 lb (67.586 kg)  BMI 26.40 kg/m2  Wt Readings from Last 3 Encounters:   05/29/16 149 lb (67.586 kg)  11/28/15 155 lb (70.308 kg)  05/10/15 167 lb (75.751 kg)    Physical Exam  Constitutional: in NAD Eyes: PERRLA, EOMI, no exophthalmos ENT: moist mucous membranes, no thyromegaly, no cervical lymphadenopathy Cardiovascular: RRR, No MRG Respiratory: CTA B Gastrointestinal: abdomen soft, NT, ND, BS+ Musculoskeletal: no deformities, strength intact in all 4 Skin: moist, warm, no rashes Neurological: no tremor with outstretched hands, DTR normal in all 4  CMP     Component Value Date/Time   NA 133* 09/06/2013 1315   K 4.3 09/06/2013 1315   CL 94* 09/06/2013 1315   CO2 27 09/06/2013 1315   GLUCOSE 100* 09/06/2013 1315   BUN 24* 09/06/2013 1315   CREATININE 0.98 09/06/2013 1315   CALCIUM 10.3 09/06/2013 1315   GFRNONAA 55* 09/06/2013 1315   GFRAA 63* 09/06/2013 1315   On 11/22/2015 her TSH was 2.34 and free T4 1. 0.   Assessment & Plan:   1. RAI induced Hypothyroidism - -  Her thyroid function tests are consistent with appropriate replacement. I will  continue levothyroxine 100 g by mouth every morning.    - We discussed about correct intake of levothyroxine, at fasting, with water, separated by at least 30 minutes from breakfast, and separated by more than 4 hours from calcium, iron, multivitamins, acid reflux medications (PPIs). -Patient is made aware of the fact that thyroid hormone replacement is needed for life, dose to be adjusted by periodic monitoring of thyroid function tests.   For her HTN, which is well controlled, she is on HCTZ-Lisinopril to 25/20 mg po qday, Amlodipine 10mg  po qam and advised her to continue f/u with Dr. Luan Pulling.  For HPL , continue Atorvastatin 20mg  po qhs.    - I advised patient to maintain close follow up with HAWKINS,EDWARD L, MD for primary care needs. Follow up plan: Return in about 6 months (around 11/29/2016) for follow up with pre-visit labs.  Glade Lloyd, MD Phone: [phone]  Fax: [phone]    05/29/2016, 10:34 AM

## 2016-06-09 IMAGING — NM NM MYOCAR MULTI W/SPECT W/WALL MOTION & EF
2 series · 12 of 12 positions shown · non-contrast
Comparison: none

CLINICAL DATA: 79-year-old woman wiith history of hypertension,
hyperlipidemia, GERD, COPD, now presenting with symptoms of chest
pain for the evaluation of ischemia.

EXAM:
MYOCARDIAL IMAGING WITH SPECT (REST AND PHARMACOLOGIC-STRESS)
GATED LEFT VENTRICULAR WALL MOTION STUDY
LEFT VENTRICULAR EJECTION FRACTION
TECHNIQUE: Standard myocardial SPECT imaging was performed after resting
intravenous injection of 10 mCi Sc-GGm sestamibi. Subsequently,
intravenous infusion of Lexiscan was performed under the supervision
of the Cardiology staff. At peak effect of the drug, 30 mCi Sc-GGm
sestamibi was injected intravenously and standard myocardial SPECT
imaging was performed. Quantitative gated imaging was also performed
to evaluate left ventricular wall motion, and estimate left
ventricular ejection fraction.

[Series 1: rest · 8.28mm/px · 6 of 64 frames shown]
[frame 6/64]
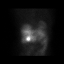
[frame 16/64]
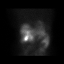
[frame 27/64]
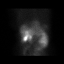
[frame 38/64]
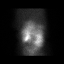
[frame 48/64]
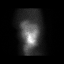
[frame 59/64]
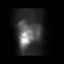

[Series 2: stress gated · 8.28mm/px · 6 of 64 frames shown]
[frame 6/64]
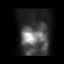
[frame 16/64]
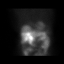
[frame 27/64]
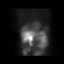
[frame 38/64]
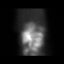
[frame 48/64]
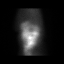
[frame 59/64]
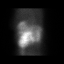

[12 of 12 positions shown; findings below may reference images not displayed]

FINDINGS: Baseline tracing shows sinus bradycardia with decreased anterior R
wave progression. Lexiscan bolus was given in standard fashion.
Heart rate increased from 49 beats per min up to 93 beats per min,
and blood pressure increased from 130/68 up to 138/67. Non limiting
chest pain was described. There were no clearly diagnostic ST
segment changes by standard criteria. Occasional the frequent PVCs
noted, no sustained arrhythmia.

Analysis of the overall perfusion data shows adequate radiotracer
uptake within the myocardium.

Tomographic views were obtained using the short axis, vertical long
axis, and horizontal long axis planes. No significant, reversible
perfusion defects are noted, with some interference from adjacent
gut activity in the region of the inferior septal wall.

Gated imaging reveals an EDV of 77, ESV of 46, LVEF of 40%, and the
TID ratio of 0.99. Visually, LVEF appears to be normal.
IMPRESSION: Overall low risk Lexiscan Cardiolite. There were no diagnostic ST
segment abnormalities. Perfusion imaging shows no clear evidence of
scar or ischemia in the setting of some interference from
radiotracer uptake in the gut near junction with the inferior septal
wall. Although LVEF is calculated at 40%, visually it appears
normal. LV volumes and wall motion are also normal. Consider
confirmatory echocardiogram for assessment of LVEF.

## 2016-06-11 DIAGNOSIS — I2510 Atherosclerotic heart disease of native coronary artery without angina pectoris: Secondary | ICD-10-CM | POA: Diagnosis not present

## 2016-06-11 DIAGNOSIS — M159 Polyosteoarthritis, unspecified: Secondary | ICD-10-CM | POA: Diagnosis not present

## 2016-06-11 DIAGNOSIS — I10 Essential (primary) hypertension: Secondary | ICD-10-CM | POA: Diagnosis not present

## 2016-06-11 DIAGNOSIS — J449 Chronic obstructive pulmonary disease, unspecified: Secondary | ICD-10-CM | POA: Diagnosis not present

## 2016-09-14 DIAGNOSIS — Z23 Encounter for immunization: Secondary | ICD-10-CM | POA: Diagnosis not present
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
## 2016-10-12 DIAGNOSIS — I10 Essential (primary) hypertension: Secondary | ICD-10-CM | POA: Diagnosis not present

## 2016-10-12 DIAGNOSIS — I2510 Atherosclerotic heart disease of native coronary artery without angina pectoris: Secondary | ICD-10-CM | POA: Diagnosis not present

## 2016-10-12 DIAGNOSIS — J449 Chronic obstructive pulmonary disease, unspecified: Secondary | ICD-10-CM | POA: Diagnosis not present

## 2016-10-12 DIAGNOSIS — F0390 Unspecified dementia without behavioral disturbance: Secondary | ICD-10-CM | POA: Diagnosis not present

## 2016-11-04 ENCOUNTER — Other Ambulatory Visit: Payer: Self-pay | Admitting: "Endocrinology

## 2016-11-13 DIAGNOSIS — F0390 Unspecified dementia without behavioral disturbance: Secondary | ICD-10-CM | POA: Diagnosis not present

## 2016-11-13 DIAGNOSIS — R5383 Other fatigue: Secondary | ICD-10-CM | POA: Diagnosis not present

## 2016-11-13 DIAGNOSIS — E039 Hypothyroidism, unspecified: Secondary | ICD-10-CM | POA: Diagnosis not present

## 2016-11-13 DIAGNOSIS — I2510 Atherosclerotic heart disease of native coronary artery without angina pectoris: Secondary | ICD-10-CM | POA: Diagnosis not present

## 2016-11-26 ENCOUNTER — Other Ambulatory Visit: Payer: Self-pay | Admitting: "Endocrinology

## 2016-11-26 DIAGNOSIS — E890 Postprocedural hypothyroidism: Secondary | ICD-10-CM | POA: Diagnosis not present

## 2016-11-26 DIAGNOSIS — F0390 Unspecified dementia without behavioral disturbance: Secondary | ICD-10-CM | POA: Diagnosis not present

## 2016-11-26 DIAGNOSIS — E039 Hypothyroidism, unspecified: Secondary | ICD-10-CM | POA: Diagnosis not present

## 2016-11-26 DIAGNOSIS — R5383 Other fatigue: Secondary | ICD-10-CM | POA: Diagnosis not present

## 2016-11-26 DIAGNOSIS — I2510 Atherosclerotic heart disease of native coronary artery without angina pectoris: Secondary | ICD-10-CM | POA: Diagnosis not present

## 2016-11-26 LAB — TSH: TSH: 41.9 mIU/L — ABNORMAL HIGH

## 2016-11-26 LAB — T4, FREE: Free T4: 0.8 ng/dL (ref 0.8–1.8)

## 2016-11-30 ENCOUNTER — Ambulatory Visit (INDEPENDENT_AMBULATORY_CARE_PROVIDER_SITE_OTHER): Payer: Medicare Other | Admitting: "Endocrinology

## 2016-11-30 ENCOUNTER — Encounter: Payer: Self-pay | Admitting: "Endocrinology

## 2016-11-30 VITALS — BP 164/81 | HR 57 | Ht 65.0 in | Wt 155.0 lb

## 2016-11-30 DIAGNOSIS — I10 Essential (primary) hypertension: Secondary | ICD-10-CM

## 2016-11-30 DIAGNOSIS — E890 Postprocedural hypothyroidism: Secondary | ICD-10-CM | POA: Diagnosis not present

## 2016-11-30 MED ORDER — LEVOTHYROXINE SODIUM 112 MCG PO TABS: 112.0000 ug | ORAL_TABLET | Freq: Every day | ORAL | 3 refills | Status: DC

## 2016-11-30 NOTE — Progress Notes (Signed)
Subjective:    Patient ID: Lauren [name], female    DOB: 1933/12/14, PCP Alonza Bogus, MD   Past Medical History:  Diagnosis Date  . Asthma   . Back pain   . COPD (chronic obstructive pulmonary disease) (HCC)    O2 at night sometimes  . Essential hypertension, benign   . GERD (gastroesophageal reflux disease)   . History of cardiac catheterization    Reportedly normal coronaries in 2000  . Hyperlipidemia   . Osteoarthritis    Past Surgical History:  Procedure Laterality Date  . ABDOMINAL HYSTERECTOMY  1994  . ANKLE SURGERY     Rod and 13 screws  . APPENDECTOMY    . BREAST BIOPSY  1996   Benign  . COLONOSCOPY  01/27/2011   Normal rectum/Sigmoid diverticula/Multiple colonic polyps removed (TUBULAR ADENOMA) Next TCS 01/2016  . DORSAL COMPARTMENT RELEASE Left 02/03/2013   Procedure: RELEASE DORSAL COMPARTMENT (DEQUERVAIN);  Surgeon: Carole Civil, MD;  Location: [facility];  Service: Orthopedics;  Laterality: Left;  . DORSAL COMPARTMENT RELEASE Right 09/11/2013   Procedure: RELEASE DORSAL COMPARTMENT (DEQUERVAIN);  Surgeon: Carole Civil, MD;  Location: [facility];  Service: Orthopedics;  Laterality: Right;  . ESOPHAGEAL DILATION  08/04/2012   Procedure: ESOPHAGEAL DILATION;  Surgeon: Daneil Dolin, MD;  Location: [facility];  Service: Endoscopy;;  . ESOPHAGOGASTRODUODENOSCOPY  08/21/2003      Normal tubular esophagus status post passage of Maloney dilator as  described above/  2. Multiple linear antral erosions of uncertain significance, could be NSAID  . ESOPHAGOGASTRODUODENOSCOPY  08/04/2012   Procedure: ESOPHAGOGASTRODUODENOSCOPY (EGD);  Surgeon: Daneil Dolin, MD;  Location: [facility];  Service: Endoscopy;  Laterality: N/A;  7:30  . ROTATOR CUFF REPAIR    . SHOULDER SURGERY     Rotator Cuff  . TRIGGER FINGER RELEASE Right 09/11/2013   Procedure: RIGHT LONG FINGER RELEASE TRIGGER FINGER;  Surgeon: Carole Civil, MD;  Location: [facility];  Service:  Orthopedics;  Laterality: Right;   Social History   Social History  . Marital status: Divorced    Spouse name: N/A  . Number of children: 4  . Years of education: N/A   Occupational History  .  Retired   Social History Main Topics  . Smoking status: Former Smoker    Packs/day: 0.50    Years: 25.00    Types: Cigarettes    Quit date: 01/31/1963  . Smokeless tobacco: Never Used  . Alcohol use No  . Drug use: No  . Sexual activity: Yes    Birth control/ protection: Surgical   Other Topics Concern  . None   Social History Narrative  . None   Outpatient Encounter Prescriptions as of 11/30/2016  Medication Sig  . albuterol (PROVENTIL) (2.5 MG/3ML) 0.083% nebulizer solution Take 2.5 mg by nebulization every 6 (six) hours as needed for wheezing or shortness of breath.  . ALPRAZolam (XANAX) 0.25 MG tablet Take 0.25 mg by mouth 3 (three) times daily as needed for anxiety.   Marland Kitchen amLODipine (NORVASC) 10 MG tablet TAKE (1) TABLET BY MOUTH EACH MORNING FOR BLOOD PRESSURE.  Marland Kitchen aspirin 81 MG tablet Take 81 mg by mouth daily.  . Calcium Carbonate-Vitamin D (CALCIUM + D PO) Take 1 tablet by mouth daily.   Marland Kitchen escitalopram (LEXAPRO) 20 MG tablet Take 20 mg by mouth daily.  Marland Kitchen levothyroxine (SYNTHROID, LEVOTHROID) 100 MCG tablet Take 1 tablet (100 mcg total) by mouth daily before breakfast.  . losartan-hydrochlorothiazide (  HYZAAR) 100-12.5 MG per tablet Take 1 tablet by mouth daily.  . Multiple Vitamin (MULTIVITAMIN WITH MINERALS) TABS tablet Take 1 tablet by mouth daily.  . nitroGLYCERIN (NITROSTAT) 0.4 MG SL tablet Place 0.4 mg under the tongue every 5 (five) minutes as needed for chest pain.  Marland Kitchen oxybutynin (DITROPAN-XL) 10 MG 24 hr tablet Take 10 mg by mouth daily as needed.   . polyethylene glycol powder (GLYCOLAX/MIRALAX) powder MIX 1 CAPFUL (17 GRAMS) WITH 8OZ OF WATER OR JUICE DAILY.   No facility-administered encounter medications on file as of 11/30/2016.    ALLERGIES: Allergies  Allergen  Reactions  . Penicillins Rash and Other (See Comments)    Unknown    VACCINATION STATUS:  There is no immunization history on file for this patient.  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 She is on LT4 100 mcg po qam. - She is accompanied by her other grown daughter who reports consistency problems with her thyroid hormone intake. This is due to the fact that she forgets her medications quite often. She has gained 6 pounds since last visit. She complains of cold intolerance and fatigue.   Review of Systems Constitutional: +weight gain, + fatigue,  + subjective hypothermia Eyes: no blurry vision, no xerophthalmia ENT: no sore throat, no nodules palpated in throat, no dysphagia/odynophagia, no hoarseness Cardiovascular: no CP/SOB/palpitations/leg swelling Respiratory: no cough/SOB Gastrointestinal: no N/V/D/C Musculoskeletal: no muscle/joint aches Skin: no rashes Neurological: no tremors/numbness/tingling/dizziness Psychiatric: no depression/anxiety  Objective:    BP (!) 164/81   Pulse (!) 57   Ht 5\' 5"  (1.651 m)   Wt 155 lb (70.3 kg)   BMI 25.79 kg/m   Wt Readings from Last 3 Encounters:  11/30/16 155 lb (70.3 kg)  05/29/16 149 lb (67.6 kg)  11/28/15 155 lb (70.3 kg)    Physical Exam  Constitutional: in NAD Eyes: PERRLA, EOMI, no exophthalmos ENT: moist mucous membranes, no thyromegaly, no cervical lymphadenopathy Cardiovascular: RRR, No MRG Respiratory: CTA B Gastrointestinal: abdomen soft, NT, ND, BS+ Musculoskeletal: no deformities, strength intact in all 4 Skin: moist, warm, no rashes Neurological: no tremor with outstretched hands, DTR normal in all 4  Recent Results (from the past 2160 hour(s))  TSH     Status: Abnormal   Collection Time: 11/26/16 10:21 AM  Result Value Ref Range   TSH 41.90 (H) mIU/L    Comment:   Reference Range   > or = 20 Years  0.40-4.50   Pregnancy Range First trimester  0.26-2.66 Second trimester  0.55-2.73 Third trimester  0.43-2.91     T4, free     Status: None   Collection Time: 11/26/16 10:21 AM  Result Value Ref Range   Free T4 0.8 0.8 - 1.8 ng/dL      Assessment & Plan:   1. RAI induced Hypothyroidism - -  Her thyroid function tests are consistent with Inadequate replacement. However this is due to the fact that she misses her thyroid hormone doses. Her daughter is offering to help to remind her to take her thyroid hormone daily. I'll proceed to increase her levothyroxine to 112 g by mouth every morning.  - We discussed about correct intake of levothyroxine, at fasting, with water, separated by at least 30 minutes from breakfast, and separated by more than 4 hours from calcium, iron, multivitamins, acid reflux medications (PPIs). -Patient is made aware of the fact that thyroid hormone replacement is needed for life, dose to be adjusted  by periodic monitoring of thyroid function tests.   For her HTN, which is not controlled, she is supposed to be  HCTZ-Lisinopril to 25/20 mg po qday, Amlodipine 10mg  po qam and advised her to continue f/u with Dr. Luan Pulling.  For HPL , continue Atorvastatin 20mg  po qhs.    - I advised patient to maintain close follow up with HAWKINS,EDWARD L, MD for primary care needs. Follow up plan: Return in about 3 months (around 02/28/2017) for follow up with pre-visit labs.  Glade Lloyd, MD Phone: [phone]  Fax: [phone]   11/30/2016, 8:47 AM

## 2017-01-23 ENCOUNTER — Other Ambulatory Visit: Payer: Self-pay | Admitting: "Endocrinology

## 2017-02-12 DIAGNOSIS — I10 Essential (primary) hypertension: Secondary | ICD-10-CM | POA: Diagnosis not present

## 2017-02-12 DIAGNOSIS — I119 Hypertensive heart disease without heart failure: Secondary | ICD-10-CM | POA: Diagnosis not present

## 2017-02-12 DIAGNOSIS — F0390 Unspecified dementia without behavioral disturbance: Secondary | ICD-10-CM | POA: Diagnosis not present

## 2017-02-12 DIAGNOSIS — J449 Chronic obstructive pulmonary disease, unspecified: Secondary | ICD-10-CM | POA: Diagnosis not present

## 2017-02-17 ENCOUNTER — Other Ambulatory Visit: Payer: Self-pay | Admitting: "Endocrinology

## 2017-02-25 ENCOUNTER — Other Ambulatory Visit: Payer: Self-pay | Admitting: "Endocrinology

## 2017-02-25 DIAGNOSIS — E890 Postprocedural hypothyroidism: Secondary | ICD-10-CM | POA: Diagnosis not present

## 2017-02-25 LAB — T4, FREE: Free T4: 1.5 ng/dL (ref 0.8–1.8)

## 2017-02-25 LAB — TSH: TSH: 0.11 mIU/L — ABNORMAL LOW

## 2017-03-01 ENCOUNTER — Ambulatory Visit (INDEPENDENT_AMBULATORY_CARE_PROVIDER_SITE_OTHER): Payer: Medicare Other | Admitting: "Endocrinology

## 2017-03-01 ENCOUNTER — Encounter: Payer: Self-pay | Admitting: "Endocrinology

## 2017-03-01 VITALS — BP 135/80 | HR 63 | Ht 65.0 in | Wt 145.0 lb

## 2017-03-01 DIAGNOSIS — E890 Postprocedural hypothyroidism: Secondary | ICD-10-CM

## 2017-03-01 NOTE — Progress Notes (Signed)
Subjective:    Patient ID: Lauren [name], female    DOB: Jul 10, 1934, PCP Alonza Bogus, MD   Past Medical History:  Diagnosis Date  . Asthma   . Back pain   . COPD (chronic obstructive pulmonary disease) (HCC)    O2 at night sometimes  . Essential hypertension, benign   . GERD (gastroesophageal reflux disease)   . History of cardiac catheterization    Reportedly normal coronaries in 2000  . Hyperlipidemia   . Osteoarthritis    Past Surgical History:  Procedure Laterality Date  . ABDOMINAL HYSTERECTOMY  1994  . ANKLE SURGERY     Rod and 13 screws  . APPENDECTOMY    . BREAST BIOPSY  1996   Benign  . COLONOSCOPY  01/27/2011   Normal rectum/Sigmoid diverticula/Multiple colonic polyps removed (TUBULAR ADENOMA) Next TCS 01/2016  . DORSAL COMPARTMENT RELEASE Left 02/03/2013   Procedure: RELEASE DORSAL COMPARTMENT (DEQUERVAIN);  Surgeon: Carole Civil, MD;  Location: [facility];  Service: Orthopedics;  Laterality: Left;  . DORSAL COMPARTMENT RELEASE Right 09/11/2013   Procedure: RELEASE DORSAL COMPARTMENT (DEQUERVAIN);  Surgeon: Carole Civil, MD;  Location: [facility];  Service: Orthopedics;  Laterality: Right;  . ESOPHAGEAL DILATION  08/04/2012   Procedure: ESOPHAGEAL DILATION;  Surgeon: Daneil Dolin, MD;  [facility]: [facility];  Service: Endoscopy;;  . ESOPHAGOGASTRODUODENOSCOPY  08/21/2003      Normal tubular esophagus status post passage of Maloney dilator as  described above/  2. Multiple linear antral erosions of uncertain significance, could be NSAID  . ESOPHAGOGASTRODUODENOSCOPY  08/04/2012   Procedure: ESOPHAGOGASTRODUODENOSCOPY (EGD);  Surgeon: Daneil Dolin, MD;  Location: [facility];  Service: Endoscopy;  Laterality: N/A;  7:30  . ROTATOR CUFF REPAIR    . SHOULDER SURGERY     Rotator Cuff  . TRIGGER FINGER RELEASE Right 09/11/2013   Procedure: RIGHT LONG FINGER RELEASE TRIGGER FINGER;  Surgeon: Carole Civil, MD;  Location: [facility];  Service:  Orthopedics;  Laterality: Right;   Social History   Social History  . Marital status: Divorced    Spouse name: N/A  . Number of children: 4  . Years of education: N/A   Occupational History  .  Retired   Social History Main Topics  . Smoking status: Former Smoker    Packs/day: 0.50    Years: 25.00    Types: Cigarettes    Quit date: 01/31/1963  . Smokeless tobacco: Never Used  . Alcohol use No  . Drug use: No  . Sexual activity: Yes    Birth control/ protection: Surgical   Other Topics Concern  . None   Social History Narrative  . None   Outpatient Encounter Prescriptions as of 03/01/2017  Medication Sig  . albuterol (PROVENTIL) (2.5 MG/3ML) 0.083% nebulizer solution Take 2.5 mg by nebulization every 6 (six) hours as needed for wheezing or shortness of breath.  . ALPRAZolam (XANAX) 0.25 MG tablet Take 0.25 mg by mouth 3 (three) times daily as needed for anxiety.   Marland Kitchen amLODipine (NORVASC) 10 MG tablet TAKE (1) TABLET BY MOUTH EACH MORNING FOR BLOOD PRESSURE.  Marland Kitchen aspirin 81 MG tablet Take 81 mg by mouth daily.  . Calcium Carbonate-Vitamin D (CALCIUM + D PO) Take 1 tablet by mouth daily.   Marland Kitchen escitalopram (LEXAPRO) 20 MG tablet Take 20 mg by mouth daily.  Marland Kitchen levothyroxine (SYNTHROID, LEVOTHROID) 112 MCG tablet TAKE ONE TABLET BY MOUTH DAILY BEFORE BREAKFAST.  Marland Kitchen losartan-hydrochlorothiazide (HYZAAR) 100-12.5 MG per  tablet Take 1 tablet by mouth daily.  . Multiple Vitamin (MULTIVITAMIN WITH MINERALS) TABS tablet Take 1 tablet by mouth daily.  . nitroGLYCERIN (NITROSTAT) 0.4 MG SL tablet Place 0.4 mg under the tongue every 5 (five) minutes as needed for chest pain.  Marland Kitchen oxybutynin (DITROPAN-XL) 10 MG 24 hr tablet Take 10 mg by mouth daily as needed.   . polyethylene glycol powder (GLYCOLAX/MIRALAX) powder MIX 1 CAPFUL (17 GRAMS) WITH 8OZ OF WATER OR JUICE DAILY.   No facility-administered encounter medications on file as of 03/01/2017.    ALLERGIES: Allergies  Allergen Reactions  .  Penicillins Rash and Other (See Comments)    Unknown    VACCINATION STATUS:  There is no immunization history on file for this patient.  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 She is on LT4 112 mcg po qam. - She is accompanied by her other grown daughter who reports she is doing well on her doses now. She feels better, no new complaints. She lost 10 lbs since last visit after she has gained 6 pounds  Review of Systems Constitutional: +weight loss, -  fatigue,  - subjective hypothermia Eyes: no blurry vision, no xerophthalmia ENT: no sore throat, no nodules palpated in throat, no dysphagia/odynophagia, no hoarseness Cardiovascular: no CP/SOB/palpitations/leg swelling Respiratory: no cough/SOB Gastrointestinal: no N/V/D/C Musculoskeletal: no muscle/joint aches Skin: no rashes Neurological: no tremors/numbness/tingling/dizziness Psychiatric: no depression/anxiety  Objective:    BP 135/80   Pulse 63   Ht 5\' 5"  (1.651 m)   Wt 145 lb (65.8 kg)   BMI 24.13 kg/m   Wt Readings from Last 3 Encounters:  03/01/17 145 lb (65.8 kg)  11/30/16 155 lb (70.3 kg)  05/29/16 149 lb (67.6 kg)    Physical Exam  Constitutional: in NAD Eyes: PERRLA, EOMI, no exophthalmos ENT: moist mucous membranes, no thyromegaly, no cervical lymphadenopathy Cardiovascular: RRR, No MRG Respiratory: CTA B Gastrointestinal: abdomen soft, NT, ND, BS+ Musculoskeletal: no deformities, strength intact in all 4 Skin: moist, warm, no rashes Neurological: no tremor with outstretched hands, DTR normal in all 4  Recent Results (from the past 2160 hour(s))  TSH     Status: Abnormal   Collection Time: 02/25/17  7:56 AM  Result Value Ref Range   TSH 0.11 (L) mIU/L    Comment:   Reference Range   > or = 20 Years  0.40-4.50   Pregnancy Range First trimester  0.26-2.66 Second trimester 0.55-2.73 Third trimester  0.43-2.91     T4, free     Status: None   Collection Time: 02/25/17  7:56  AM  Result Value Ref Range   Free T4 1.5 0.8 - 1.8 ng/dL      Assessment & Plan:   1. RAI induced Hypothyroidism - -  Her thyroid function tests are consistent with appropriate replacement.  Her daughter is offering to help to remind her to take her thyroid hormone daily. I'll keep her current dose of  levothyroxine at 112 g by mouth every morning.  - We discussed about correct intake of levothyroxine, at fasting, with water, separated by at least 30 minutes from breakfast, and separated by more than 4 hours from calcium, iron, multivitamins, acid reflux medications (PPIs). -Patient is made aware of the fact that thyroid hormone replacement is needed for life, dose to be adjusted by periodic monitoring of thyroid function tests.   - I advised patient to maintain close follow up with HAWKINS,EDWARD L, MD for  primary care needs. Follow up plan: Return in about 6 months (around 08/31/2017) for follow up with pre-visit labs.  Glade Lloyd, MD Phone: [phone]  Fax: [phone]   03/01/2017, 8:29 AM

## 2017-05-22 ENCOUNTER — Other Ambulatory Visit: Payer: Self-pay | Admitting: "Endocrinology

## 2017-06-26 ENCOUNTER — Other Ambulatory Visit: Payer: Self-pay | Admitting: "Endocrinology

## 2017-07-06 DIAGNOSIS — I2510 Atherosclerotic heart disease of native coronary artery without angina pectoris: Secondary | ICD-10-CM | POA: Diagnosis not present

## 2017-07-06 DIAGNOSIS — I10 Essential (primary) hypertension: Secondary | ICD-10-CM | POA: Diagnosis not present

## 2017-07-06 DIAGNOSIS — J441 Chronic obstructive pulmonary disease with (acute) exacerbation: Secondary | ICD-10-CM | POA: Diagnosis not present

## 2017-07-06 DIAGNOSIS — E039 Hypothyroidism, unspecified: Secondary | ICD-10-CM | POA: Diagnosis not present

## 2017-07-19 ENCOUNTER — Other Ambulatory Visit: Payer: Self-pay | Admitting: "Endocrinology

## 2017-07-28 ENCOUNTER — Other Ambulatory Visit: Payer: Self-pay | Admitting: "Endocrinology

## 2017-08-12 DIAGNOSIS — I2510 Atherosclerotic heart disease of native coronary artery without angina pectoris: Secondary | ICD-10-CM | POA: Diagnosis not present

## 2017-08-12 DIAGNOSIS — I10 Essential (primary) hypertension: Secondary | ICD-10-CM | POA: Diagnosis not present

## 2017-08-12 DIAGNOSIS — Z23 Encounter for immunization: Secondary | ICD-10-CM | POA: Diagnosis not present

## 2017-08-12 DIAGNOSIS — F0390 Unspecified dementia without behavioral disturbance: Secondary | ICD-10-CM | POA: Diagnosis not present

## 2017-08-12 DIAGNOSIS — J449 Chronic obstructive pulmonary disease, unspecified: Secondary | ICD-10-CM | POA: Diagnosis not present

## 2017-08-23 DIAGNOSIS — E890 Postprocedural hypothyroidism: Secondary | ICD-10-CM | POA: Diagnosis not present

## 2017-08-23 LAB — TSH: TSH: 0.09 mIU/L — ABNORMAL LOW (ref 0.40–4.50)

## 2017-08-23 LAB — T4, FREE: Free T4: 1.5 ng/dL (ref 0.8–1.8)

## 2017-08-30 ENCOUNTER — Encounter: Payer: Self-pay | Admitting: "Endocrinology

## 2017-08-30 ENCOUNTER — Ambulatory Visit (INDEPENDENT_AMBULATORY_CARE_PROVIDER_SITE_OTHER): Payer: Medicare Other | Admitting: "Endocrinology

## 2017-08-30 VITALS — BP 134/84 | HR 58 | Ht 65.0 in | Wt 145.0 lb

## 2017-08-30 DIAGNOSIS — I10 Essential (primary) hypertension: Secondary | ICD-10-CM

## 2017-08-30 DIAGNOSIS — E890 Postprocedural hypothyroidism: Secondary | ICD-10-CM

## 2017-08-30 MED ORDER — LEVOTHYROXINE SODIUM 112 MCG PO TABS: ORAL_TABLET | ORAL | 1 refills | Status: DC

## 2017-08-30 NOTE — Progress Notes (Signed)
Subjective:    Patient ID: Lauren [name], female    DOB: 07-23-1934, PCP Sinda Du, MD   Past Medical History:  Diagnosis Date  . Asthma   . Back pain   . COPD (chronic obstructive pulmonary disease) (HCC)    O2 at night sometimes  . Essential hypertension, benign   . GERD (gastroesophageal reflux disease)   . History of cardiac catheterization    Reportedly normal coronaries in 2000  . Hyperlipidemia   . Osteoarthritis    Past Surgical History:  Procedure Laterality Date  . ABDOMINAL HYSTERECTOMY  1994  . ANKLE SURGERY     Rod and 13 screws  . APPENDECTOMY    . BREAST BIOPSY  1996   Benign  . COLONOSCOPY  01/27/2011   Normal rectum/Sigmoid diverticula/Multiple colonic polyps removed (TUBULAR ADENOMA) Next TCS 01/2016  . DORSAL COMPARTMENT RELEASE Left 02/03/2013   Procedure: RELEASE DORSAL COMPARTMENT (DEQUERVAIN);  Surgeon: Carole Civil, MD;  Location: [facility];  Service: Orthopedics;  Laterality: Left;  . DORSAL COMPARTMENT RELEASE Right 09/11/2013   Procedure: RELEASE DORSAL COMPARTMENT (DEQUERVAIN);  Surgeon: Carole Civil, MD;  Location: [facility];  Service: Orthopedics;  Laterality: Right;  . ESOPHAGEAL DILATION  08/04/2012   Procedure: ESOPHAGEAL DILATION;  Surgeon: Daneil Dolin, MD;  Location: [facility];  Service: Endoscopy;;  . ESOPHAGOGASTRODUODENOSCOPY  08/21/2003      Normal tubular esophagus status post passage of Maloney dilator as  described above/  2. Multiple linear antral erosions of uncertain significance, could be NSAID  . ESOPHAGOGASTRODUODENOSCOPY  08/04/2012   Procedure: ESOPHAGOGASTRODUODENOSCOPY (EGD);  Surgeon: Daneil Dolin, MD;  Location: [facility];  Service: Endoscopy;  Laterality: N/A;  7:30  . ROTATOR CUFF REPAIR    . SHOULDER SURGERY     Rotator Cuff  . TRIGGER FINGER RELEASE Right 09/11/2013   Procedure: RIGHT LONG FINGER RELEASE TRIGGER FINGER;  Surgeon: Carole Civil, MD;  Location: [facility];  Service:  Orthopedics;  Laterality: Right;   Social History   Social History  . Marital status: Divorced    Spouse name: N/A  . Number of children: 4  . Years of education: N/A   Occupational History  .  Retired   Social History Main Topics  . Smoking status: Former Smoker    Packs/day: 0.50    Years: 25.00    Types: Cigarettes    Quit date: 01/31/1963  . Smokeless tobacco: Never Used  . Alcohol use No  . Drug use: No  . Sexual activity: Yes    Birth control/ protection: Surgical   Other Topics Concern  . None   Social History Narrative  . None   Outpatient Encounter Prescriptions as of 08/30/2017  Medication Sig  . albuterol (PROVENTIL) (2.5 MG/3ML) 0.083% nebulizer solution Take 2.5 mg by nebulization every 6 (six) hours as needed for wheezing or shortness of breath.  . ALPRAZolam (XANAX) 0.25 MG tablet Take 0.25 mg by mouth 3 (three) times daily as needed for anxiety.   Marland Kitchen amLODipine (NORVASC) 10 MG tablet TAKE (1) TABLET BY MOUTH EACH MORNING FOR BLOOD PRESSURE.  Marland Kitchen aspirin 81 MG tablet Take 81 mg by mouth daily.  . Calcium Carbonate-Vitamin D (CALCIUM + D PO) Take 1 tablet by mouth daily.   Marland Kitchen escitalopram (LEXAPRO) 20 MG tablet Take 20 mg by mouth daily.  Marland Kitchen levothyroxine (SYNTHROID, LEVOTHROID) 112 MCG tablet TAKE ONE TABLET BY MOUTH DAILY BEFORE BREAKFAST.  Marland Kitchen losartan-hydrochlorothiazide (HYZAAR) 100-12.5 MG per  tablet Take 1 tablet by mouth daily.  . Multiple Vitamin (MULTIVITAMIN WITH MINERALS) TABS tablet Take 1 tablet by mouth daily.  . nitroGLYCERIN (NITROSTAT) 0.4 MG SL tablet Place 0.4 mg under the tongue every 5 (five) minutes as needed for chest pain.  Marland Kitchen oxybutynin (DITROPAN-XL) 10 MG 24 hr tablet Take 10 mg by mouth daily as needed.   . polyethylene glycol powder (GLYCOLAX/MIRALAX) powder MIX 1 CAPFUL (17 GRAMS) WITH 8OZ OF WATER OR JUICE DAILY.  . [DISCONTINUED] levothyroxine (SYNTHROID, LEVOTHROID) 112 MCG tablet TAKE ONE TABLET BY MOUTH DAILY BEFORE BREAKFAST.   No  facility-administered encounter medications on file as of 08/30/2017.    ALLERGIES: Allergies  Allergen Reactions  . Penicillins Rash and Other (See Comments)    Unknown    VACCINATION STATUS:  There is no immunization history on file for this patient.  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 She is on Levothyroxine 112 mcg po qam. - She is accompanied by her other grown daughter who reports she is doing well on her doses now. She feels better, no new complaints. She has consistent body weight for the last 3 visits.  Review of Systems Constitutional: + steady weight, -  fatigue,  - subjective hypothermia Eyes: no blurry vision, no xerophthalmia ENT: no sore throat, no nodules palpated in throat, no dysphagia/odynophagia, no hoarseness Cardiovascular: no CP/SOB/palpitations/leg swelling Respiratory: no cough/SOB Gastrointestinal: no N/V/D/C Musculoskeletal: no muscle/joint aches Skin: no rashes Neurological: no tremors/numbness/tingling/dizziness Psychiatric: no depression/anxiety  Objective:    BP 134/84   Pulse (!) 58   Ht 5\' 5"  (1.651 m)   Wt 145 lb (65.8 kg)   BMI 24.13 kg/m   Wt Readings from Last 3 Encounters:  08/30/17 145 lb (65.8 kg)  03/01/17 145 lb (65.8 kg)  11/30/16 155 lb (70.3 kg)    Physical Exam  Constitutional: in NAD Eyes: PERRLA, EOMI, no exophthalmos ENT: moist mucous membranes, no thyromegaly, no cervical lymphadenopathy Cardiovascular: RRR, No MRG Respiratory: CTA B Gastrointestinal: abdomen soft, NT, ND, BS+ Musculoskeletal: no deformities, strength intact in all 4 Skin: moist, warm, no rashes Neurological: no tremor with outstretched hands, DTR normal in all 4  Recent Results (from the past 2160 hour(s))  TSH     Status: Abnormal   Collection Time: 08/23/17  9:22 AM  Result Value Ref Range   TSH 0.09 (L) 0.40 - 4.50 mIU/L  T4, free     Status: None   Collection Time: 08/23/17  9:22 AM  Result Value Ref Range    Free T4 1.5 0.8 - 1.8 ng/dL      Assessment & Plan:   1. RAI induced Hypothyroidism - -  Her daughter is offering to help to remind her to take her thyroid hormone daily. Her TSH is slightly suppressed, however she would benefit on the same current dose of   levothyroxine at 112 g by mouth every morning for the next 6 months of cold weather.   - We discussed about correct intake of levothyroxine, at fasting, with water, separated by at least 30 minutes from breakfast, and separated by more than 4 hours from calcium, iron, multivitamins, acid reflux medications (PPIs). -Patient is made aware of the fact that thyroid hormone replacement is needed for life, dose to be adjusted by periodic monitoring of thyroid function tests. - Her blood pressure is controlled, advised her to continue  amlodipine and Hyzaar at current doses.   - I advised patient  to maintain close follow up with Sinda Du, MD for primary care needs. Follow up plan: Return in about 6 months (around 02/28/2018) for follow up with pre-visit labs.  Glade Lloyd, MD Phone: [phone]  Fax: [phone]  -  This note was partially dictated with voice recognition software. Similar sounding words can be transcribed inadequately or may not  be corrected upon review.  08/30/2017, 8:44 AM

## 2017-11-11 DIAGNOSIS — I10 Essential (primary) hypertension: Secondary | ICD-10-CM | POA: Diagnosis not present

## 2017-11-11 DIAGNOSIS — J449 Chronic obstructive pulmonary disease, unspecified: Secondary | ICD-10-CM | POA: Diagnosis not present

## 2017-11-11 DIAGNOSIS — I2510 Atherosclerotic heart disease of native coronary artery without angina pectoris: Secondary | ICD-10-CM | POA: Diagnosis not present

## 2017-11-11 DIAGNOSIS — F0390 Unspecified dementia without behavioral disturbance: Secondary | ICD-10-CM | POA: Diagnosis not present

## 2017-11-19 ENCOUNTER — Other Ambulatory Visit: Payer: Self-pay | Admitting: "Endocrinology

## 2018-02-10 DIAGNOSIS — J449 Chronic obstructive pulmonary disease, unspecified: Secondary | ICD-10-CM | POA: Diagnosis not present

## 2018-02-10 DIAGNOSIS — F0390 Unspecified dementia without behavioral disturbance: Secondary | ICD-10-CM | POA: Diagnosis not present

## 2018-02-10 DIAGNOSIS — I2510 Atherosclerotic heart disease of native coronary artery without angina pectoris: Secondary | ICD-10-CM | POA: Diagnosis not present

## 2018-02-10 DIAGNOSIS — I10 Essential (primary) hypertension: Secondary | ICD-10-CM | POA: Diagnosis not present

## 2018-02-21 DIAGNOSIS — E890 Postprocedural hypothyroidism: Secondary | ICD-10-CM | POA: Diagnosis not present

## 2018-02-21 LAB — T4, FREE: Free T4: 1.2 ng/dL (ref 0.8–1.8)

## 2018-02-21 LAB — TSH: TSH: 1.2 mIU/L (ref 0.40–4.50)

## 2018-02-28 ENCOUNTER — Ambulatory Visit (INDEPENDENT_AMBULATORY_CARE_PROVIDER_SITE_OTHER): Payer: Medicare Other | Admitting: "Endocrinology

## 2018-02-28 ENCOUNTER — Encounter: Payer: Self-pay | Admitting: "Endocrinology

## 2018-02-28 VITALS — BP 132/78 | HR 61 | Ht 65.0 in | Wt 143.0 lb

## 2018-02-28 DIAGNOSIS — E890 Postprocedural hypothyroidism: Secondary | ICD-10-CM

## 2018-02-28 MED ORDER — LEVOTHYROXINE SODIUM 112 MCG PO TABS: 112.0000 ug | ORAL_TABLET | Freq: Every day | ORAL | 4 refills | Status: DC

## 2018-02-28 NOTE — Progress Notes (Signed)
Subjective:    Patient ID: Lauren [name], female    DOB: 06-27-1934, PCP Sinda Du, MD   Past Medical History:  Diagnosis Date  . Asthma   . Back pain   . COPD (chronic obstructive pulmonary disease) (HCC)    O2 at night sometimes  . Essential hypertension, benign   . GERD (gastroesophageal reflux disease)   . History of cardiac catheterization    Reportedly normal coronaries in 2000  . Hyperlipidemia   . Osteoarthritis    Past Surgical History:  Procedure Laterality Date  . ABDOMINAL HYSTERECTOMY  1994  . ANKLE SURGERY     Rod and 13 screws  . APPENDECTOMY    . BREAST BIOPSY  1996   Benign  . COLONOSCOPY  01/27/2011   Normal rectum/Sigmoid diverticula/Multiple colonic polyps removed (TUBULAR ADENOMA) Next TCS 01/2016  . DORSAL COMPARTMENT RELEASE Left 02/03/2013   Procedure: RELEASE DORSAL COMPARTMENT (DEQUERVAIN);  Surgeon: Carole Civil, MD;  Location: [facility];  Service: Orthopedics;  Laterality: Left;  . DORSAL COMPARTMENT RELEASE Right 09/11/2013   Procedure: RELEASE DORSAL COMPARTMENT (DEQUERVAIN);  Surgeon: Carole Civil, MD;  Location: [facility];  Service: Orthopedics;  Laterality: Right;  . ESOPHAGEAL DILATION  08/04/2012   Procedure: ESOPHAGEAL DILATION;  Surgeon: Daneil Dolin, MD;  Location: [facility];  Service: Endoscopy;;  . ESOPHAGOGASTRODUODENOSCOPY  08/21/2003      Normal tubular esophagus status post passage of Maloney dilator as  described above/  2. Multiple linear antral erosions of uncertain significance, could be NSAID  . ESOPHAGOGASTRODUODENOSCOPY  08/04/2012   Procedure: ESOPHAGOGASTRODUODENOSCOPY (EGD);  Surgeon: Daneil Dolin, MD;  Location: [facility];  Service: Endoscopy;  Laterality: N/A;  7:30  . ROTATOR CUFF REPAIR    . SHOULDER SURGERY     Rotator Cuff  . TRIGGER FINGER RELEASE Right 09/11/2013   Procedure: RIGHT LONG FINGER RELEASE TRIGGER FINGER;  Surgeon: Carole Civil, MD;  Location: [facility];  Service:  Orthopedics;  Laterality: Right;   Social History   Socioeconomic History  . Marital status: Divorced    Spouse name: Not on file  . Number of children: 4  . Years of education: Not on file  . Highest education level: Not on file  Occupational History    Employer: RETIRED  Social Needs  . Financial resource strain: Not on file  . Food insecurity:    Worry: Not on file    Inability: Not on file  . Transportation needs:    Medical: Not on file    Non-medical: Not on file  Tobacco Use  . Smoking status: Former Smoker    Packs/day: 0.50    Years: 25.00    Pack years: 12.50    Types: Cigarettes    Last attempt to quit: 01/31/1963    Years since quitting: 55.1  . Smokeless tobacco: Never Used  Substance and Sexual Activity  . Alcohol use: No    Alcohol/week: 0.0 oz  . Drug use: No  . Sexual activity: Yes    Birth control/protection: Surgical  Lifestyle  . Physical activity:    Days per week: Not on file    Minutes per session: Not on file  . Stress: Not on file  Relationships  . Social connections:    Talks on phone: Not on file    Gets together: Not on file    Attends religious service: Not on file    Active member of club or organization: Not on file  Attends meetings of clubs or organizations: Not on file    Relationship status: Not on file  Other Topics Concern  . Not on file  Social History Narrative  . Not on file   Outpatient Encounter Medications as of 02/28/2018  Medication Sig  . DULoxetine (CYMBALTA) 30 MG capsule Take 30 mg by mouth daily.  Marland Kitchen albuterol (PROVENTIL) (2.5 MG/3ML) 0.083% nebulizer solution Take 2.5 mg by nebulization every 6 (six) hours as needed for wheezing or shortness of breath.  . ALPRAZolam (XANAX) 0.25 MG tablet Take 0.25 mg by mouth 3 (three) times daily as needed for anxiety.   Marland Kitchen amLODipine (NORVASC) 10 MG tablet TAKE (1) TABLET BY MOUTH EACH MORNING FOR BLOOD PRESSURE.  Marland Kitchen aspirin 81 MG tablet Take 81 mg by mouth daily.  . Calcium  Carbonate-Vitamin D (CALCIUM + D PO) Take 1 tablet by mouth daily.   Marland Kitchen levothyroxine (SYNTHROID, LEVOTHROID) 112 MCG tablet Take 1 tablet (112 mcg total) by mouth daily before breakfast.  . losartan-hydrochlorothiazide (HYZAAR) 100-12.5 MG per tablet Take 1 tablet by mouth daily.  . Multiple Vitamin (MULTIVITAMIN WITH MINERALS) TABS tablet Take 1 tablet by mouth daily.  . nitroGLYCERIN (NITROSTAT) 0.4 MG SL tablet Place 0.4 mg under the tongue every 5 (five) minutes as needed for chest pain.  Marland Kitchen oxybutynin (DITROPAN-XL) 10 MG 24 hr tablet Take 10 mg by mouth daily as needed.   . polyethylene glycol powder (GLYCOLAX/MIRALAX) powder MIX 1 CAPFUL (17 GRAMS) WITH 8OZ OF WATER OR JUICE DAILY.  . [DISCONTINUED] escitalopram (LEXAPRO) 20 MG tablet Take 20 mg by mouth daily.  . [DISCONTINUED] levothyroxine (SYNTHROID, LEVOTHROID) 112 MCG tablet TAKE ONE TABLET BY MOUTH DAILY BEFORE BREAKFAST.   No facility-administered encounter medications on file as of 02/28/2018.    ALLERGIES: Allergies  Allergen Reactions  . Penicillins Rash and Other (See Comments)    Unknown    VACCINATION STATUS:  There is no immunization history on file for this patient.  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 She is on Levothyroxine 112 mcg po qam. - She is accompanied by her other grown daughter who reports she is doing well on her doses now. She has no new complaints, she feels better.  She has stable weight.  She denies palpitations, tremors, heat/cold intolerance.     Review of Systems Constitutional: + steady weight, -  fatigue,  -subjective hyperthermia Eyes: no blurry vision, no xerophthalmia ENT: no sore throat, no nodules palpated in throat, no dysphagia/odynophagia, no hoarseness Cardiovascular: Chest pain, palpitations, leg swelling.  Respiratory: no cough/SOB Gastrointestinal: no N/V/D/C Musculoskeletal: no muscle/joint aches Skin: no rashes Neurological: no tremors, numbness,  tingling.  Psychiatric: no depression/anxiety  Objective:    BP 132/78   Pulse 61   Ht 5\' 5"  (1.651 m)   Wt 143 lb (64.9 kg)   BMI 23.80 kg/m   Wt Readings from Last 3 Encounters:  02/28/18 143 lb (64.9 kg)  08/30/17 145 lb (65.8 kg)  03/01/17 145 lb (65.8 kg)    Physical Exam  Constitutional: Stable state of mind, not in acute distress, alert and oriented x3. Eyes: PERRLA, EOMI, no exophthalmos ENT: moist mucous membranes, no thyromegaly, no cervical lymphadenopathy. Cardiovascular: RRR, No MRG Respiratory: CTA B Gastrointestinal: abdomen soft, NT, ND, BS+ Musculoskeletal: no deformities, strength intact in all 4 Skin: moist, warm, no rashes Neurological: no tremors of outstretched hands  Recent Results (from the past 2160 hour(s))  TSH  Status: None   Collection Time: 02/21/18  9:16 AM  Result Value Ref Range   TSH 1.20 0.40 - 4.50 mIU/L  T4, free     Status: None   Collection Time: 02/21/18  9:16 AM  Result Value Ref Range   Free T4 1.2 0.8 - 1.8 ng/dL      Assessment & Plan:   1. RAI induced Hypothyroidism - -her thyroid function tests are consistent with appropriate replacement.  I discussed and continued her dose of levothyroxine the same at 112 mcg p.o. nightly.    - We discussed about correct intake of levothyroxine, at fasting, with water, separated by at least 30 minutes from breakfast, and separated by more than 4 hours from calcium, iron, multivitamins, acid reflux medications (PPIs). -Patient is made aware of the fact that thyroid hormone replacement is needed for life, dose to be adjusted by periodic monitoring of thyroid function tests. - Her blood pressure is controlled, advised her to continue  amlodipine and Hyzaar at current doses.   - I advised patient to maintain close follow up with Sinda Du, MD for primary care needs. Follow up plan: Return in about 1 year (around 03/01/2019) for follow up with pre-visit labs.  Glade Lloyd,  MD Phone: [phone]  Fax: [phone]  -  This note was partially dictated with voice recognition software. Similar sounding words can be transcribed inadequately or may not  be corrected upon review.  02/28/2018, 8:36 AM

## 2018-03-10 ENCOUNTER — Other Ambulatory Visit: Payer: Self-pay | Admitting: "Endocrinology

## 2018-03-24 DIAGNOSIS — I2510 Atherosclerotic heart disease of native coronary artery without angina pectoris: Secondary | ICD-10-CM | POA: Diagnosis not present

## 2018-03-24 DIAGNOSIS — J449 Chronic obstructive pulmonary disease, unspecified: Secondary | ICD-10-CM | POA: Diagnosis not present

## 2018-03-24 DIAGNOSIS — F321 Major depressive disorder, single episode, moderate: Secondary | ICD-10-CM | POA: Diagnosis not present

## 2018-03-24 DIAGNOSIS — F0390 Unspecified dementia without behavioral disturbance: Secondary | ICD-10-CM | POA: Diagnosis not present

## 2018-06-27 DIAGNOSIS — J449 Chronic obstructive pulmonary disease, unspecified: Secondary | ICD-10-CM | POA: Diagnosis not present

## 2018-06-27 DIAGNOSIS — I2510 Atherosclerotic heart disease of native coronary artery without angina pectoris: Secondary | ICD-10-CM | POA: Diagnosis not present

## 2018-06-27 DIAGNOSIS — J301 Allergic rhinitis due to pollen: Secondary | ICD-10-CM | POA: Diagnosis not present

## 2018-06-27 DIAGNOSIS — F0390 Unspecified dementia without behavioral disturbance: Secondary | ICD-10-CM | POA: Diagnosis not present

## 2018-08-05 ENCOUNTER — Other Ambulatory Visit: Payer: Self-pay | Admitting: "Endocrinology

## 2018-08-22 DIAGNOSIS — Z23 Encounter for immunization: Secondary | ICD-10-CM | POA: Diagnosis not present

## 2018-11-21 DIAGNOSIS — J449 Chronic obstructive pulmonary disease, unspecified: Secondary | ICD-10-CM | POA: Diagnosis not present

## 2018-11-21 DIAGNOSIS — I10 Essential (primary) hypertension: Secondary | ICD-10-CM | POA: Diagnosis not present

## 2018-11-21 DIAGNOSIS — I2510 Atherosclerotic heart disease of native coronary artery without angina pectoris: Secondary | ICD-10-CM | POA: Diagnosis not present

## 2018-11-21 DIAGNOSIS — F0390 Unspecified dementia without behavioral disturbance: Secondary | ICD-10-CM | POA: Diagnosis not present

## 2019-03-01 ENCOUNTER — Ambulatory Visit: Payer: Medicare Other | Admitting: "Endocrinology

## 2019-03-29 DIAGNOSIS — J449 Chronic obstructive pulmonary disease, unspecified: Secondary | ICD-10-CM | POA: Diagnosis not present

## 2019-03-29 DIAGNOSIS — F0390 Unspecified dementia without behavioral disturbance: Secondary | ICD-10-CM | POA: Diagnosis not present

## 2019-03-29 DIAGNOSIS — I2510 Atherosclerotic heart disease of native coronary artery without angina pectoris: Secondary | ICD-10-CM | POA: Diagnosis not present

## 2019-03-29 DIAGNOSIS — I10 Essential (primary) hypertension: Secondary | ICD-10-CM | POA: Diagnosis not present

## 2019-04-26 ENCOUNTER — Other Ambulatory Visit: Payer: Self-pay | Admitting: "Endocrinology

## 2019-06-12 ENCOUNTER — Other Ambulatory Visit: Payer: Self-pay | Admitting: "Endocrinology

## 2019-06-21 ENCOUNTER — Other Ambulatory Visit: Payer: Self-pay

## 2019-07-13 DIAGNOSIS — J449 Chronic obstructive pulmonary disease, unspecified: Secondary | ICD-10-CM | POA: Diagnosis not present

## 2019-07-13 DIAGNOSIS — I10 Essential (primary) hypertension: Secondary | ICD-10-CM | POA: Diagnosis not present

## 2019-07-13 DIAGNOSIS — N3281 Overactive bladder: Secondary | ICD-10-CM | POA: Diagnosis not present

## 2019-07-13 DIAGNOSIS — I2510 Atherosclerotic heart disease of native coronary artery without angina pectoris: Secondary | ICD-10-CM | POA: Diagnosis not present

## 2019-07-19 DIAGNOSIS — J449 Chronic obstructive pulmonary disease, unspecified: Secondary | ICD-10-CM | POA: Diagnosis not present

## 2019-07-19 DIAGNOSIS — E782 Mixed hyperlipidemia: Secondary | ICD-10-CM | POA: Diagnosis not present

## 2019-07-19 DIAGNOSIS — I119 Hypertensive heart disease without heart failure: Secondary | ICD-10-CM | POA: Diagnosis not present

## 2019-07-19 DIAGNOSIS — I2510 Atherosclerotic heart disease of native coronary artery without angina pectoris: Secondary | ICD-10-CM | POA: Diagnosis not present

## 2019-07-19 LAB — CBC AND DIFFERENTIAL
HCT: 34 — AB (ref 36–46)
Hemoglobin: 11 — AB (ref 12.0–16.0)
Platelets: 225 (ref 150–399)
WBC: 5.1

## 2019-07-19 LAB — LIPID PANEL
Cholesterol: 232 — AB (ref 0–200)
HDL: 80 — AB (ref 35–70)
LDL Cholesterol: 137
Triglycerides: 56 (ref 40–160)

## 2019-07-19 LAB — BASIC METABOLIC PANEL
BUN: 22 — AB (ref 4–21)
CO2: 104 — AB (ref 13–22)
Chloride: 104 (ref 99–108)
Creatinine: 1.3 — AB (ref <=1.1)
Glucose: 84
Potassium: 4.9 (ref 3.4–5.3)
Sodium: 140 (ref 137–147)

## 2019-07-19 LAB — HEPATIC FUNCTION PANEL
ALT: 16 (ref 7–35)
AST: 23 (ref 13–35)
Alkaline Phosphatase: 64 (ref 25–125)

## 2019-07-19 LAB — COMPREHENSIVE METABOLIC PANEL
Albumin: 4.2 (ref 3.5–5.0)
Calcium: 9.5 (ref 8.7–10.7)
GFR calc Af Amer: 46
GFR calc non Af Amer: 39
Globulin: 2.5

## 2019-07-19 LAB — CBC: RBC: 3.85 — AB (ref 3.87–5.11)

## 2019-07-19 LAB — TSH: TSH: 0.06 — AB (ref <=5.90)

## 2019-08-04 ENCOUNTER — Other Ambulatory Visit: Payer: Self-pay | Admitting: "Endocrinology

## 2019-08-08 ENCOUNTER — Other Ambulatory Visit: Payer: Self-pay | Admitting: "Endocrinology

## 2019-08-08 DIAGNOSIS — E890 Postprocedural hypothyroidism: Secondary | ICD-10-CM | POA: Diagnosis not present

## 2019-08-09 LAB — TSH: TSH: 0.27 mIU/L — ABNORMAL LOW (ref 0.40–4.50)

## 2019-08-09 LAB — T4, FREE: Free T4: 1.1 ng/dL (ref 0.8–1.8)

## 2019-08-10 ENCOUNTER — Other Ambulatory Visit: Payer: Self-pay

## 2019-08-10 ENCOUNTER — Encounter: Payer: Self-pay | Admitting: "Endocrinology

## 2019-08-10 ENCOUNTER — Ambulatory Visit (INDEPENDENT_AMBULATORY_CARE_PROVIDER_SITE_OTHER): Payer: Medicare Other | Admitting: "Endocrinology

## 2019-08-10 VITALS — BP 136/72 | HR 58 | Ht 65.0 in | Wt 140.0 lb

## 2019-08-10 DIAGNOSIS — E890 Postprocedural hypothyroidism: Secondary | ICD-10-CM | POA: Diagnosis not present

## 2019-08-10 MED ORDER — LEVOTHYROXINE SODIUM 100 MCG PO TABS: 100.0000 ug | ORAL_TABLET | Freq: Every day | ORAL | 6 refills | Status: DC

## 2019-08-10 NOTE — Progress Notes (Signed)
08/10/2019      Endocrinology follow-up note   Subjective:    Patient ID: Lauren [name], female    DOB: January 03, 1934, PCP Sinda Du, MD   Past Medical History:  Diagnosis Date  . Asthma   . Back pain   . COPD (chronic obstructive pulmonary disease) (HCC)    O2 at night sometimes  . Essential hypertension, benign   . GERD (gastroesophageal reflux disease)   . History of cardiac catheterization    Reportedly normal coronaries in 2000  . Hyperlipidemia   . Osteoarthritis    Past Surgical History:  Procedure Laterality Date  . ABDOMINAL HYSTERECTOMY  1994  . ANKLE SURGERY     Rod and 13 screws  . APPENDECTOMY    . BREAST BIOPSY  1996   Benign  . COLONOSCOPY  01/27/2011   Normal rectum/Sigmoid diverticula/Multiple colonic polyps removed (TUBULAR ADENOMA) Next TCS 01/2016  . DORSAL COMPARTMENT RELEASE Left 02/03/2013   Procedure: RELEASE DORSAL COMPARTMENT (DEQUERVAIN);  Surgeon: Carole Civil, MD;  Location: [facility];  Service: Orthopedics;  Laterality: Left;  . DORSAL COMPARTMENT RELEASE Right 09/11/2013   Procedure: RELEASE DORSAL COMPARTMENT (DEQUERVAIN);  Surgeon: Carole Civil, MD;  Location: [facility];  Service: Orthopedics;  Laterality: Right;  . ESOPHAGEAL DILATION  08/04/2012   Procedure: ESOPHAGEAL DILATION;  Surgeon: Daneil Dolin, MD;  Location: [facility];  Service: Endoscopy;;  . ESOPHAGOGASTRODUODENOSCOPY  08/21/2003      Normal tubular esophagus status post passage of Maloney dilator as  described above/  2. Multiple linear antral erosions of uncertain significance, could be NSAID  . ESOPHAGOGASTRODUODENOSCOPY  08/04/2012   Procedure: ESOPHAGOGASTRODUODENOSCOPY (EGD);  Surgeon: Daneil Dolin, MD;  Location: [facility];  Service: Endoscopy;  Laterality: N/A;  7:30  . ROTATOR CUFF REPAIR    . SHOULDER SURGERY     Rotator Cuff  . TRIGGER FINGER RELEASE Right 09/11/2013   Procedure: RIGHT LONG FINGER RELEASE TRIGGER FINGER;  Surgeon: Carole Civil, MD;  Location: [facility];  Service: Orthopedics;  Laterality: Right;   Social History   Socioeconomic History  . Marital status: Divorced    Spouse name: Not on file  . Number of children: 4  . Years of education: Not on file  . Highest education level: Not on file  Occupational History    Employer: RETIRED  Social Needs  . Financial resource strain: Not on file  . Food insecurity    Worry: Not on file    Inability: Not on file  . Transportation needs    Medical: Not on file    Non-medical: Not on file  Tobacco Use  . Smoking status: Former Smoker    Packs/day: 0.50    Years: 25.00    Pack years: 12.50    Types: Cigarettes    Quit date: 01/31/1963    Years since quitting: 56.5  . Smokeless tobacco: Never Used  Substance and Sexual Activity  . Alcohol use: No    Alcohol/week: 0.0 standard drinks  . Drug use: No  . Sexual activity: Yes    Birth control/protection: Surgical  Lifestyle  . Physical activity    Days per week: Not on file    Minutes per session: Not on file  . Stress: Not on file  Relationships  . Social Herbalist on phone: Not on file    Gets together: Not on file    Attends religious service: Not on file  Active member of club or organization: Not on file    Attends meetings of clubs or organizations: Not on file    Relationship status: Not on file  Other Topics Concern  . Not on file  Social History Narrative  . Not on file   Outpatient Encounter Medications as of 08/10/2019  Medication Sig  . amLODipine (NORVASC) 10 MG tablet TAKE (1) TABLET BY MOUTH EACH MORNING FOR BLOOD PRESSURE.  Marland Kitchen aspirin 81 MG tablet Take 81 mg by mouth daily.  . Calcium Carbonate-Vitamin D (CALCIUM + D PO) Take 1 tablet by mouth daily.   . DULoxetine (CYMBALTA) 30 MG capsule Take 30 mg by mouth daily.  Marland Kitchen levothyroxine (SYNTHROID) 100 MCG tablet Take 1 tablet (100 mcg total) by mouth daily before breakfast.  . losartan-hydrochlorothiazide (HYZAAR)  100-12.5 MG per tablet Take 1 tablet by mouth daily.  . [DISCONTINUED] albuterol (PROVENTIL) (2.5 MG/3ML) 0.083% nebulizer solution Take 2.5 mg by nebulization every 6 (six) hours as needed for wheezing or shortness of breath.  . [DISCONTINUED] ALPRAZolam (XANAX) 0.25 MG tablet Take 0.25 mg by mouth 3 (three) times daily as needed for anxiety.   . [DISCONTINUED] levothyroxine (SYNTHROID) 112 MCG tablet TAKE ONE TABLET BY MOUTH DAILY BEFORE BREAKFAST.  . [DISCONTINUED] Multiple Vitamin (MULTIVITAMIN WITH MINERALS) TABS tablet Take 1 tablet by mouth daily.  . [DISCONTINUED] nitroGLYCERIN (NITROSTAT) 0.4 MG SL tablet Place 0.4 mg under the tongue every 5 (five) minutes as needed for chest pain.  . [DISCONTINUED] oxybutynin (DITROPAN-XL) 10 MG 24 hr tablet Take 10 mg by mouth daily as needed.   . [DISCONTINUED] polyethylene glycol powder (GLYCOLAX/MIRALAX) powder MIX 1 CAPFUL (17 GRAMS) WITH 8OZ OF WATER OR JUICE DAILY.   No facility-administered encounter medications on file as of 08/10/2019.    ALLERGIES: Allergies  Allergen Reactions  . Penicillins Rash and Other (See Comments)    Unknown    VACCINATION STATUS:  There is no immunization history on file for this patient.  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 She is on Levothyroxine 112 mcg po qam. - She is accompanied by her  grown daughter who reports she is doing well on her doses now, except that she gets irritable at times. She has no new complaints, she feels better.  She has progressively lost 5 pounds since October 2019.  She denies palpitations, heat intolerance, tremors.    Review of Systems Constitutional: +  Weight loss, -  fatigue,  -subjective hyperthermia Eyes: no blurry vision, no xerophthalmia ENT: no sore throat, no nodules palpated in throat, no dysphagia/odynophagia, no hoarseness Cardiovascular: Chest pain, palpitations, leg swelling.  Musculoskeletal: no muscle/joint aches Skin: no  rashes Neurological: no tremors, numbness, tingling.  Psychiatric: no depression/anxiety  Objective:    BP 136/72   Pulse (!) 58   Ht 5\' 5"  (1.651 m)   Wt 140 lb (63.5 kg)   BMI 23.30 kg/m   Wt Readings from Last 3 Encounters:  08/10/19 140 lb (63.5 kg)  02/28/18 143 lb (64.9 kg)  08/30/17 145 lb (65.8 kg)    Physical Exam   Physical Exam- Limited  Constitutional:  Body mass index is 23.3 kg/m. , not in acute distress, normal state of mind Eyes:  EOMI, no exophthalmos Neck: Supple Respiratory: Adequate breathing efforts Musculoskeletal: no gross deformities, strength intact in all four extremities, no gross restriction of joint movements Skin:  no rashes, no hyperemia Neurological: no tremor with outstretched hands.  Recent Results (from the past 2160 hour(s))  TSH     Status: Abnormal   Collection Time: 08/08/19  2:23 PM  Result Value Ref Range   TSH 0.27 (L) 0.40 - 4.50 mIU/L  T4, free     Status: None   Collection Time: 08/08/19  2:23 PM  Result Value Ref Range   Free T4 1.1 0.8 - 1.8 ng/dL      Assessment & Plan:   1. RAI induced Hypothyroidism - -her thyroid function tests are consistent with slight over replacement.  I discussed and lowered her levothyroxine to 100 mcg.  Daily before breakfast.     - We discussed about the correct intake of her thyroid hormone, on empty stomach at fasting, with water, separated by at least 30 minutes from breakfast and other medications,  and separated by more than 4 hours from calcium, iron, multivitamins, acid reflux medications (PPIs). -Patient is made aware of the fact that thyroid hormone replacement is needed for life, dose to be adjusted by periodic monitoring of thyroid function tests.   - I advised patient to maintain close follow up with Sinda Du, MD for primary care needs.   Time for this visit: 15 minutes. Lauren [name]  participated in the discussions, expressed understanding, and voiced  agreement with the above plans.  All questions were answered to her satisfaction. she is encouraged to contact clinic should she have any questions or concerns prior to her return visit.  Follow up plan: Return in about 6 months (around 02/07/2020) for Follow up with Pre-visit Labs.  Glade Lloyd, MD Phone: [phone]  Fax: [phone]  -  This note was partially dictated with voice recognition software. Similar sounding words can be transcribed inadequately or may not  be corrected upon review.  08/10/2019, 4:10 PM

## 2019-08-28 DIAGNOSIS — Z23 Encounter for immunization: Secondary | ICD-10-CM | POA: Diagnosis not present

## 2019-10-03 ENCOUNTER — Other Ambulatory Visit: Payer: Self-pay | Admitting: "Endocrinology

## 2019-10-12 DIAGNOSIS — M159 Polyosteoarthritis, unspecified: Secondary | ICD-10-CM | POA: Diagnosis not present

## 2019-10-12 DIAGNOSIS — F0390 Unspecified dementia without behavioral disturbance: Secondary | ICD-10-CM | POA: Diagnosis not present

## 2019-10-12 DIAGNOSIS — Z23 Encounter for immunization: Secondary | ICD-10-CM | POA: Diagnosis not present

## 2019-10-12 DIAGNOSIS — J449 Chronic obstructive pulmonary disease, unspecified: Secondary | ICD-10-CM | POA: Diagnosis not present

## 2019-10-12 DIAGNOSIS — I2510 Atherosclerotic heart disease of native coronary artery without angina pectoris: Secondary | ICD-10-CM | POA: Diagnosis not present

## 2019-10-25 DIAGNOSIS — M159 Polyosteoarthritis, unspecified: Secondary | ICD-10-CM

## 2019-10-25 DIAGNOSIS — F0390 Unspecified dementia without behavioral disturbance: Secondary | ICD-10-CM

## 2019-10-25 DIAGNOSIS — F329 Major depressive disorder, single episode, unspecified: Secondary | ICD-10-CM

## 2019-10-25 DIAGNOSIS — E039 Hypothyroidism, unspecified: Secondary | ICD-10-CM

## 2019-10-25 DIAGNOSIS — G4700 Insomnia, unspecified: Secondary | ICD-10-CM

## 2019-10-25 DIAGNOSIS — J45909 Unspecified asthma, uncomplicated: Secondary | ICD-10-CM

## 2019-10-25 DIAGNOSIS — D249 Benign neoplasm of unspecified breast: Secondary | ICD-10-CM

## 2019-10-25 DIAGNOSIS — N3281 Overactive bladder: Secondary | ICD-10-CM

## 2019-10-25 DIAGNOSIS — F419 Anxiety disorder, unspecified: Secondary | ICD-10-CM

## 2019-10-25 DIAGNOSIS — I119 Hypertensive heart disease without heart failure: Secondary | ICD-10-CM

## 2019-10-25 DIAGNOSIS — N816 Rectocele: Secondary | ICD-10-CM

## 2019-10-25 DIAGNOSIS — J449 Chronic obstructive pulmonary disease, unspecified: Secondary | ICD-10-CM

## 2019-10-25 DIAGNOSIS — I2510 Atherosclerotic heart disease of native coronary artery without angina pectoris: Secondary | ICD-10-CM

## 2019-10-25 DIAGNOSIS — R5383 Other fatigue: Secondary | ICD-10-CM

## 2019-10-25 DIAGNOSIS — K219 Gastro-esophageal reflux disease without esophagitis: Secondary | ICD-10-CM

## 2019-12-16 DIAGNOSIS — Z23 Encounter for immunization: Secondary | ICD-10-CM | POA: Diagnosis not present

## 2020-01-15 ENCOUNTER — Other Ambulatory Visit: Payer: Self-pay | Admitting: "Endocrinology

## 2020-01-15 DIAGNOSIS — Z23 Encounter for immunization: Secondary | ICD-10-CM | POA: Diagnosis not present

## 2020-01-28 ENCOUNTER — Other Ambulatory Visit: Payer: Self-pay | Admitting: "Endocrinology

## 2020-02-08 ENCOUNTER — Ambulatory Visit: Payer: Medicare Other | Admitting: "Endocrinology

## 2020-02-12 ENCOUNTER — Other Ambulatory Visit: Payer: Self-pay

## 2020-02-12 DIAGNOSIS — E890 Postprocedural hypothyroidism: Secondary | ICD-10-CM | POA: Diagnosis not present

## 2020-02-12 LAB — T4, FREE: Free T4: 1.6 ng/dL (ref 0.8–1.8)

## 2020-02-12 LAB — TSH: TSH: 0.02 mIU/L — ABNORMAL LOW (ref 0.40–4.50)

## 2020-02-16 ENCOUNTER — Ambulatory Visit: Payer: Medicare Other | Admitting: "Endocrinology

## 2020-02-16 DIAGNOSIS — R413 Other amnesia: Secondary | ICD-10-CM | POA: Diagnosis not present

## 2020-02-16 DIAGNOSIS — F329 Major depressive disorder, single episode, unspecified: Secondary | ICD-10-CM | POA: Diagnosis not present

## 2020-02-16 DIAGNOSIS — I10 Essential (primary) hypertension: Secondary | ICD-10-CM | POA: Diagnosis not present

## 2020-02-16 DIAGNOSIS — Z681 Body mass index (BMI) 19 or less, adult: Secondary | ICD-10-CM | POA: Diagnosis not present

## 2020-02-16 DIAGNOSIS — Z13220 Encounter for screening for lipoid disorders: Secondary | ICD-10-CM | POA: Diagnosis not present

## 2020-02-16 DIAGNOSIS — E538 Deficiency of other specified B group vitamins: Secondary | ICD-10-CM | POA: Diagnosis not present

## 2020-02-16 DIAGNOSIS — R600 Localized edema: Secondary | ICD-10-CM | POA: Diagnosis not present

## 2020-02-16 DIAGNOSIS — D649 Anemia, unspecified: Secondary | ICD-10-CM | POA: Diagnosis not present

## 2020-02-19 ENCOUNTER — Ambulatory Visit (INDEPENDENT_AMBULATORY_CARE_PROVIDER_SITE_OTHER): Payer: Medicare Other | Admitting: "Endocrinology

## 2020-02-19 ENCOUNTER — Encounter: Payer: Self-pay | Admitting: "Endocrinology

## 2020-02-19 ENCOUNTER — Other Ambulatory Visit: Payer: Self-pay

## 2020-02-19 VITALS — BP 166/78 | HR 62 | Ht 65.0 in | Wt 143.8 lb

## 2020-02-19 DIAGNOSIS — E890 Postprocedural hypothyroidism: Secondary | ICD-10-CM | POA: Diagnosis not present

## 2020-02-19 MED ORDER — LEVOTHYROXINE SODIUM 88 MCG PO TABS: 88.0000 ug | ORAL_TABLET | Freq: Every day | ORAL | 1 refills | Status: DC

## 2020-02-19 NOTE — Progress Notes (Signed)
02/19/2020       Endocrinology follow-up note   Subjective:    Patient ID: Lauren [name], female    DOB: Mar 25, 1934, PCP Sinda Du, MD   Past Medical History:  Diagnosis Date  . Anxiety disorder   . Asthma   . Asthma   . Atherosclerotic heart disease of native coronary artery without angina pectoris   . Back pain   . Back pain   . Benign neoplasm of unspecified breast   . Chronic obstructive pulmonary disease, unspecified ([place])   . COPD (chronic obstructive pulmonary disease) (HCC)    O2 at night sometimes  . Essential hypertension, benign   . Gastro-esophageal reflux disease without esophagitis   . GERD (gastroesophageal reflux disease)   . History of cardiac catheterization    Reportedly normal coronaries in 2000  . Hyperlipidemia   . Hypertensive heart disease without heart failure   . Hypothyroidism, unspecified   . Insomnia, unspecified   . Major depressive disorder, single episode, unspecified   . Osteoarthritis   . Other fatigue   . Overactive bladder   . Polyosteoarthritis, unspecified   . Rectocele   . Unspecified asthma, uncomplicated   . Unspecified dementia without behavioral disturbance [facility])    Past Surgical History:  Procedure Laterality Date  . ABDOMINAL HYSTERECTOMY  1994  . ANKLE SURGERY     Rod and 13 screws  . APPENDECTOMY    . BREAST BIOPSY  1996   Benign  . COLONOSCOPY  01/27/2011   Normal rectum/Sigmoid diverticula/Multiple colonic polyps removed (TUBULAR ADENOMA) Next TCS 01/2016  . DORSAL COMPARTMENT RELEASE Left 02/03/2013   Procedure: RELEASE DORSAL COMPARTMENT (DEQUERVAIN);  Surgeon: Carole Civil, MD;  Location: [facility];  Service: Orthopedics;  Laterality: Left;  . DORSAL COMPARTMENT RELEASE Right 09/11/2013   Procedure: RELEASE DORSAL COMPARTMENT (DEQUERVAIN);  Surgeon: Carole Civil, MD;  Location: [facility];  Service: Orthopedics;  Laterality: Right;  . ESOPHAGEAL DILATION  08/04/2012   Procedure: ESOPHAGEAL DILATION;   Surgeon: Daneil Dolin, MD;  Location: [facility];  Service: Endoscopy;;  . ESOPHAGOGASTRODUODENOSCOPY  08/21/2003      Normal tubular esophagus status post passage of Maloney dilator as  described above/  2. Multiple linear antral erosions of uncertain significance, could be NSAID  . ESOPHAGOGASTRODUODENOSCOPY  08/04/2012   Procedure: ESOPHAGOGASTRODUODENOSCOPY (EGD);  Surgeon: Daneil Dolin, MD;  Location[facility];  Service: Endoscopy;  Laterality: N/A;  7:30  . ROTATOR CUFF REPAIR    . SHOULDER SURGERY     Rotator Cuff  . TRIGGER FINGER RELEASE Right 09/11/2013   Procedure: RIGHT LONG FINGER RELEASE TRIGGER FINGER;  Surgeon: Carole Civil, MD;  Location: [facility];  Service: Orthopedics;  Laterality: Right;   Social History   Socioeconomic History  . Marital status: Divorced    Spouse name: Not on file  . Number of children: 4  . Years of education: Not on file  . Highest education level: Not on file  Occupational History    Employer: RETIRED  Tobacco Use  . Smoking status: Former Smoker    Packs/day: 0.50    Years: 25.00    Pack years: 12.50    Types: Cigarettes    Quit date: 01/31/1963    Years since quitting: 57.0  . Smokeless tobacco: Never Used  Substance and Sexual Activity  . Alcohol use: No    Alcohol/week: 0.0 standard drinks  . Drug use: No  . Sexual activity: Yes  Birth control/protection: Surgical  Other Topics Concern  . Not on file  Social History Narrative  . Not on file   Social Determinants of Health   Financial Resource Strain:   . Difficulty of Paying Living Expenses:   Food Insecurity:   . Worried About Charity fundraiser in the Last Year:   . Arboriculturist in the Last Year:   Transportation Needs:   . Film/video editor (Medical):   Marland Kitchen Lack of Transportation (Non-Medical):   Physical Activity:   . Days of Exercise per Week:   . Minutes of Exercise per Session:   Stress:   . Feeling of Stress :   Social Connections:    . Frequency of Communication with Friends and Family:   . Frequency of Social Gatherings with Friends and Family:   . Attends Religious Services:   . Active Member of Clubs or Organizations:   . Attends Archivist Meetings:   Marland Kitchen Marital Status:    Outpatient Encounter Medications as of 02/19/2020  Medication Sig  . aspirin 81 MG tablet Take 81 mg by mouth daily.  . Calcium Carbonate-Vitamin D (CALCIUM + D PO) Take 1 tablet by mouth daily.   . DULoxetine (CYMBALTA) 30 MG capsule Take 30 mg by mouth daily.  Marland Kitchen levothyroxine (SYNTHROID) 88 MCG tablet Take 1 tablet (88 mcg total) by mouth daily before breakfast.  . losartan-hydrochlorothiazide (HYZAAR) 100-12.5 MG per tablet Take 1 tablet by mouth daily.  . Memantine HCl-Donepezil HCl (NAMZARIC) 28-10 MG CP24 Take 1 capsule by mouth daily.  . [DISCONTINUED] amLODipine (NORVASC) 10 MG tablet TAKE (1) TABLET BY MOUTH EACH MORNING FOR BLOOD PRESSURE.  . [DISCONTINUED] levothyroxine (SYNTHROID) 100 MCG tablet TAKE ONE TABLET BY MOUTH DAILY BEFORE BREAKFAST.   No facility-administered encounter medications on file as of 02/19/2020.   ALLERGIES: Allergies  Allergen Reactions  . Penicillins Rash and Other (See Comments)    Unknown    VACCINATION STATUS: Immunization History  Administered Date(s) Administered  . Influenza Inj Mdck Quad Pf 09/14/2016  . Influenza Split 08/24/2015  . Influenza, High Dose Seasonal PF 08/23/2015, 08/12/2017, 08/22/2018  . Influenza,inj,Quad PF,6+ Mos 08/28/2019  . Influenza-Unspecified 08/25/2012, 09/27/2013, 08/21/2014  . Pneumococcal Conjugate-13 10/12/2019  . Pneumococcal Polysaccharide-23 08/23/2010, 08/12/2017  . 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 She is on Levothyroxine 100 mcg p.o. daily before breakfast.   - She is accompanied by her  grown daughter who reports she is doing well on her doses now, except that she gets  irritable at times. She has no new complaints, she feels better.  She has steady weight with only mild fluctuation.   -  She denies palpitations, heat intolerance, tremors.    Review of Systems Constitutional: + Minimally fluctuating body weight,-  fatigue,  -subjective hyperthermia Eyes: no blurry vision, no xerophthalmia ENT: no sore throat, no nodules palpated in throat, no dysphagia/odynophagia, no hoarseness Cardiovascular: Chest pain, palpitations, leg swelling.  Musculoskeletal: + Bilateral lower leg swelling, no muscle/joint aches Skin: no rashes Neurological: no tremors, numbness, tingling.  Psychiatric: no depression/anxiety  Objective:    BP (!) 166/78   Pulse 62   Ht 5\' 5"  (1.651 m)   Wt 143 lb 12.8 oz (65.2 kg)   BMI 23.93 kg/m   Wt Readings from Last 3 Encounters:  02/19/20 143 lb 12.8 oz (65.2 kg)  08/10/19 140 lb (63.5 kg)  02/28/18 143 lb (64.9 kg)    Physical Exam   Physical Exam- Limited   Physical Exam- Limited  Constitutional:  Body mass index is 23.93 kg/m. , not in acute distress, normal state of mind Eyes:  EOMI, no exophthalmos Neck: Supple Thyroid: No gross goiter Respiratory: Adequate breathing efforts Musculoskeletal: + Significant pretibial, and pedal swelling bilaterally.    Skin:  no rashes, no hyperemia Neurological: no tremor with outstretched hands,    Recent Results (from the past 2160 hour(s))  T4, Free     Status: None   Collection Time: 02/12/20 10:55 AM  Result Value Ref Range   Free T4 1.6 0.8 - 1.8 ng/dL  TSH     Status: Abnormal   Collection Time: 02/12/20 10:55 AM  Result Value Ref Range   TSH 0.02 (L) 0.40 - 4.50 mIU/L      Assessment & Plan:   1. RAI induced Hypothyroidism - -her thyroid function tests are consistent with slight over replacement.  I discussed and lowered her dose to levothyroxine 88 mcg p.o. daily before breakfast.     - We discussed about the correct intake of her thyroid hormone, on empty  stomach at fasting, with water, separated by at least 30 minutes from breakfast and other medications,  and separated by more than 4 hours from calcium, iron, multivitamins, acid reflux medications (PPIs). -Patient is made aware of the fact that thyroid hormone replacement is needed for life, dose to be adjusted by periodic monitoring of thyroid function tests.   Regarding her bilateral lower extremity edema: It was reported that her amlodipine was switched to metoprolol by her PMD.  She is advised to elevate her legs while sitting and while in bed.  I also suggested compression socks to prevent dependent edema.  - I advised patient to maintain close follow up with Sinda Du, MD for primary care needs.     - Time spent on this patient care encounter:  25 minutes of which 50% was spent in  counseling and the rest reviewing  her current and  previous labs / studies and medications  doses and developing a plan for long term care. Lauren [name]  participated in the discussions, expressed understanding, and voiced agreement with the above plans.  All questions were answered to her satisfaction. she is encouraged to contact clinic should she have any questions or concerns prior to her return visit.   Follow up plan: Return in about 6 months (around 08/21/2020) for Follow up with Pre-visit Labs.  Glade Lloyd, MD Phone: [phone]  Fax: [phone]  -  This note was partially dictated with voice recognition software. Similar sounding words can be transcribed inadequately or may not  be corrected upon review.  02/19/2020, 9:57 AM

## 2020-03-07 DIAGNOSIS — I10 Essential (primary) hypertension: Secondary | ICD-10-CM | POA: Diagnosis not present

## 2020-03-07 DIAGNOSIS — I509 Heart failure, unspecified: Secondary | ICD-10-CM | POA: Diagnosis not present

## 2020-03-07 DIAGNOSIS — F329 Major depressive disorder, single episode, unspecified: Secondary | ICD-10-CM | POA: Diagnosis not present

## 2020-03-07 DIAGNOSIS — Z6822 Body mass index (BMI) 22.0-22.9, adult: Secondary | ICD-10-CM | POA: Diagnosis not present

## 2020-03-15 DIAGNOSIS — I509 Heart failure, unspecified: Secondary | ICD-10-CM | POA: Diagnosis not present

## 2020-03-27 ENCOUNTER — Ambulatory Visit: Payer: Medicare Other | Admitting: Family Medicine

## 2020-03-28 DIAGNOSIS — I509 Heart failure, unspecified: Secondary | ICD-10-CM | POA: Diagnosis not present

## 2020-03-28 DIAGNOSIS — I10 Essential (primary) hypertension: Secondary | ICD-10-CM | POA: Diagnosis not present

## 2020-03-28 DIAGNOSIS — R413 Other amnesia: Secondary | ICD-10-CM | POA: Diagnosis not present

## 2020-03-28 DIAGNOSIS — Z6823 Body mass index (BMI) 23.0-23.9, adult: Secondary | ICD-10-CM | POA: Diagnosis not present

## 2020-05-06 ENCOUNTER — Other Ambulatory Visit: Payer: Self-pay

## 2020-05-06 ENCOUNTER — Ambulatory Visit (INDEPENDENT_AMBULATORY_CARE_PROVIDER_SITE_OTHER): Payer: Medicare Other | Admitting: Internal Medicine

## 2020-05-06 ENCOUNTER — Encounter: Payer: Self-pay | Admitting: Internal Medicine

## 2020-05-06 VITALS — BP 120/60 | HR 60 | Ht 65.0 in | Wt 147.0 lb

## 2020-05-06 DIAGNOSIS — I10 Essential (primary) hypertension: Secondary | ICD-10-CM | POA: Diagnosis not present

## 2020-05-06 NOTE — Progress Notes (Signed)
Cardiology Office Note   Date:  05/06/2020   ID:  [name], DOB 09/22/1934, MRN [id number]  PCP:  Lauren Du, MD  Cardiologist:   Lauren Carnes, MD    Patient referred by Lauren [name] from [facility] for evaluation of CHF   History of Present Illness: Lauren [name] is a 86 y.o. female with a history of hypertension. she is followed at [facility] saw Lauren [name] back in April.  She presented with volume overload to their clinic and is referred to cardiology for treatment of CHF. Review of records, patient in 2015 had an echocardiogram done.  LVEF was normal.  There was mild LVH.  She also had a Myoview done  The patient comes today with her daughter.  She says in the early/mid winter her mom was eating a lot of potato chips and  she started getting swelling in her legs.  She was seen at [facility].  Blood work was done.  Lasix was added (40 mg daily) for 2 weeks and then stopped.  She stopped eating potato chips.  Swelling in her legs went down and has not come back.  She was seen by Lauren [name] at [facility] and referred for evaluation of heart failure.  The patient denies chest pain.  Breathing is okay.  She does chores around the house and has not slowed her activity.  She denies lower extremity edema.  No PND.  No orthopnea.      Current Meds  Medication Sig  . aspirin 81 MG tablet Take 81 mg by mouth daily.  . Calcium Carbonate-Vitamin D (CALCIUM + D PO) Take 1 tablet by mouth daily.   . DULoxetine (CYMBALTA) 30 MG capsule Take 30 mg by mouth daily.  Marland Kitchen levothyroxine (SYNTHROID) 88 MCG tablet Take 1 tablet (88 mcg total) by mouth daily before breakfast.  . losartan-hydrochlorothiazide (HYZAAR) 100-12.5 MG per tablet Take 1 tablet by mouth daily.     Allergies:   Penicillins   Past Medical History:  Diagnosis Date  . Anxiety disorder   . Asthma   . Asthma   . Atherosclerotic heart disease of native coronary artery without  angina pectoris   . Back pain   . Back pain   . Benign neoplasm of unspecified breast   . Chronic obstructive pulmonary disease, unspecified ([place])   . COPD (chronic obstructive pulmonary disease) (HCC)    O2 at night sometimes  . Essential hypertension, benign   . Gastro-esophageal reflux disease without esophagitis   . GERD (gastroesophageal reflux disease)   . History of cardiac catheterization    Reportedly normal coronaries in 2000  . Hyperlipidemia   . Hypertensive heart disease without heart failure   . Hypothyroidism, unspecified   . Insomnia, unspecified   . Major depressive disorder, single episode, unspecified   . Osteoarthritis   . Other fatigue   . Overactive bladder   . Polyosteoarthritis, unspecified   . Rectocele   . Unspecified asthma, uncomplicated   . Unspecified dementia without behavioral disturbance [facility])     Past Surgical History:  Procedure Laterality Date  . ABDOMINAL HYSTERECTOMY  1994  . ANKLE SURGERY     Rod and 13 screws  . APPENDECTOMY    . BREAST BIOPSY  1996   Benign  . COLONOSCOPY  01/27/2011   Normal rectum/Sigmoid diverticula/Multiple colonic polyps removed (TUBULAR ADENOMA) Next TCS 01/2016  . DORSAL COMPARTMENT RELEASE Left 02/03/2013   Procedure: RELEASE DORSAL COMPARTMENT (DEQUERVAIN);  Surgeon:  Carole Civil, MD;  Location: [facility];  Service: Orthopedics;  Laterality: Left;  . DORSAL COMPARTMENT RELEASE Right 09/11/2013   Procedure: RELEASE DORSAL COMPARTMENT (DEQUERVAIN);  Surgeon: Carole Civil, MD;  Location: [facility];  Service: Orthopedics;  Laterality: Right;  . ESOPHAGEAL DILATION  08/04/2012   Procedure: ESOPHAGEAL DILATION;  Surgeon: Daneil Dolin, MD;  Location[facility];  Service: Endoscopy;;  . ESOPHAGOGASTRODUODENOSCOPY  08/21/2003      Normal tubular esophagus status post passage of Maloney dilator as  described above/  2. Multiple linear antral erosions of uncertain significance, could be NSAID  .  ESOPHAGOGASTRODUODENOSCOPY  08/04/2012   Procedure: ESOPHAGOGASTRODUODENOSCOPY (EGD);  Surgeon: Daneil Dolin, MD;  [facility];  Service: Endoscopy;  Laterality: N/A;  7:30  . ROTATOR CUFF REPAIR    . SHOULDER SURGERY     Rotator Cuff  . TRIGGER FINGER RELEASE Right 09/11/2013   Procedure: RIGHT LONG FINGER RELEASE TRIGGER FINGER;  Surgeon: Carole Civil, MD;  Location: [facility];  Service: Orthopedics;  Laterality: Right;     Social History:  The patient  reports that she quit smoking about 57 years ago. Her smoking use included cigarettes. She has a 12.50 pack-year smoking history. She has never used smokeless tobacco. She reports that she does not drink alcohol and does not use drugs.   Family History:  The patient's family history includes Arthritis in an other family member; Asthma in an other family member; Kidney failure (age of onset: 49) in her father; Tuberculosis (age of onset: 13) in her mother.    ROS:  Please see the history of present illness. All other systems are reviewed and  Negative to the above problem except as noted.    PHYSICAL EXAM: VS:  BP 120/60   Pulse 60   Ht 5\' 5"  (1.651 m)   Wt 147 lb (66.7 kg)   SpO2 97%   BMI 24.46 kg/m   GEN: Well nourished, well developed, in no acute distress  HEENT: normal  Neck: no JVD, no carotid bruits, Cardiac: RRR; no murmurs, rubs, or gallops,no edema  Respiratory:  clear to auscultation bilaterally, normal work of breathing GI: soft, nontender, nondistended, + BS  No hepatomegaly  MS: no deformity Moving all extremities   Skin: warm and dry, no rash Neuro:  Strength and sensation are intact Psych: euthymic mood, full affect   EKG:  EKG is ordered today.  Sinus rhythm.  60 bpm.  First-degree AV block with a PR interval of 224 ms.  Anteroseptal infarct.  T wave inversion V2 through V4 biphasic V5 1.   Lipid Panel    Component Value Date/Time   CHOL 232 (A) 07/19/2019 0000   TRIG 56 07/19/2019 0000     HDL 80 (A) 07/19/2019 0000   LDLCALC 137 07/19/2019 0000      Wt Readings from Last 3 Encounters:  05/06/20 147 lb (66.7 kg)  02/19/20 143 lb 12.8 oz (65.2 kg)  08/10/19 140 lb (63.5 kg)      ASSESSMENT AND PLAN:  1.  Lower extremity edema.  I think this most likely was related to excess salt intake.  It has resolved.  She has cut out the challenge of potato chips.  She denies shortness of breath and chest pressure.  I would not pursue further work-up since she is doing so well.  2 hypertension.  Adequate control continue meds  3 renal.  Will review labs from [place].  We  will set to see the patient as needed.  No definite follow-up planned unless symptoms develop.  Current medicines are reviewed at length with the patient today.  The patient does not have concerns regarding medicines.  Signed, Lauren Carnes, MD  05/06/2020 9:41 AM    [address], [place], [place]  [address] Phone: [phone]; Fax: [phone]

## 2020-05-06 NOTE — Patient Instructions (Signed)
Medication Instructions:  Your physician recommends that you continue on your current medications as directed. Please refer to the Current Medication list given to you today.  *If you need a refill on your cardiac medications before your next appointment, please call your pharmacy*   Lab Work: NONE   If you have labs (blood work) drawn today and your tests are completely normal, you will receive your results only by: MyChart Message (if you have MyChart) OR A paper copy in the mail If you have any lab test that is abnormal or we need to change your treatment, we will call you to review the results.   Testing/Procedures: NONE    Follow-Up: At [facility], you and your health needs are our priority.  As part of our continuing mission to provide you with exceptional heart care, we have created designated Provider Care Teams.  These Care Teams include your primary Cardiologist (physician) and Advanced Practice Providers (APPs -  Physician Assistants and Nurse Practitioners) who all work together to provide you with the care you need, when you need it.  We recommend signing up for the patient portal called "MyChart".  Sign up information is provided on this After Visit Summary.  MyChart is used to connect with patients for Virtual Visits (Telemedicine).  Patients are able to view lab/test results, encounter notes, upcoming appointments, etc.  Non-urgent messages can be sent to your provider as well.   To learn more about what you can do with MyChart, go to [email or web address].    Your next appointment:    As Needed   The format for your next appointment:   In Person  Provider:   Paula Ross, MD   Other Instructions Thank you for choosing [facility]!    

## 2020-05-16 ENCOUNTER — Encounter: Payer: Self-pay | Admitting: *Deleted

## 2020-05-16 ENCOUNTER — Other Ambulatory Visit: Payer: Self-pay | Admitting: *Deleted

## 2020-05-17 ENCOUNTER — Other Ambulatory Visit: Payer: Self-pay | Admitting: "Endocrinology

## 2020-05-21 ENCOUNTER — Encounter: Payer: Self-pay | Admitting: Diagnostic Neuroimaging

## 2020-05-21 ENCOUNTER — Ambulatory Visit (INDEPENDENT_AMBULATORY_CARE_PROVIDER_SITE_OTHER): Payer: Medicare Other | Admitting: Diagnostic Neuroimaging

## 2020-05-21 VITALS — BP 149/82 | HR 55 | Ht 64.5 in | Wt 146.2 lb

## 2020-05-21 DIAGNOSIS — F0391 Unspecified dementia with behavioral disturbance: Secondary | ICD-10-CM | POA: Diagnosis not present

## 2020-05-21 DIAGNOSIS — F03B18 Unspecified dementia, moderate, with other behavioral disturbance: Secondary | ICD-10-CM

## 2020-05-21 DIAGNOSIS — R413 Other amnesia: Secondary | ICD-10-CM

## 2020-05-21 NOTE — Progress Notes (Signed)
[facility]  PATIENT: Lauren [name] DOB: 14-Aug-1934  REFERRING CLINICIAN: Redmond School, MD HISTORY FROM: patient and daughter  REASON FOR VISIT: new consult    HISTORICAL  CHIEF COMPLAINT:  Chief Complaint  Patient presents with  . Amnesia, memory    rm 6, New Pt, dgtrRosaria Ferries  MMSE 12    HISTORY OF PRESENT ILLNESS:   86 year old female here for evaluation of dementia.  Patient has had gradual onset progressive short-term memory loss confusion since 2017.  She was on Namenda for a couple of years but then discontinued due to lack of effectiveness.  Patient lives alone.  She has great support from her family, neighbors and friends.  Patient now repeating her self.  She is getting more confused.  Having difficulty taking medications.  No longer driving.  She has become more withdrawn, decreased socializing, easily frustrated.   REVIEW OF SYSTEMS: Full 14 system review of systems performed and negative with exception of: As per HPI.  ALLERGIES: Allergies  Allergen Reactions  . Penicillins Rash and Other (See Comments)    Unknown 05/21/20 pt denies    HOME MEDICATIONS: Outpatient Medications Prior to Visit  Medication Sig Dispense Refill  . aspirin 81 MG tablet Take 81 mg by mouth daily.    . Calcium Carbonate-Vitamin D (CALCIUM + D PO) Take 1 tablet by mouth daily.     . DULoxetine (CYMBALTA) 60 MG capsule Take 60 mg by mouth daily.    Marland Kitchen levothyroxine (SYNTHROID) 88 MCG tablet TAKE 1 TABLET BEFORE BREAKFAST. 90 tablet 0  . losartan-hydrochlorothiazide (HYZAAR) 100-25 MG tablet Take 1 tablet by mouth daily.    . furosemide (LASIX) 20 MG tablet Take 20 mg by mouth 2 (two) times daily. (Patient not taking: Reported on 05/21/2020)     No facility-administered medications prior to visit.    PAST MEDICAL HISTORY: Past Medical History:  Diagnosis Date  . Anxiety disorder   . Asthma   . Atherosclerotic heart disease of native coronary artery without  angina pectoris   . Back pain   . Benign neoplasm of unspecified breast   . Chronic obstructive pulmonary disease, unspecified ([place])   . COPD (chronic obstructive pulmonary disease) (HCC)    O2 at night sometimes  . Essential hypertension, benign   . Gastro-esophageal reflux disease without esophagitis   . GERD (gastroesophageal reflux disease)   . History of cardiac catheterization    Reportedly normal coronaries in 2000  . Hyperlipidemia   . Hypertensive heart disease without heart failure   . Hypothyroidism, unspecified   . Insomnia, unspecified   . Major depressive disorder, single episode, unspecified   . Osteoarthritis   . Other fatigue   . Overactive bladder   . Polyosteoarthritis, unspecified   . Rectocele   . Unspecified asthma, uncomplicated   . Unspecified dementia without behavioral disturbance ([place])     PAST SURGICAL HISTORY: Past Surgical History:  Procedure Laterality Date  . ABDOMINAL HYSTERECTOMY  1994  . ANKLE SURGERY     Rod and 13 screws  . APPENDECTOMY    . BREAST BIOPSY  1996   Benign  . COLONOSCOPY  01/27/2011   Normal rectum/Sigmoid diverticula/Multiple colonic polyps removed (TUBULAR ADENOMA) Next TCS 01/2016  . DORSAL COMPARTMENT RELEASE Left 02/03/2013   Procedure: RELEASE DORSAL COMPARTMENT (DEQUERVAIN);  Surgeon: Carole Civil, MD;  Location: [facility];  Service: Orthopedics;  Laterality: Left;  . DORSAL COMPARTMENT RELEASE Right 09/11/2013   Procedure: RELEASE DORSAL COMPARTMENT (DEQUERVAIN);  Surgeon: Carole Civil, MD;  Location: [facility];  Service: Orthopedics;  Laterality: Right;  . ESOPHAGEAL DILATION  08/04/2012   Procedure: ESOPHAGEAL DILATION;  Surgeon: Daneil Dolin, MD;  [facility];  Service: Endoscopy;;  . ESOPHAGOGASTRODUODENOSCOPY  08/21/2003      Normal tubular esophagus status post passage of Maloney dilator as  described above/  2. Multiple linear antral erosions of uncertain significance, could be NSAID  .  ESOPHAGOGASTRODUODENOSCOPY  08/04/2012   Procedure: ESOPHAGOGASTRODUODENOSCOPY (EGD);  Surgeon: Daneil Dolin, MD;  [facility];  Service: Endoscopy;  Laterality: N/A;  7:30  . ROTATOR CUFF REPAIR    . SHOULDER SURGERY     Rotator Cuff  . TRIGGER FINGER RELEASE Right 09/11/2013   Procedure: RIGHT LONG FINGER RELEASE TRIGGER FINGER;  Surgeon: Carole Civil, MD;  Location: [facility];  Service: Orthopedics;  Laterality: Right;    FAMILY HISTORY: Family History  Problem Relation Age of Onset  . Tuberculosis Mother 92       Deceased  . Kidney failure Father 48  . Asthma Other   . Arthritis Other   . Prostate cancer Son   . Colon cancer Neg Hx     SOCIAL HISTORY: Social History   Socioeconomic History  . Marital status: Divorced    Spouse name: Not on file  . Number of children: 4  . Years of education: 63  . Highest education level: Not on file  Occupational History    Employer: RETIRED  Tobacco Use  . Smoking status: Former Smoker    Packs/day: 0.50    Years: 25.00    Pack years: 12.50    Types: Cigarettes    Quit date: 01/31/1963    Years since quitting: 57.3  . Smokeless tobacco: Never Used  Vaping Use  . Vaping Use: Never used  Substance and Sexual Activity  . Alcohol use: No    Alcohol/week: 0.0 standard drinks  . Drug use: No  . Sexual activity: Yes    Birth control/protection: Surgical  Other Topics Concern  . Not on file  Social History Narrative   05/21/20 lives with daughter, son alternating   Social Determinants of Health   Financial Resource Strain:   . Difficulty of Paying Living Expenses:   Food Insecurity:   . Worried About Charity fundraiser in the Last Year:   . Arboriculturist in the Last Year:   Transportation Needs:   . Film/video editor (Medical):   Marland Kitchen Lack of Transportation (Non-Medical):   Physical Activity:   . Days of Exercise per Week:   . Minutes of Exercise per Session:   Stress:   . Feeling of Stress :    Social Connections:   . Frequency of Communication with Friends and Family:   . Frequency of Social Gatherings with Friends and Family:   . Attends Religious Services:   . Active Member of Clubs or Organizations:   . Attends Archivist Meetings:   Marland Kitchen Marital Status:   Intimate Partner Violence:   . Fear of Current or Ex-Partner:   . Emotionally Abused:   Marland Kitchen Physically Abused:   . Sexually Abused:      PHYSICAL EXAM  GENERAL EXAM/CONSTITUTIONAL: Vitals:  Vitals:   05/21/20 1537  BP: (!) 149/82  Pulse: (!) 55  Weight: 146 lb 3.2 oz (66.3 kg)  Height: 5' 4.5" (1.638 m)     Body mass index is 24.71 kg/m. Wt Readings  from Last 3 Encounters:  05/21/20 146 lb 3.2 oz (66.3 kg)  05/06/20 147 lb (66.7 kg)  02/19/20 143 lb 12.8 oz (65.2 kg)     Patient is in no distress; well developed, nourished and groomed; neck is supple  CARDIOVASCULAR:  Examination of carotid arteries is normal; no carotid bruits  Regular rate and rhythm, no murmurs  Examination of peripheral vascular system by observation and palpation is normal  EYES:  Ophthalmoscopic exam of optic discs and posterior segments is normal; no papilledema or hemorrhages  No exam data present  MUSCULOSKELETAL:  Gait, strength, tone, movements noted in Neurologic exam below  NEUROLOGIC: MENTAL STATUS:  MMSE - Mini Mental State Exam 05/21/2020  Orientation to time 0  Orientation to Place 2  Registration 3  Attention/ Calculation 1  Recall 0  Language- name 2 objects 2  Language- repeat 0  Language- follow 3 step command 2  Language- follow 3 step command-comments didn't fold paper  Language- read & follow direction 1  Write a sentence 1  Copy design 0  Total score 12    awake, alert, oriented to person, place and time  recent and remote memory intact  normal attention and concentration  language fluent, comprehension intact, naming intact  fund of knowledge appropriate  CRANIAL  NERVE:   2nd - no papilledema on fundoscopic exam  2nd, 3rd, 4th, 6th - pupils equal and reactive to light, visual fields full to confrontation, extraocular muscles intact, no nystagmus  5th - facial sensation symmetric  7th - facial strength symmetric  8th - hearing intact  9th - palate elevates symmetrically, uvula midline  11th - shoulder shrug symmetric  12th - tongue protrusion midline  MOTOR:   normal bulk and tone, full strength in the BUE, BLE  SENSORY:   normal and symmetric to light touch, pinprick, temperature, vibration  COORDINATION:   finger-nose-finger, fine finger movements normal  REFLEXES:   deep tendon reflexes present and symmetric  GAIT/STATION:   narrow based gait     DIAGNOSTIC DATA (LABS, IMAGING, TESTING) - I reviewed patient records, labs, notes, testing and imaging myself where available.  Lab Results  Component Value Date   WBC 5.1 07/19/2019   HGB 11.0 (A) 07/19/2019   HCT 34 (A) 07/19/2019   MCV 85.5 09/06/2013   PLT 225 07/19/2019      Component Value Date/Time   NA 140 07/19/2019 0000   K 4.9 07/19/2019 0000   CL 104 07/19/2019 0000   CO2 104 (A) 07/19/2019 0000   GLUCOSE 100 (H) 09/06/2013 1315   BUN 22 (A) 07/19/2019 0000   CREATININE 1.3 (A) 07/19/2019 0000   CREATININE 0.98 09/06/2013 1315   CALCIUM 9.5 07/19/2019 0000   ALBUMIN 4.2 07/19/2019 0000   AST 23 07/19/2019 0000   ALT 16 07/19/2019 0000   ALKPHOS 64 07/19/2019 0000   GFRNONAA 39 07/19/2019 0000   GFRAA 46 07/19/2019 0000   Lab Results  Component Value Date   CHOL 232 (A) 07/19/2019   HDL 80 (A) 07/19/2019   LDLCALC 137 07/19/2019   TRIG 56 07/19/2019   No results found for: HGBA1C Lab Results  Component Value Date   VFIEPPIR51 884 03/05/2015   Lab Results  Component Value Date   TSH 0.02 (L) 02/12/2020      ASSESSMENT AND PLAN  86 y.o. year old female here with:  Dx:  1. Moderate dementia with behavioral disturbance ([place])    2. Memory loss  PLAN:  MEMORY LOSS / MODERATE DEMENTIA - check MRI brain and B12; rule out secondary causes - safety / supervision issues reviewed - caregiver resources provided - no driving; caution with living alone - continue cymbalta  Orders Placed This Encounter  Procedures  . MR BRAIN WO CONTRAST  . Vitamin B12   Return for pending if symptoms worsen or fail to improve, return to PCP.    Penni Bombard, MD 0/25/4270, 6:23 PM Certified in Neurology, Neurophysiology and Neuroimaging  [facility] [address] [place], [place] [address] [phone]

## 2020-05-21 NOTE — Patient Instructions (Signed)
  MEMORY LOSS / MODERATE DEMENTIA - check MRI brain, B12 level - consider memantine 10mg  twice a day  - safety / supervision issues reviewed - caregiver resources provided - no driving; caution with living alone - continue cymbalta

## 2020-05-22 ENCOUNTER — Telehealth: Payer: Self-pay | Admitting: *Deleted

## 2020-05-22 LAB — VITAMIN B12: Vitamin B-12: 521 pg/mL (ref 232–1245)

## 2020-05-22 NOTE — Telephone Encounter (Signed)
Spoke with daughter, [name] who was with patient in office visit. Advised her the Vit B12 lab is normal. She verbalized understanding, appreciation.

## 2020-05-28 ENCOUNTER — Telehealth: Payer: Self-pay | Admitting: Diagnostic Neuroimaging

## 2020-05-28 NOTE — Telephone Encounter (Signed)
Medicare/bcbs supp no auth. Patient is scheduled at [facility] for 06/25/20 arrival time is 10:30 AM. I left a voicemail informing the patient this and left their number of [phone] incase she needed to r/s.

## 2020-06-25 ENCOUNTER — Other Ambulatory Visit: Payer: Self-pay

## 2020-06-25 ENCOUNTER — Ambulatory Visit (HOSPITAL_COMMUNITY)
Admission: RE | Admit: 2020-06-25 | Discharge: 2020-06-25 | Disposition: A | Discharge status: Home / Self Care | Payer: Medicare Other | Source: Ambulatory Visit | Attending: Diagnostic Neuroimaging | Admitting: Diagnostic Neuroimaging

## 2020-06-25 DIAGNOSIS — R413 Other amnesia: Secondary | ICD-10-CM | POA: Insufficient documentation

## 2020-07-01 ENCOUNTER — Telehealth: Payer: Self-pay | Admitting: *Deleted

## 2020-07-01 NOTE — Telephone Encounter (Signed)
Spoke with daughter, [name] on [place] and informed her the MRI brain showed mild chronic infarcts and atrophy. No acute findings. She asked to be sent to PCP. I advised her he can  see her result sin EMR. She  verbalized understanding, appreciation.

## 2020-08-16 DIAGNOSIS — E890 Postprocedural hypothyroidism: Secondary | ICD-10-CM | POA: Diagnosis not present

## 2020-08-17 LAB — TSH: TSH: 7.81 mIU/L — ABNORMAL HIGH (ref 0.40–4.50)

## 2020-08-17 LAB — T4, FREE: Free T4: 0.9 ng/dL (ref 0.8–1.8)

## 2020-08-22 ENCOUNTER — Ambulatory Visit (INDEPENDENT_AMBULATORY_CARE_PROVIDER_SITE_OTHER): Payer: Medicare Other | Admitting: Nurse Practitioner

## 2020-08-22 ENCOUNTER — Other Ambulatory Visit: Payer: Self-pay

## 2020-08-22 ENCOUNTER — Encounter: Payer: Self-pay | Admitting: Nurse Practitioner

## 2020-08-22 VITALS — BP 144/73 | HR 88 | Ht 64.5 in | Wt 150.4 lb

## 2020-08-22 DIAGNOSIS — E890 Postprocedural hypothyroidism: Secondary | ICD-10-CM

## 2020-08-22 MED ORDER — LEVOTHYROXINE SODIUM 100 MCG PO TABS: 100.0000 ug | ORAL_TABLET | Freq: Every day | ORAL | 3 refills | Status: DC

## 2020-08-22 NOTE — Progress Notes (Signed)
08/22/2020       Endocrinology follow-up note   Subjective:    Patient ID: Lauren [name], female    DOB: 02-06-1934, PCP Redmond School, MD   Past Medical History:  Diagnosis Date  . Anxiety disorder   . Asthma   . Atherosclerotic heart disease of native coronary artery without angina pectoris   . Back pain   . Benign neoplasm of unspecified breast   . Chronic obstructive pulmonary disease, unspecified ([place])   . COPD (chronic obstructive pulmonary disease) (HCC)    O2 at night sometimes  . Essential hypertension, benign   . Gastro-esophageal reflux disease without esophagitis   . GERD (gastroesophageal reflux disease)   . History of cardiac catheterization    Reportedly normal coronaries in 2000  . Hyperlipidemia   . Hypertensive heart disease without heart failure   . Hypothyroidism, unspecified   . Insomnia, unspecified   . Major depressive disorder, single episode, unspecified   . Osteoarthritis   . Other fatigue   . Overactive bladder   . Polyosteoarthritis, unspecified   . Rectocele   . Unspecified asthma, uncomplicated   . Unspecified dementia without behavioral disturbance [facility])    Past Surgical History:  Procedure Laterality Date  . ABDOMINAL HYSTERECTOMY  1994  . ANKLE SURGERY     Rod and 13 screws  . APPENDECTOMY    . BREAST BIOPSY  1996   Benign  . COLONOSCOPY  01/27/2011   Normal rectum/Sigmoid diverticula/Multiple colonic polyps removed (TUBULAR ADENOMA) Next TCS 01/2016  . DORSAL COMPARTMENT RELEASE Left 02/03/2013   Procedure: RELEASE DORSAL COMPARTMENT (DEQUERVAIN);  Surgeon: Carole Civil, MD;  Location: [facility];  Service: Orthopedics;  Laterality: Left;  . DORSAL COMPARTMENT RELEASE Right 09/11/2013   Procedure: RELEASE DORSAL COMPARTMENT (DEQUERVAIN);  Surgeon: Carole Civil, MD;  Location: [facility];  Service: Orthopedics;  Laterality: Right;  . ESOPHAGEAL DILATION  08/04/2012   Procedure: ESOPHAGEAL DILATION;  Surgeon: Daneil Dolin,  MD;  [facility];  Service: Endoscopy;;  . ESOPHAGOGASTRODUODENOSCOPY  08/21/2003      Normal tubular esophagus status post passage of Maloney dilator as  described above/  2. Multiple linear antral erosions of uncertain significance, could be NSAID  . ESOPHAGOGASTRODUODENOSCOPY  08/04/2012   Procedure: ESOPHAGOGASTRODUODENOSCOPY (EGD);  Surgeon: Daneil Dolin, MD;  [facility];  Service: Endoscopy;  Laterality: N/A;  7:30  . ROTATOR CUFF REPAIR    . SHOULDER SURGERY     Rotator Cuff  . TRIGGER FINGER RELEASE Right 09/11/2013   Procedure: RIGHT LONG FINGER RELEASE TRIGGER FINGER;  Surgeon: Carole Civil, MD;  Location: [facility];  Service: Orthopedics;  Laterality: Right;   Social History   Socioeconomic History  . Marital status: Divorced    Spouse name: Not on file  . Number of children: 4  . Years of education: 62  . Highest education level: Not on file  Occupational History    Employer: RETIRED  Tobacco Use  . Smoking status: Former Smoker    Packs/day: 0.50    Years: 25.00    Pack years: 12.50    Types: Cigarettes    Quit date: 01/31/1963    Years since quitting: 57.5  . Smokeless tobacco: Never Used  Vaping Use  . Vaping Use: Never used  Substance and Sexual Activity  . Alcohol use: No    Alcohol/week: 0.0 standard drinks  . Drug use: No  . Sexual activity: Yes    Birth control/protection:  Surgical  Other Topics Concern  . Not on file  Social History Narrative   05/21/20 lives with daughter, son alternating   Social Determinants of Health   Financial Resource Strain:   . Difficulty of Paying Living Expenses: Not on file  Food Insecurity:   . Worried About Charity fundraiser in the Last Year: Not on file  . Ran Out of Food in the Last Year: Not on file  Transportation Needs:   . Lack of Transportation (Medical): Not on file  . Lack of Transportation (Non-Medical): Not on file  Physical Activity:   . Days of Exercise per Week: Not on  file  . Minutes of Exercise per Session: Not on file  Stress:   . Feeling of Stress : Not on file  Social Connections:   . Frequency of Communication with Friends and Family: Not on file  . Frequency of Social Gatherings with Friends and Family: Not on file  . Attends Religious Services: Not on file  . Active Member of Clubs or Organizations: Not on file  . Attends Archivist Meetings: Not on file  . Marital Status: Not on file   Outpatient Encounter Medications as of 08/22/2020  Medication Sig  . aspirin 81 MG tablet Take 81 mg by mouth daily.  Marland Kitchen docusate sodium (COLACE) 100 MG capsule Take 100 mg by mouth 2 (two) times daily.  . DULoxetine (CYMBALTA) 60 MG capsule Take 60 mg by mouth daily.  . ferrous sulfate 325 (65 FE) MG tablet Take 325 mg by mouth daily with breakfast.  . levothyroxine (SYNTHROID) 88 MCG tablet TAKE 1 TABLET BEFORE BREAKFAST.  Marland Kitchen losartan-hydrochlorothiazide (HYZAAR) 100-25 MG tablet Take 1 tablet by mouth daily.  . [DISCONTINUED] Calcium Carbonate-Vitamin D (CALCIUM + D PO) Take 1 tablet by mouth daily.   . [DISCONTINUED] furosemide (LASIX) 20 MG tablet Take 20 mg by mouth 2 (two) times daily. (Patient not taking: Reported on 05/21/2020)   No facility-administered encounter medications on file as of 08/22/2020.   ALLERGIES: Allergies  Allergen Reactions  . Penicillins Rash and Other (See Comments)    Unknown 05/21/20 pt denies   VACCINATION STATUS: Immunization History  Administered Date(s) Administered  . Influenza Inj Mdck Quad Pf 09/14/2016  . Influenza Split 08/24/2015  . Influenza, High Dose Seasonal PF 08/23/2015, 08/12/2017, 08/22/2018  . Influenza,inj,Quad PF,6+ Mos 08/28/2019  . Influenza-Unspecified 08/25/2012, 09/27/2013, 08/21/2014  . Pneumococcal Conjugate-13 10/12/2019  . Pneumococcal Polysaccharide-23 08/23/2010, 08/12/2017  . Zoster Recombinat (Shingrix) 11/07/2019    Thyroid Problem Presents for follow-up visit. Symptoms  include weight gain. Patient reports no anxiety, cold intolerance, constipation, depressed mood, diarrhea, fatigue, hair loss, heat intolerance, leg swelling, palpitations, tremors or weight loss. The symptoms have been stable.   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 She is on Levothyroxine 88 mcg p.o. daily before breakfast.   - She is accompanied by her grown daughter, her primary caregiver due to her dementia, who reports she is doing well on her doses now.  She has no new complaints, she feels better.  She has steady weight with only mild fluctuation.   -  She denies palpitations, heat intolerance, tremors.    Review of systems  Constitutional: + minimal weight gain,  current Body mass index is 25.42 kg/m. , no fatigue, no subjective hyperthermia, no subjective hypothermia Eyes: no blurry vision, no xerophthalmia ENT: no sore throat, no nodules palpated in throat, no dysphagia/odynophagia, no hoarseness Cardiovascular:  no chest pain, no shortness of breath, no palpitations, no leg swelling Respiratory: no cough, no shortness of breath Gastrointestinal: no nausea/vomiting/diarrhea Musculoskeletal: no muscle/joint aches Skin: no rashes, no hyperemia Neurological: no tremors, no numbness, no tingling, no dizziness Psychiatric: no depression, no anxiety, + dementia with short term memory deficits  Objective:    BP (!) 144/73 (BP Location: Left Arm, Patient Position: Sitting)   Pulse 88   Ht 5' 4.5" (1.638 m)   Wt 150 lb 6.4 oz (68.2 kg)   BMI 25.42 kg/m   Wt Readings from Last 3 Encounters:  08/22/20 150 lb 6.4 oz (68.2 kg)  05/21/20 146 lb 3.2 oz (66.3 kg)  05/06/20 147 lb (66.7 kg)    BP Readings from Last 3 Encounters:  08/22/20 (!) 144/73  05/21/20 (!) 149/82  05/06/20 120/60     Physical Exam- Limited  Constitutional:  Body mass index is 25.42 kg/m. , not in acute distress, normal state of mind Eyes:  EOMI, no exophthalmos Neck: Supple Thyroid:  No gross goiter Cardiovascular: RRR, no murmers, rubs, or gallops, no edema Respiratory: Adequate breathing efforts, no crackles, rales, rhonchi, or wheezing Musculoskeletal: no gross deformities, strength intact in all four extremities, no gross restriction of joint movements Skin:  no rashes, no hyperemia Neurological: no tremor with outstretched hands   Recent Results (from the past 2160 hour(s))  TSH     Status: Abnormal   Collection Time: 08/16/20 12:00 AM  Result Value Ref Range   TSH 7.81 (H) 0.40 - 4.50 mIU/L  T4, free     Status: None   Collection Time: 08/16/20 12:00 AM  Result Value Ref Range   Free T4 0.9 0.8 - 1.8 ng/dL      Assessment & Plan:   1. RAI induced Hypothyroidism - -Her previsit thyroid function tests are consistent with under-replacement.  She is advised to increase her dose of Levothyroxine to 100 mcg po daily before breakfast.   - We discussed about the correct intake of her thyroid hormone, on empty stomach at fasting, with water, separated by at least 30 minutes from breakfast and other medications,  and separated by more than 4 hours from calcium, iron, multivitamins, acid reflux medications (PPIs). -Patient is made aware of the fact that thyroid hormone replacement is needed for life, dose to be adjusted by periodic monitoring of thyroid function tests.    - I advised patient to maintain close follow up with Redmond School, MD for primary care needs.      - Time spent on this patient care encounter:  20 minutes of which 50% was spent in  counseling and the rest reviewing  her current and  previous labs / studies and medications  doses and developing a plan for long term care. Lauren [name]  participated in the discussions, expressed understanding, and voiced agreement with the above plans.  All questions were answered to her satisfaction. she is encouraged to contact clinic should she have any questions or concerns prior to her return  visit.   Follow up plan: Return in about 4 months (around 12/22/2020) for Thyroid follow up, Previsit labs, Virtual visit ok.  Lauren [name], [facility] [address] [place], [place] [address] Phone: [phone] Fax: [phone]  08/22/2020, 9:46 AM

## 2020-08-22 NOTE — Patient Instructions (Signed)

## 2020-09-05 ENCOUNTER — Other Ambulatory Visit: Payer: Self-pay | Admitting: "Endocrinology

## 2020-09-05 MED ORDER — LEVOTHYROXINE SODIUM 112 MCG PO TABS
112.0000 ug | ORAL_TABLET | Freq: Every day | ORAL | 1 refills | Status: DC
Start: 2020-09-05 — End: 2020-12-24

## 2020-09-26 DIAGNOSIS — Z23 Encounter for immunization: Secondary | ICD-10-CM | POA: Diagnosis not present

## 2020-11-01 DIAGNOSIS — I10 Essential (primary) hypertension: Secondary | ICD-10-CM | POA: Diagnosis not present

## 2020-11-01 DIAGNOSIS — F0390 Unspecified dementia without behavioral disturbance: Secondary | ICD-10-CM | POA: Diagnosis not present

## 2020-11-01 DIAGNOSIS — Z1331 Encounter for screening for depression: Secondary | ICD-10-CM | POA: Diagnosis not present

## 2020-11-01 DIAGNOSIS — Z0000 Encounter for general adult medical examination without abnormal findings: Secondary | ICD-10-CM | POA: Diagnosis not present

## 2020-11-01 DIAGNOSIS — E063 Autoimmune thyroiditis: Secondary | ICD-10-CM | POA: Diagnosis not present

## 2020-11-01 DIAGNOSIS — Z6824 Body mass index (BMI) 24.0-24.9, adult: Secondary | ICD-10-CM | POA: Diagnosis not present

## 2020-11-04 DIAGNOSIS — E039 Hypothyroidism, unspecified: Secondary | ICD-10-CM | POA: Diagnosis not present

## 2020-11-04 DIAGNOSIS — Z1389 Encounter for screening for other disorder: Secondary | ICD-10-CM | POA: Diagnosis not present

## 2020-11-04 DIAGNOSIS — Z0000 Encounter for general adult medical examination without abnormal findings: Secondary | ICD-10-CM | POA: Diagnosis not present

## 2020-11-04 DIAGNOSIS — Z6824 Body mass index (BMI) 24.0-24.9, adult: Secondary | ICD-10-CM | POA: Diagnosis not present

## 2020-12-16 DIAGNOSIS — E890 Postprocedural hypothyroidism: Secondary | ICD-10-CM | POA: Diagnosis not present

## 2020-12-17 LAB — T4, FREE: Free T4: 1.75 ng/dL (ref 0.82–1.77)

## 2020-12-17 LAB — TSH: TSH: 0.339 u[IU]/mL — ABNORMAL LOW (ref 0.450–4.500)

## 2020-12-23 NOTE — Patient Instructions (Signed)

## 2020-12-24 ENCOUNTER — Ambulatory Visit (INDEPENDENT_AMBULATORY_CARE_PROVIDER_SITE_OTHER): Payer: Medicare Other | Admitting: Nurse Practitioner

## 2020-12-24 ENCOUNTER — Other Ambulatory Visit: Payer: Self-pay

## 2020-12-24 ENCOUNTER — Encounter: Payer: Self-pay | Admitting: Nurse Practitioner

## 2020-12-24 VITALS — BP 150/81 | HR 61 | Ht 64.5 in | Wt 149.6 lb

## 2020-12-24 DIAGNOSIS — E890 Postprocedural hypothyroidism: Secondary | ICD-10-CM

## 2020-12-24 MED ORDER — LEVOTHYROXINE SODIUM 88 MCG PO TABS: 88.0000 ug | ORAL_TABLET | Freq: Every day | ORAL | 3 refills | Status: DC

## 2020-12-24 NOTE — Progress Notes (Signed)
12/24/2020       Endocrinology follow-up note   Subjective:    Patient ID: Lauren [name], female    DOB: [birth year], PCP Redmond School, MD   Past Medical History:  Diagnosis Date  . Anxiety disorder   . Asthma   . Atherosclerotic heart disease of native coronary artery without angina pectoris   . Back pain   . Benign neoplasm of unspecified breast   . Chronic obstructive pulmonary disease, unspecified ([place])   . COPD (chronic obstructive pulmonary disease) (HCC)    O2 at night sometimes  . Essential hypertension, benign   . Gastro-esophageal reflux disease without esophagitis   . GERD (gastroesophageal reflux disease)   . History of cardiac catheterization    Reportedly normal coronaries in 2000  . Hyperlipidemia   . Hypertensive heart disease without heart failure   . Hypothyroidism, unspecified   . Insomnia, unspecified   . Major depressive disorder, single episode, unspecified   . Osteoarthritis   . Other fatigue   . Overactive bladder   . Polyosteoarthritis, unspecified   . Rectocele   . Unspecified asthma, uncomplicated   . Unspecified dementia without behavioral disturbance [facility])    Past Surgical History:  Procedure Laterality Date  . ABDOMINAL HYSTERECTOMY  1994  . ANKLE SURGERY     Rod and 13 screws  . APPENDECTOMY    . BREAST BIOPSY  1996   Benign  . COLONOSCOPY  01/27/2011   Normal rectum/Sigmoid diverticula/Multiple colonic polyps removed (TUBULAR ADENOMA) Next TCS 01/2016  . DORSAL COMPARTMENT RELEASE Left 02/03/2013   Procedure: RELEASE DORSAL COMPARTMENT (DEQUERVAIN);  Surgeon: Carole Civil, MD;  Location: [facility];  Service: Orthopedics;  Laterality: Left;  . DORSAL COMPARTMENT RELEASE Right 09/11/2013   Procedure: RELEASE DORSAL COMPARTMENT (DEQUERVAIN);  Surgeon: Carole Civil, MD;  Location: [facility];  Service: Orthopedics;  Laterality: Right;  . ESOPHAGEAL DILATION  08/04/2012   Procedure: ESOPHAGEAL DILATION;  Surgeon: Daneil Dolin,  MD;  Location: [facility];  Service: Endoscopy;;  . ESOPHAGOGASTRODUODENOSCOPY  08/21/2003      Normal tubular esophagus status post passage of Maloney dilator as  described above/  2. Multiple linear antral erosions of uncertain significance, could be NSAID  . ESOPHAGOGASTRODUODENOSCOPY  08/04/2012   Procedure: ESOPHAGOGASTRODUODENOSCOPY (EGD);  Surgeon: Daneil Dolin, MD;  [facility];  Service: Endoscopy;  Laterality: N/A;  7:30  . ROTATOR CUFF REPAIR    . SHOULDER SURGERY     Rotator Cuff  . TRIGGER FINGER RELEASE Right 09/11/2013   Procedure: RIGHT LONG FINGER RELEASE TRIGGER FINGER;  Surgeon: Carole Civil, MD;  Location: [facility];  Service: Orthopedics;  Laterality: Right;   Social History   Socioeconomic History  . Marital status: Divorced    Spouse name: Not on file  . Number of children: 4  . Years of education: 63  . Highest education level: Not on file  Occupational History    Employer: RETIRED  Tobacco Use  . Smoking status: Former Smoker    Packs/day: 0.50    Years: 25.00    Pack years: 12.50    Types: Cigarettes    Quit date: 01/31/1963    Years since quitting: 57.9  . Smokeless tobacco: Never Used  Vaping Use  . Vaping Use: Never used  Substance and Sexual Activity  . Alcohol use: No    Alcohol/week: 0.0 standard drinks  . Drug use: No  . Sexual activity: Yes    Birth control/protection:  Surgical  Other Topics Concern  . Not on file  Social History Narrative   05/21/20 lives with daughter, son alternating   Social Determinants of Health   Financial Resource Strain: Not on file  Food Insecurity: Not on file  Transportation Needs: Not on file  Physical Activity: Not on file  Stress: Not on file  Social Connections: Not on file   Outpatient Encounter Medications as of 12/24/2020  Medication Sig  . aspirin 81 MG tablet Take 81 mg by mouth daily.  Marland Kitchen docusate sodium (COLACE) 100 MG capsule Take 100 mg by mouth 2 (two) times daily.  .  DULoxetine (CYMBALTA) 60 MG capsule Take 60 mg by mouth daily.  . ferrous sulfate 325 (65 FE) MG tablet Take 325 mg by mouth daily with breakfast.  . losartan-hydrochlorothiazide (HYZAAR) 100-25 MG tablet Take 1 tablet by mouth daily.  . [DISCONTINUED] levothyroxine (SYNTHROID) 112 MCG tablet Take 1 tablet (112 mcg total) by mouth daily before breakfast.  . levothyroxine (SYNTHROID) 88 MCG tablet Take 1 tablet (88 mcg total) by mouth daily before breakfast.   No facility-administered encounter medications on file as of 12/24/2020.   ALLERGIES: Allergies  Allergen Reactions  . Penicillins Rash and Other (See Comments)    Unknown 05/21/20 pt denies   VACCINATION STATUS: Immunization History  Administered Date(s) Administered  . Influenza Inj Mdck Quad Pf 09/14/2016  . Influenza Split 08/24/2015  . Influenza, High Dose Seasonal PF 08/23/2015, 08/12/2017, 08/22/2018  . Influenza,inj,Quad PF,6+ Mos 08/28/2019  . Influenza-Unspecified 08/25/2012, 09/27/2013, 08/21/2014  . Pneumococcal Conjugate-13 10/12/2019  . Pneumococcal Polysaccharide-23 08/23/2010, 08/12/2017  . Zoster Recombinat (Shingrix) 11/07/2019    Thyroid Problem Presents for follow-up visit. Patient reports no anxiety, cold intolerance, constipation, depressed mood, diarrhea, fatigue, hair loss, heat intolerance, leg swelling, palpitations, tremors, weight gain or weight loss. The symptoms have been stable.   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 She is on Levothyroxine 100 mcg p.o. daily before breakfast.   - She is accompanied by her grown daughter, her primary caregiver due to her dementia, who reports she is overall doing well but has trouble resting (she is always moving around doing things).  She has steady weight with only mild fluctuation.   -  She denies palpitations, heat intolerance, tremors.    Review of systems  Constitutional: + stable weight,  current Body mass index is 25.28 kg/m. , no  fatigue, no subjective hyperthermia, no subjective hypothermia Eyes: no blurry vision, no xerophthalmia ENT: no sore throat, no nodules palpated in throat, no dysphagia/odynophagia, no hoarseness Cardiovascular: no chest pain, no shortness of breath, no palpitations, no leg swelling Respiratory: no cough, no shortness of breath Gastrointestinal: no nausea/vomiting/diarrhea Musculoskeletal: no muscle/joint aches Skin: no rashes, no hyperemia Neurological: no tremors, no numbness, no tingling, no dizziness Psychiatric: no depression, no anxiety, + dementia with short term memory deficits  Objective:    BP (!) 150/81 (BP Location: Left Arm, Patient Position: Sitting)   Pulse 61   Ht 5' 4.5" (1.638 m)   Wt 149 lb 9.6 oz (67.9 kg)   BMI 25.28 kg/m   Wt Readings from Last 3 Encounters:  12/24/20 149 lb 9.6 oz (67.9 kg)  08/22/20 150 lb 6.4 oz (68.2 kg)  05/21/20 146 lb 3.2 oz (66.3 kg)    BP Readings from Last 3 Encounters:  12/24/20 (!) 150/81  08/22/20 (!) 144/73  05/21/20 (!) 149/82     Physical Exam- Limited  Constitutional:  Body mass index is 25.28 kg/m. , not in acute distress, normal state of mind Eyes:  EOMI, no exophthalmos Neck: Supple Cardiovascular: RRR, no murmers, rubs, or gallops, no edema Respiratory: Adequate breathing efforts, no crackles, rales, rhonchi, or wheezing Musculoskeletal: no gross deformities, strength intact in all four extremities, no gross restriction of joint movements Skin:  no rashes, no hyperemia Neurological: no tremor with outstretched hands   Recent Results (from the past 2160 hour(s))  TSH     Status: Abnormal   Collection Time: 12/16/20  9:35 AM  Result Value Ref Range   TSH 0.339 (L) 0.450 - 4.500 uIU/mL  T4, free     Status: None   Collection Time: 12/16/20  9:35 AM  Result Value Ref Range   Free T4 1.75 0.82 - 1.77 ng/dL      Assessment & Plan:   1. RAI induced Hypothyroidism - -Her previsit thyroid function tests are  consistent with slight over- replacement.  She is advised to lower her dose of Levothyroxine to 88 mcg po daily before breakfast.    - We discussed about the correct intake of her thyroid hormone, on empty stomach at fasting, with water, separated by at least 30 minutes from breakfast and other medications,  and separated by more than 4 hours from calcium, iron, multivitamins, acid reflux medications (PPIs). -Patient is made aware of the fact that thyroid hormone replacement is needed for life, dose to be adjusted by periodic monitoring of thyroid function tests.    - I advised patient to maintain close follow up with Redmond [employer], MD for primary care needs.       - Time spent on this patient care encounter:  20 minutes of which 50% was spent in  counseling and the rest reviewing  her current and  previous labs / studies and medications  doses and developing a plan for long term care. Lauren [name]  participated in the discussions, expressed understanding, and voiced agreement with the above plans.  All questions were answered to her satisfaction. she is encouraged to contact clinic should she have any questions or concerns prior to her return visit.   Follow up plan: Return in about 4 months (around 04/23/2021) for Thyroid follow up, Previsit labs.  Rayetta Pigg, [facility] [address] [place], [place] [address] Phone: [phone] Fax: [phone]  12/24/2020, 9:11 AM

## 2021-03-06 DIAGNOSIS — R600 Localized edema: Secondary | ICD-10-CM | POA: Diagnosis not present

## 2021-03-06 DIAGNOSIS — D649 Anemia, unspecified: Secondary | ICD-10-CM | POA: Diagnosis not present

## 2021-03-06 DIAGNOSIS — Z6825 Body mass index (BMI) 25.0-25.9, adult: Secondary | ICD-10-CM | POA: Diagnosis not present

## 2021-03-06 DIAGNOSIS — E663 Overweight: Secondary | ICD-10-CM | POA: Diagnosis not present

## 2021-03-06 DIAGNOSIS — Z13220 Encounter for screening for lipoid disorders: Secondary | ICD-10-CM | POA: Diagnosis not present

## 2021-03-06 DIAGNOSIS — I10 Essential (primary) hypertension: Secondary | ICD-10-CM | POA: Diagnosis not present

## 2021-03-06 DIAGNOSIS — E063 Autoimmune thyroiditis: Secondary | ICD-10-CM | POA: Diagnosis not present

## 2021-03-24 DIAGNOSIS — Z6825 Body mass index (BMI) 25.0-25.9, adult: Secondary | ICD-10-CM | POA: Diagnosis not present

## 2021-03-24 DIAGNOSIS — I509 Heart failure, unspecified: Secondary | ICD-10-CM | POA: Diagnosis not present

## 2021-03-24 DIAGNOSIS — I10 Essential (primary) hypertension: Secondary | ICD-10-CM | POA: Diagnosis not present

## 2021-03-24 DIAGNOSIS — R600 Localized edema: Secondary | ICD-10-CM | POA: Diagnosis not present

## 2021-03-24 DIAGNOSIS — E663 Overweight: Secondary | ICD-10-CM | POA: Diagnosis not present

## 2021-03-24 DIAGNOSIS — E785 Hyperlipidemia, unspecified: Secondary | ICD-10-CM | POA: Diagnosis not present

## 2021-03-24 DIAGNOSIS — N183 Chronic kidney disease, stage 3 unspecified: Secondary | ICD-10-CM | POA: Diagnosis not present

## 2021-04-14 DIAGNOSIS — E890 Postprocedural hypothyroidism: Secondary | ICD-10-CM | POA: Diagnosis not present

## 2021-04-15 LAB — TSH: TSH: 0.349 u[IU]/mL — ABNORMAL LOW (ref 0.450–4.500)

## 2021-04-15 LAB — T4, FREE: Free T4: 1.34 ng/dL (ref 0.82–1.77)

## 2021-04-17 DIAGNOSIS — N183 Chronic kidney disease, stage 3 unspecified: Secondary | ICD-10-CM | POA: Diagnosis not present

## 2021-04-17 DIAGNOSIS — I10 Essential (primary) hypertension: Secondary | ICD-10-CM | POA: Diagnosis not present

## 2021-04-17 DIAGNOSIS — Z1331 Encounter for screening for depression: Secondary | ICD-10-CM | POA: Diagnosis not present

## 2021-04-17 DIAGNOSIS — Z1389 Encounter for screening for other disorder: Secondary | ICD-10-CM | POA: Diagnosis not present

## 2021-04-17 DIAGNOSIS — Z6824 Body mass index (BMI) 24.0-24.9, adult: Secondary | ICD-10-CM | POA: Diagnosis not present

## 2021-04-22 NOTE — Patient Instructions (Signed)

## 2021-04-23 ENCOUNTER — Other Ambulatory Visit: Payer: Self-pay

## 2021-04-23 ENCOUNTER — Encounter: Payer: Self-pay | Admitting: Nurse Practitioner

## 2021-04-23 ENCOUNTER — Ambulatory Visit (INDEPENDENT_AMBULATORY_CARE_PROVIDER_SITE_OTHER): Payer: Medicare Other | Admitting: Nurse Practitioner

## 2021-04-23 VITALS — BP 143/76 | HR 51 | Ht 64.5 in | Wt 145.0 lb

## 2021-04-23 DIAGNOSIS — E890 Postprocedural hypothyroidism: Secondary | ICD-10-CM

## 2021-04-23 NOTE — Progress Notes (Signed)
04/23/2021       Endocrinology follow-up note   Subjective:    Patient ID: Lauren [name], female    DOB: 22-Nov-1934, PCP Redmond [employer], MD   Past Medical History:  Diagnosis Date  . Anxiety disorder   . Asthma   . Atherosclerotic heart disease of native coronary artery without angina pectoris   . Back pain   . Benign neoplasm of unspecified breast   . Chronic obstructive pulmonary disease, unspecified ([place])   . COPD (chronic obstructive pulmonary disease) (HCC)    O2 at night sometimes  . Essential hypertension, benign   . Gastro-esophageal reflux disease without esophagitis   . GERD (gastroesophageal reflux disease)   . History of cardiac catheterization    Reportedly normal coronaries in 2000  . Hyperlipidemia   . Hypertensive heart disease without heart failure   . Hypothyroidism, unspecified   . Insomnia, unspecified   . Major depressive disorder, single episode, unspecified   . Osteoarthritis   . Other fatigue   . Overactive bladder   . Polyosteoarthritis, unspecified   . Rectocele   . Unspecified asthma, uncomplicated   . Unspecified dementia without behavioral disturbance [facility])    Past Surgical History:  Procedure Laterality Date  . ABDOMINAL HYSTERECTOMY  1994  . ANKLE SURGERY     Rod and 13 screws  . APPENDECTOMY    . BREAST BIOPSY  1996   Benign  . COLONOSCOPY  01/27/2011   Normal rectum/Sigmoid diverticula/Multiple colonic polyps removed (TUBULAR ADENOMA) Next TCS 01/2016  . DORSAL COMPARTMENT RELEASE Left 02/03/2013   Procedure: RELEASE DORSAL COMPARTMENT (DEQUERVAIN);  Surgeon: Carole Civil, MD;  Location: [facility];  Service: Orthopedics;  Laterality: Left;  . DORSAL COMPARTMENT RELEASE Right 09/11/2013   Procedure: RELEASE DORSAL COMPARTMENT (DEQUERVAIN);  Surgeon: Carole Civil, MD;  Location: [facility];  Service: Orthopedics;  Laterality: Right;  . ESOPHAGEAL DILATION  08/04/2012   Procedure: ESOPHAGEAL DILATION;  Surgeon: Daneil Dolin,  MD;  [facility];  Service: Endoscopy;;  . ESOPHAGOGASTRODUODENOSCOPY  08/21/2003      Normal tubular esophagus status post passage of Maloney dilator as  described above/  2. Multiple linear antral erosions of uncertain significance, could be NSAID  . ESOPHAGOGASTRODUODENOSCOPY  08/04/2012   Procedure: ESOPHAGOGASTRODUODENOSCOPY (EGD);  Surgeon: Daneil Dolin, MD;  [facility];  Service: Endoscopy;  Laterality: N/A;  7:30  . ROTATOR CUFF REPAIR    . SHOULDER SURGERY     Rotator Cuff  . TRIGGER FINGER RELEASE Right 09/11/2013   Procedure: RIGHT LONG FINGER RELEASE TRIGGER FINGER;  Surgeon: Carole Civil, MD;  Location: [facility];  Service: Orthopedics;  Laterality: Right;   Social History   Socioeconomic History  . Marital status: Divorced    Spouse name: Not on file  . Number of children: 4  . Years of education: 73  . Highest education level: Not on file  Occupational History    Employer: RETIRED  Tobacco Use  . Smoking status: Former Smoker    Packs/day: 0.50    Years: 25.00    Pack years: 12.50    Types: Cigarettes    Quit date: 01/31/1963    Years since quitting: 58.2  . Smokeless tobacco: Never Used  Vaping Use  . Vaping Use: Never used  Substance and Sexual Activity  . Alcohol use: No    Alcohol/week: 0.0 standard drinks  . Drug use: No  . Sexual activity: Yes    Birth control/protection:  Surgical  Other Topics Concern  . Not on file  Social History Narrative   05/21/20 lives with daughter, son alternating   Social Determinants of Health   Financial Resource Strain: Not on file  Food Insecurity: Not on file  Transportation Needs: Not on file  Physical Activity: Not on file  Stress: Not on file  Social Connections: Not on file   Outpatient Encounter Medications as of 04/23/2021  Medication Sig  . aspirin 81 MG tablet Take 81 mg by mouth daily.  Marland Kitchen docusate sodium (COLACE) 100 MG capsule Take 100 mg by mouth 2 (two) times daily.  .  DULoxetine (CYMBALTA) 60 MG capsule Take 60 mg by mouth daily.  . ferrous sulfate 325 (65 FE) MG tablet Take 325 mg by mouth daily with breakfast.  . levothyroxine (SYNTHROID) 88 MCG tablet Take 1 tablet (88 mcg total) by mouth daily before breakfast.  . torsemide (DEMADEX) 20 MG tablet Take 20 mg by mouth daily.  . [DISCONTINUED] losartan-hydrochlorothiazide (HYZAAR) 100-25 MG tablet Take 1 tablet by mouth daily.   No facility-administered encounter medications on file as of 04/23/2021.   ALLERGIES: Allergies  Allergen Reactions  . Penicillins Rash and Other (See Comments)    Unknown 05/21/20 pt denies   VACCINATION STATUS: Immunization History  Administered Date(s) Administered  . Influenza Inj Mdck Quad Pf 09/14/2016  . Influenza Split 08/24/2015  . Influenza, High Dose Seasonal PF 08/23/2015, 08/12/2017, 08/22/2018  . Influenza,inj,Quad PF,6+ Mos 08/28/2019  . Influenza-Unspecified 08/25/2012, 09/27/2013, 08/21/2014  . Pneumococcal Conjugate-13 10/12/2019  . Pneumococcal Polysaccharide-23 08/23/2010, 08/12/2017  . Zoster Recombinat (Shingrix) 11/07/2019    Thyroid Problem Presents for follow-up visit. Patient reports no anxiety, cold intolerance, constipation, depressed mood, diarrhea, fatigue, hair loss, heat intolerance, leg swelling, palpitations, tremors, weight gain or weight loss. The symptoms have been stable.   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 She is on Levothyroxine 88 mcg p.o. daily before breakfast.    - She is accompanied by her grown daughter, her primary caregiver due to her dementia, who reports since we reduced her dose she is better able to rest and is not as fidgety.  She has steady weight with only mild fluctuation.    -  She denies palpitations, heat intolerance, tremors.     Review of systems  Constitutional: + Minimally fluctuating body weight,  current Body mass index is 24.5 kg/m. , no fatigue, no subjective hyperthermia, no  subjective hypothermia Eyes: no blurry vision, no xerophthalmia ENT: no sore throat, no nodules palpated in throat, no dysphagia/odynophagia, no hoarseness Cardiovascular: no chest pain, no shortness of breath, no palpitations, no leg swelling Respiratory: no cough, no shortness of breath Gastrointestinal: no nausea/vomiting/diarrhea Musculoskeletal: no muscle/joint aches Skin: no rashes, no hyperemia Neurological: no tremors, no numbness, no tingling, no dizziness Psychiatric: no depression, no anxiety, + dementia with short-term memory deficits  Objective:    BP (!) 143/76   Pulse (!) 51   Ht 5' 4.5" (1.638 m)   Wt 145 lb (65.8 kg)   BMI 24.50 kg/m   Wt Readings from Last 3 Encounters:  04/23/21 145 lb (65.8 kg)  12/24/20 149 lb 9.6 oz (67.9 kg)  08/22/20 150 lb 6.4 oz (68.2 kg)    BP Readings from Last 3 Encounters:  04/23/21 (!) 143/76  12/24/20 (!) 150/81  08/22/20 (!) 144/73    Physical Exam- Limited  Constitutional:  Body mass index is 24.5 kg/m. , not in acute  distress, normal state of mind Eyes:  EOMI, no exophthalmos Neck: Supple Cardiovascular: RRR, no murmurs, rubs, or gallops, no edema Respiratory: Adequate breathing efforts, no crackles, rales, rhonchi, or wheezing Musculoskeletal: no gross deformities, strength intact in all four extremities, no gross restriction of joint movements Skin:  no rashes, no hyperemia Neurological: no tremor with outstretched hands   Recent Results (from the past 2160 hour(s))  TSH     Status: Abnormal   Collection Time: 04/14/21 11:40 AM  Result Value Ref Range   TSH 0.349 (L) 0.450 - 4.500 uIU/mL  T4, free     Status: None   Collection Time: 04/14/21 11:40 AM  Result Value Ref Range   Free T4 1.34 0.82 - 1.77 ng/dL      Assessment & Plan:   1. RAI induced Hypothyroidism -  -Her previsit thyroid function tests are consistent with appropriate hormone replacement.  She is advised to continue Levothyroxine 88 mcg po  daily before breakfast.   - We discussed about the correct intake of her thyroid hormone, on empty stomach at fasting, with water, separated by at least 30 minutes from breakfast and other medications,  and separated by more than 4 hours from calcium, iron, multivitamins, acid reflux medications (PPIs). -Patient is made aware of the fact that thyroid hormone replacement is needed for life, dose to be adjusted by periodic monitoring of thyroid function tests.    - I advised patient to maintain close follow up with Redmond School, MD for primary care needs.      I spent 20 minutes in the care of the patient today including review of labs from Thyroid Function, CMP, and other relevant labs ; imaging/biopsy records (current and previous including abstractions from other facilities); face-to-face time discussing  her lab results and symptoms, medications doses, her options of short and long term treatment based on the latest standards of care / guidelines;   and documenting the encounter.  Lauren [name]  participated in the discussions, expressed understanding, and voiced agreement with the above plans.  All questions were answered to her satisfaction. she is encouraged to contact clinic should she have any questions or concerns prior to her return visit.    Follow up plan: Return in about 6 months (around 10/23/2021) for Thyroid follow up, Previsit labs.    Rayetta Pigg, [facility] [address] [place], [place] [address] Phone: [phone] Fax: [phone]  04/23/2021, 8:28 AM

## 2021-06-12 DIAGNOSIS — Z79899 Other long term (current) drug therapy: Secondary | ICD-10-CM | POA: Diagnosis not present

## 2021-06-12 DIAGNOSIS — N1832 Chronic kidney disease, stage 3b: Secondary | ICD-10-CM | POA: Diagnosis not present

## 2021-06-12 DIAGNOSIS — I129 Hypertensive chronic kidney disease with stage 1 through stage 4 chronic kidney disease, or unspecified chronic kidney disease: Secondary | ICD-10-CM | POA: Diagnosis not present

## 2021-06-12 DIAGNOSIS — D638 Anemia in other chronic diseases classified elsewhere: Secondary | ICD-10-CM | POA: Diagnosis not present

## 2021-06-12 DIAGNOSIS — I5032 Chronic diastolic (congestive) heart failure: Secondary | ICD-10-CM | POA: Diagnosis not present

## 2021-06-13 ENCOUNTER — Other Ambulatory Visit (HOSPITAL_COMMUNITY): Payer: Self-pay | Admitting: Nephrology

## 2021-06-13 DIAGNOSIS — N1832 Chronic kidney disease, stage 3b: Secondary | ICD-10-CM

## 2021-06-13 DIAGNOSIS — D638 Anemia in other chronic diseases classified elsewhere: Secondary | ICD-10-CM

## 2021-06-20 DIAGNOSIS — I5032 Chronic diastolic (congestive) heart failure: Secondary | ICD-10-CM | POA: Diagnosis not present

## 2021-06-20 DIAGNOSIS — D638 Anemia in other chronic diseases classified elsewhere: Secondary | ICD-10-CM | POA: Diagnosis not present

## 2021-06-20 DIAGNOSIS — N1832 Chronic kidney disease, stage 3b: Secondary | ICD-10-CM | POA: Diagnosis not present

## 2021-06-20 DIAGNOSIS — I129 Hypertensive chronic kidney disease with stage 1 through stage 4 chronic kidney disease, or unspecified chronic kidney disease: Secondary | ICD-10-CM | POA: Diagnosis not present

## 2021-06-20 DIAGNOSIS — Z79899 Other long term (current) drug therapy: Secondary | ICD-10-CM | POA: Diagnosis not present

## 2021-06-23 ENCOUNTER — Ambulatory Visit (HOSPITAL_COMMUNITY)
Admission: RE | Admit: 2021-06-23 | Discharge: 2021-06-23 | Disposition: A | Discharge status: Home / Self Care | Payer: Medicare Other | Source: Ambulatory Visit | Attending: Nephrology | Admitting: Nephrology

## 2021-06-23 ENCOUNTER — Other Ambulatory Visit: Payer: Self-pay

## 2021-06-23 DIAGNOSIS — N1832 Chronic kidney disease, stage 3b: Secondary | ICD-10-CM | POA: Diagnosis not present

## 2021-06-23 DIAGNOSIS — D638 Anemia in other chronic diseases classified elsewhere: Secondary | ICD-10-CM | POA: Insufficient documentation

## 2021-07-16 ENCOUNTER — Other Ambulatory Visit (HOSPITAL_COMMUNITY): Payer: Self-pay | Admitting: Nephrology

## 2021-07-16 DIAGNOSIS — R93421 Abnormal radiologic findings on diagnostic imaging of right kidney: Secondary | ICD-10-CM

## 2021-07-16 DIAGNOSIS — N170 Acute kidney failure with tubular necrosis: Secondary | ICD-10-CM | POA: Diagnosis not present

## 2021-07-16 DIAGNOSIS — E559 Vitamin D deficiency, unspecified: Secondary | ICD-10-CM | POA: Diagnosis not present

## 2021-07-16 DIAGNOSIS — E211 Secondary hyperparathyroidism, not elsewhere classified: Secondary | ICD-10-CM | POA: Diagnosis not present

## 2021-07-16 DIAGNOSIS — E790 Hyperuricemia without signs of inflammatory arthritis and tophaceous disease: Secondary | ICD-10-CM | POA: Diagnosis not present

## 2021-07-16 DIAGNOSIS — K760 Fatty (change of) liver, not elsewhere classified: Secondary | ICD-10-CM | POA: Diagnosis not present

## 2021-07-16 DIAGNOSIS — N1832 Chronic kidney disease, stage 3b: Secondary | ICD-10-CM | POA: Diagnosis not present

## 2021-07-16 DIAGNOSIS — R894 Abnormal immunological findings in specimens from other organs, systems and tissues: Secondary | ICD-10-CM | POA: Diagnosis not present

## 2021-07-17 DIAGNOSIS — R894 Abnormal immunological findings in specimens from other organs, systems and tissues: Secondary | ICD-10-CM | POA: Diagnosis not present

## 2021-07-17 DIAGNOSIS — N170 Acute kidney failure with tubular necrosis: Secondary | ICD-10-CM | POA: Diagnosis not present

## 2021-07-17 DIAGNOSIS — E790 Hyperuricemia without signs of inflammatory arthritis and tophaceous disease: Secondary | ICD-10-CM | POA: Diagnosis not present

## 2021-07-17 DIAGNOSIS — R93421 Abnormal radiologic findings on diagnostic imaging of right kidney: Secondary | ICD-10-CM | POA: Diagnosis not present

## 2021-07-17 DIAGNOSIS — E559 Vitamin D deficiency, unspecified: Secondary | ICD-10-CM | POA: Diagnosis not present

## 2021-07-17 DIAGNOSIS — N1832 Chronic kidney disease, stage 3b: Secondary | ICD-10-CM | POA: Diagnosis not present

## 2021-07-17 DIAGNOSIS — E211 Secondary hyperparathyroidism, not elsewhere classified: Secondary | ICD-10-CM | POA: Diagnosis not present

## 2021-07-17 DIAGNOSIS — K760 Fatty (change of) liver, not elsewhere classified: Secondary | ICD-10-CM | POA: Diagnosis not present

## 2021-07-18 DIAGNOSIS — E790 Hyperuricemia without signs of inflammatory arthritis and tophaceous disease: Secondary | ICD-10-CM | POA: Diagnosis not present

## 2021-07-18 DIAGNOSIS — K760 Fatty (change of) liver, not elsewhere classified: Secondary | ICD-10-CM | POA: Diagnosis not present

## 2021-07-18 DIAGNOSIS — E559 Vitamin D deficiency, unspecified: Secondary | ICD-10-CM | POA: Diagnosis not present

## 2021-07-18 DIAGNOSIS — R93421 Abnormal radiologic findings on diagnostic imaging of right kidney: Secondary | ICD-10-CM | POA: Diagnosis not present

## 2021-07-18 DIAGNOSIS — R894 Abnormal immunological findings in specimens from other organs, systems and tissues: Secondary | ICD-10-CM | POA: Diagnosis not present

## 2021-07-18 DIAGNOSIS — E211 Secondary hyperparathyroidism, not elsewhere classified: Secondary | ICD-10-CM | POA: Diagnosis not present

## 2021-07-18 DIAGNOSIS — N1832 Chronic kidney disease, stage 3b: Secondary | ICD-10-CM | POA: Diagnosis not present

## 2021-07-18 DIAGNOSIS — N170 Acute kidney failure with tubular necrosis: Secondary | ICD-10-CM | POA: Diagnosis not present

## 2021-07-21 ENCOUNTER — Encounter (HOSPITAL_COMMUNITY): Payer: Medicare Other

## 2021-07-25 DIAGNOSIS — N170 Acute kidney failure with tubular necrosis: Secondary | ICD-10-CM | POA: Diagnosis not present

## 2021-07-25 DIAGNOSIS — E559 Vitamin D deficiency, unspecified: Secondary | ICD-10-CM | POA: Diagnosis not present

## 2021-07-25 DIAGNOSIS — N1832 Chronic kidney disease, stage 3b: Secondary | ICD-10-CM | POA: Diagnosis not present

## 2021-07-25 DIAGNOSIS — E211 Secondary hyperparathyroidism, not elsewhere classified: Secondary | ICD-10-CM | POA: Diagnosis not present

## 2021-07-25 DIAGNOSIS — D638 Anemia in other chronic diseases classified elsewhere: Secondary | ICD-10-CM | POA: Diagnosis not present

## 2021-07-30 ENCOUNTER — Encounter (HOSPITAL_COMMUNITY): Payer: Self-pay

## 2021-07-30 ENCOUNTER — Other Ambulatory Visit: Payer: Self-pay

## 2021-07-30 ENCOUNTER — Encounter (HOSPITAL_COMMUNITY)
Admission: RE | Admit: 2021-07-30 | Discharge: 2021-07-30 | Disposition: A | Discharge status: Home / Self Care | Payer: Medicare Other | Source: Ambulatory Visit | Attending: Nephrology | Admitting: Nephrology

## 2021-07-30 DIAGNOSIS — R93421 Abnormal radiologic findings on diagnostic imaging of right kidney: Secondary | ICD-10-CM | POA: Insufficient documentation

## 2021-07-30 DIAGNOSIS — N170 Acute kidney failure with tubular necrosis: Secondary | ICD-10-CM | POA: Insufficient documentation

## 2021-07-30 DIAGNOSIS — N179 Acute kidney failure, unspecified: Secondary | ICD-10-CM | POA: Diagnosis not present

## 2021-07-30 DIAGNOSIS — N2889 Other specified disorders of kidney and ureter: Secondary | ICD-10-CM | POA: Diagnosis not present

## 2021-07-30 MED ORDER — FUROSEMIDE 10 MG/ML IJ SOLN: 32.9000 mg | Freq: Once | INTRAMUSCULAR | Status: AC

## 2021-07-30 MED ORDER — TECHNETIUM TC 99M MERTIATIDE
5.0000 | Freq: Once | INTRAVENOUS | Status: AC | PRN
  Administered 2021-07-30: 5.5 via INTRAVENOUS

## 2021-07-30 MED ORDER — FUROSEMIDE 10 MG/ML IJ SOLN
INTRAMUSCULAR | Status: AC
  Administered 2021-07-30: 32.9 mg via INTRAVENOUS
  Filled 2021-07-30: qty 4

## 2021-08-15 ENCOUNTER — Other Ambulatory Visit (HOSPITAL_COMMUNITY)
Admission: RE | Admit: 2021-08-15 | Discharge: 2021-08-15 | Disposition: A | Discharge status: Home / Self Care | Payer: Medicare Other | Source: Ambulatory Visit | Attending: Nephrology | Admitting: Nephrology

## 2021-08-15 DIAGNOSIS — D638 Anemia in other chronic diseases classified elsewhere: Secondary | ICD-10-CM | POA: Diagnosis not present

## 2021-08-15 DIAGNOSIS — N1832 Chronic kidney disease, stage 3b: Secondary | ICD-10-CM | POA: Diagnosis not present

## 2021-08-15 DIAGNOSIS — E559 Vitamin D deficiency, unspecified: Secondary | ICD-10-CM | POA: Insufficient documentation

## 2021-08-15 DIAGNOSIS — I5032 Chronic diastolic (congestive) heart failure: Secondary | ICD-10-CM | POA: Diagnosis not present

## 2021-08-15 DIAGNOSIS — I129 Hypertensive chronic kidney disease with stage 1 through stage 4 chronic kidney disease, or unspecified chronic kidney disease: Secondary | ICD-10-CM | POA: Diagnosis not present

## 2021-08-15 DIAGNOSIS — N170 Acute kidney failure with tubular necrosis: Secondary | ICD-10-CM | POA: Insufficient documentation

## 2021-08-15 DIAGNOSIS — D508 Other iron deficiency anemias: Secondary | ICD-10-CM | POA: Diagnosis not present

## 2021-08-15 DIAGNOSIS — R93421 Abnormal radiologic findings on diagnostic imaging of right kidney: Secondary | ICD-10-CM | POA: Diagnosis not present

## 2021-08-15 DIAGNOSIS — E211 Secondary hyperparathyroidism, not elsewhere classified: Secondary | ICD-10-CM | POA: Insufficient documentation

## 2021-08-15 DIAGNOSIS — I5033 Acute on chronic diastolic (congestive) heart failure: Secondary | ICD-10-CM | POA: Diagnosis not present

## 2021-08-15 LAB — CBC WITH DIFFERENTIAL/PLATELET
Abs Immature Granulocytes: 0.02 10*3/uL (ref 0.00–0.07)
Basophils Absolute: 0 10*3/uL (ref 0.0–0.1)
Basophils Relative: 1 %
Eosinophils Absolute: 0.3 10*3/uL (ref 0.0–0.5)
Eosinophils Relative: 4 %
HCT: 31.7 % — ABNORMAL LOW (ref 36.0–46.0)
Hemoglobin: 9.8 g/dL — ABNORMAL LOW (ref 12.0–15.0)
Immature Granulocytes: 0 %
Lymphocytes Relative: 17 %
Lymphs Abs: 1.1 10*3/uL (ref 0.7–4.0)
MCH: 30.7 pg (ref 26.0–34.0)
MCHC: 30.9 g/dL (ref 30.0–36.0)
MCV: 99.4 fL (ref 80.0–100.0)
Monocytes Absolute: 0.5 10*3/uL (ref 0.1–1.0)
Monocytes Relative: 9 %
Neutro Abs: 4.4 10*3/uL (ref 1.7–7.7)
Neutrophils Relative %: 69 %
Platelets: 226 10*3/uL (ref 150–400)
RBC: 3.19 MIL/uL — ABNORMAL LOW (ref 3.87–5.11)
RDW: 13.6 % (ref 11.5–15.5)
WBC: 6.3 10*3/uL (ref 4.0–10.5)
nRBC: 0 % (ref 0.0–0.2)

## 2021-08-15 LAB — RENAL FUNCTION PANEL
Albumin: 3.6 g/dL (ref 3.5–5.0)
Anion gap: 4 — ABNORMAL LOW (ref 5–15)
BUN: 27 mg/dL — ABNORMAL HIGH (ref 8–23)
CO2: 30 mmol/L (ref 22–32)
Calcium: 8.4 mg/dL — ABNORMAL LOW (ref 8.9–10.3)
Chloride: 103 mmol/L (ref 98–111)
Creatinine, Ser: 1.53 mg/dL — ABNORMAL HIGH (ref 0.44–1.00)
GFR, Estimated: 33 mL/min — ABNORMAL LOW (ref >60)
Glucose, Bld: 83 mg/dL (ref 70–99)
Phosphorus: 3.9 mg/dL (ref 2.5–4.6)
Potassium: 4.2 mmol/L (ref 3.5–5.1)
Sodium: 137 mmol/L (ref 135–145)

## 2021-08-15 LAB — IRON AND TIBC
Iron: 29 ug/dL (ref 28–170)
Saturation Ratios: 10 % — ABNORMAL LOW (ref 10.4–31.8)
TIBC: 277 ug/dL (ref 250–450)
UIBC: 248 ug/dL

## 2021-08-15 LAB — FERRITIN: Ferritin: 179 ng/mL (ref 11–307)

## 2021-08-27 ENCOUNTER — Encounter: Payer: Self-pay | Admitting: Internal Medicine

## 2021-08-28 ENCOUNTER — Encounter (HOSPITAL_COMMUNITY)
Admission: RE | Admit: 2021-08-28 | Discharge: 2021-08-28 | Disposition: A | Discharge status: Home / Self Care | Payer: Medicare Other | Source: Ambulatory Visit | Attending: Nephrology | Admitting: Nephrology

## 2021-08-28 DIAGNOSIS — D509 Iron deficiency anemia, unspecified: Secondary | ICD-10-CM | POA: Insufficient documentation

## 2021-08-28 MED ORDER — SODIUM CHLORIDE 0.9 % IV SOLN: Freq: Once | INTRAVENOUS | Status: AC

## 2021-08-28 MED ORDER — FERUMOXYTOL INJECTION 510 MG/17 ML
510.0000 mg | INTRAVENOUS | Status: DC
  Administered 2021-08-28: 510 mg via INTRAVENOUS
  Filled 2021-08-28: qty 17

## 2021-09-04 ENCOUNTER — Encounter (HOSPITAL_COMMUNITY)
Admission: RE | Admit: 2021-09-04 | Discharge: 2021-09-04 | Disposition: A | Discharge status: Home / Self Care | Payer: Medicare Other | Source: Ambulatory Visit | Attending: Nephrology | Admitting: Nephrology

## 2021-09-04 ENCOUNTER — Encounter (HOSPITAL_COMMUNITY): Payer: Self-pay

## 2021-09-04 DIAGNOSIS — D509 Iron deficiency anemia, unspecified: Secondary | ICD-10-CM | POA: Diagnosis not present

## 2021-09-04 MED ORDER — SODIUM CHLORIDE 0.9 % IV SOLN
510.0000 mg | Freq: Once | INTRAVENOUS | Status: AC
  Administered 2021-09-04: 510 mg via INTRAVENOUS
  Filled 2021-09-04: qty 17

## 2021-09-04 MED ORDER — SODIUM CHLORIDE 0.9 % IV SOLN
INTRAVENOUS | Status: DC
  Administered 2021-09-04: 250 mL via INTRAVENOUS

## 2021-09-11 ENCOUNTER — Ambulatory Visit (INDEPENDENT_AMBULATORY_CARE_PROVIDER_SITE_OTHER): Payer: Medicare Other | Admitting: Urology

## 2021-09-11 ENCOUNTER — Other Ambulatory Visit: Payer: Self-pay

## 2021-09-11 ENCOUNTER — Encounter: Payer: Self-pay | Admitting: Urology

## 2021-09-11 VITALS — BP 130/73 | HR 72 | Temp 97.0°F | Wt 145.0 lb

## 2021-09-11 DIAGNOSIS — N1330 Unspecified hydronephrosis: Secondary | ICD-10-CM | POA: Diagnosis not present

## 2021-09-11 DIAGNOSIS — N3281 Overactive bladder: Secondary | ICD-10-CM

## 2021-09-11 DIAGNOSIS — N1832 Chronic kidney disease, stage 3b: Secondary | ICD-10-CM | POA: Diagnosis not present

## 2021-09-11 LAB — URINALYSIS, ROUTINE W REFLEX MICROSCOPIC
Bilirubin, UA: NEGATIVE
Glucose, UA: NEGATIVE
Ketones, UA: NEGATIVE
Leukocytes,UA: NEGATIVE
Nitrite, UA: NEGATIVE
RBC, UA: NEGATIVE
Specific Gravity, UA: 1.015 (ref 1.005–1.030)
Urobilinogen, Ur: 1 mg/dL (ref 0.2–1.0)
pH, UA: 7 (ref 5.0–7.5)

## 2021-09-11 NOTE — Progress Notes (Signed)
Urological Symptom Review  Patient is experiencing the following symptoms: Get up at night to urinate Leakage of urine Stream starts and stops Trouble starting stream   Review of Systems  Gastrointestinal (upper)  : Negative for upper GI symptoms  Gastrointestinal (lower) : Negative for lower GI symptoms  Constitutional : Fatigue  Skin: Negative for skin symptoms  Eyes: Negative for eye symptoms  Ear/Nose/Throat : Negative for Ear/Nose/Throat symptoms  Hematologic/Lymphatic: Negative for Hematologic/Lymphatic symptoms  Cardiovascular : Leg swelling  Respiratory : Negative for respiratory symptoms  Endocrine: Negative for endocrine symptoms  Musculoskeletal: Back pain  Neurological: Negative for neurological symptoms  Psychologic: Negative for psychiatric symptoms

## 2021-09-11 NOTE — Progress Notes (Signed)
post void residual = >46 ML

## 2021-09-11 NOTE — Progress Notes (Signed)
Mild   Assessment: 1. Hydronephrosis of right kidney   2. Stage 3b chronic kidney disease ([place])     Plan: I reviewed the imaging studies including the ultrasound and Lasix renogram.  There is evidence of some fullness of the right renal pelvis but no significant obstruction noted on the renogram.  Since she has not had a complete anatomic evaluation, I think it would be reasonable to proceed with CT imaging. Schedule for further evaluation of possible causes of right hydronephrosis with a CT urogram.  We will avoid contrast due to her chronic kidney disease. I will contact the patient and her daughter with results when available.  Chief Complaint:  Chief Complaint  Patient presents with   Hydronephrosis     History of Present Illness:  Lauren [name] is a 87 y.o. year old female who is seen in consultation from [place]. Theador Hawthorne, MD for evaluation of right hydronephrosis.  She was evaluated for chronic kidney disease with an ultrasound on 06/23/2021.  The study showed fullness of the right renal pelvis versus possible extrarenal pelvis without mass or significant hydronephrosis visualized.  A Lasix renogram was ordered for follow-up.  This study showed normal uptake of the tracer by both kidneys with excretion of contrast bilaterally.  On the right the tracer filled a mildly distended renal pelvis and partial clearance of tracer is seen prior to Lasix.  Following diuretic administration good clearance of tracer from the right kidney is seen with mild trace residual in the mildly prominent renal pelvis at the end of exam.  The examination was terminated 10 minutes early due to the patient's need to void.  T1 half on the right was 18.7 minutes and on the left 13.6 minutes.  Differential function was 42% on the left and 58% on the right.  She is not having any flank pain.  No history of gross hematuria or dysuria.  No UTIs.  She does report some low back pain.  She also has urinary incontinence.   Patient does have dementia and majority of the history is provided by her daughter.   Past Medical History:  Past Medical History:  Diagnosis Date   Anxiety disorder    Asthma    Atherosclerotic heart disease of native coronary artery without angina pectoris    Back pain    Benign neoplasm of unspecified breast    Chronic obstructive pulmonary disease, unspecified (HCC)    COPD (chronic obstructive pulmonary disease) (HCC)    O2 at night sometimes   Essential hypertension, benign    Gastro-esophageal reflux disease without esophagitis    GERD (gastroesophageal reflux disease)    History of cardiac catheterization    Reportedly normal coronaries in 2000   Hyperlipidemia    Hypertensive heart disease without heart failure    Hypothyroidism, unspecified    Insomnia, unspecified    Major depressive disorder, single episode, unspecified    Osteoarthritis    Other fatigue    Overactive bladder    Polyosteoarthritis, unspecified    Rectocele    Unspecified asthma, uncomplicated    Unspecified dementia without behavioral disturbance     Past Surgical History:  Past Surgical History:  Procedure Laterality Date   ABDOMINAL HYSTERECTOMY  1994   ANKLE SURGERY     Rod and 13 screws   APPENDECTOMY     BREAST BIOPSY  1996   Benign   COLONOSCOPY  01/27/2011   Normal rectum/Sigmoid diverticula/Multiple colonic polyps removed (TUBULAR ADENOMA) Next TCS 01/2016  DORSAL COMPARTMENT RELEASE Left 02/03/2013   Procedure: RELEASE DORSAL COMPARTMENT (DEQUERVAIN);  Surgeon: Carole Civil, MD;  Location: [facility];  Service: Orthopedics;  Laterality: Left;   DORSAL COMPARTMENT RELEASE Right 09/11/2013   Procedure: RELEASE DORSAL COMPARTMENT (DEQUERVAIN);  Surgeon: Carole Civil, MD;  Location: [facility];  Service: Orthopedics;  Laterality: Right;   ESOPHAGEAL DILATION  08/04/2012   Procedure: ESOPHAGEAL DILATION;  Surgeon: Daneil Dolin, MD;  [facility];  Service: Endoscopy;;    ESOPHAGOGASTRODUODENOSCOPY  08/21/2003      Normal tubular esophagus status post passage of Maloney dilator as  described above/  2. Multiple linear antral erosions of uncertain significance, could be NSAID   ESOPHAGOGASTRODUODENOSCOPY  08/04/2012   Procedure: ESOPHAGOGASTRODUODENOSCOPY (EGD);  Surgeon: Daneil Dolin, MD;  [facility];  Service: Endoscopy;  Laterality: N/A;  7:30   ROTATOR CUFF REPAIR     SHOULDER SURGERY     Rotator Cuff   TRIGGER FINGER RELEASE Right 09/11/2013   Procedure: RIGHT LONG FINGER RELEASE TRIGGER FINGER;  Surgeon: Carole Civil, MD;  Location: [facility];  Service: Orthopedics;  Laterality: Right;    Allergies:  Allergies  Allergen Reactions   Penicillins Rash and Other (See Comments)    Unknown 05/21/20 pt denies    Family History:  Family History  Problem Relation Age of Onset   Tuberculosis Mother 51       Deceased   Kidney failure Father 33   Asthma Other    Arthritis Other    Prostate cancer Son    Colon cancer Neg Hx     Social History:  Social History   Tobacco Use   Smoking status: Former    Packs/day: 0.50    Years: 25.00    Pack years: 12.50    Types: Cigarettes    Quit date: 01/31/1963    Years since quitting: 58.6   Smokeless tobacco: Never  Vaping Use   Vaping Use: Never used  Substance Use Topics   Alcohol use: No    Alcohol/week: 0.0 standard drinks   Drug use: No    Review of symptoms:  Constitutional:  Negative for unexplained weight loss, night sweats, fever, chills ENT:  Negative for nose bleeds, sinus pain, painful swallowing CV:  Negative for chest pain, shortness of breath, exercise intolerance, palpitations, loss of consciousness Resp:  Negative for cough, wheezing, shortness of breath GI:  Negative for nausea, vomiting, diarrhea, bloody stools GU:  Positives noted in HPI; otherwise negative for gross hematuria, dysuria Neuro:  Negative for seizures, poor balance, limb weakness, slurred  speech Psych:  Negative for lack of energy, depression, anxiety Endocrine:  Negative for polydipsia, polyuria, symptoms of hypoglycemia (dizziness, hunger, sweating) Hematologic:  Negative for anemia, purpura, petechia, prolonged or excessive bleeding, use of anticoagulants  Allergic:  Negative for difficulty breathing or choking as a result of exposure to anything; no shellfish allergy; no allergic response (rash/itch) to materials, foods  Physical exam: BP 130/73   Pulse 72   Temp (!) 97 F (36.1 C)   Wt 145 lb (65.8 kg)   BMI 24.50 kg/m  GENERAL APPEARANCE:  Well appearing, well developed, well nourished, NAD HEENT: Atraumatic, Normocephalic, oropharynx clear. NECK: Supple without lymphadenopathy or thyromegaly. LUNGS: Clear to auscultation bilaterally. HEART: Regular Rate and Rhythm without murmurs, gallops, or rubs. ABDOMEN: Soft, non-tender, No Masses. EXTREMITIES: Moves all extremities well.  Without clubbing, cyanosis, or edema. NEUROLOGIC:  Alert and oriented x 3, CN II-XII grossly  intact.  MENTAL STATUS:  Pleasantly demented BACK:  Non-tender to palpation.  No CVAT SKIN:  Warm, dry and intact.    Results: Results for orders placed or performed in visit on 09/11/21 (from the past 24 hour(s))  Urinalysis, Routine w reflex microscopic     Status: Abnormal   Collection Time: 09/11/21 11:35 AM  Result Value Ref Range   Specific Gravity, UA 1.015 1.005 - 1.030   pH, UA 7.0 5.0 - 7.5   Color, UA Amber (A) Yellow   Appearance Ur Clear Clear   Leukocytes,UA Negative Negative   Protein,UA Trace (A) Negative/Trace   Glucose, UA Negative Negative   Ketones, UA Negative Negative   RBC, UA Negative Negative   Bilirubin, UA Negative Negative   Urobilinogen, Ur 1.0 0.2 - 1.0 mg/dL   Nitrite, UA Negative Negative   Microscopic Examination Comment    Narrative   Performed at:  [facility] [address] [place], [place]  [id number] Lab Director: Mina Marble  [place], Phone:  [phone]    PVR:  >46 ml

## 2021-10-10 ENCOUNTER — Other Ambulatory Visit: Payer: Self-pay

## 2021-10-10 ENCOUNTER — Ambulatory Visit (HOSPITAL_COMMUNITY)
Admission: RE | Admit: 2021-10-10 | Discharge: 2021-10-10 | Disposition: A | Discharge status: Home / Self Care | Payer: Medicare Other | Source: Ambulatory Visit | Attending: Urology | Admitting: Urology

## 2021-10-10 DIAGNOSIS — N1832 Chronic kidney disease, stage 3b: Secondary | ICD-10-CM | POA: Diagnosis not present

## 2021-10-10 DIAGNOSIS — M47816 Spondylosis without myelopathy or radiculopathy, lumbar region: Secondary | ICD-10-CM | POA: Diagnosis not present

## 2021-10-10 DIAGNOSIS — N1330 Unspecified hydronephrosis: Secondary | ICD-10-CM | POA: Diagnosis not present

## 2021-10-10 DIAGNOSIS — K429 Umbilical hernia without obstruction or gangrene: Secondary | ICD-10-CM | POA: Diagnosis not present

## 2021-10-10 DIAGNOSIS — N189 Chronic kidney disease, unspecified: Secondary | ICD-10-CM | POA: Diagnosis not present

## 2021-10-14 ENCOUNTER — Telehealth: Payer: Self-pay

## 2021-10-14 NOTE — Telephone Encounter (Signed)
-----   Message from Primus Bravo, MD sent at 10/14/2021 12:07 PM EST ----- Please notify the patient's daughter that the CT scan does not show any significant obstruction to her kidneys.  Given these findings and the recent renal function test, I do not think that further evaluation is indicated unless she were to develop symptoms or significant worsening of her renal function.  She may follow-up as needed.

## 2021-10-14 NOTE — Telephone Encounter (Signed)
Daughter called and made aware. 

## 2021-10-17 DIAGNOSIS — E890 Postprocedural hypothyroidism: Secondary | ICD-10-CM | POA: Diagnosis not present

## 2021-10-18 LAB — TSH: TSH: 1.65 u[IU]/mL (ref 0.450–4.500)

## 2021-10-18 LAB — T4, FREE: Free T4: 1.31 ng/dL (ref 0.82–1.77)

## 2021-10-20 DIAGNOSIS — R93421 Abnormal radiologic findings on diagnostic imaging of right kidney: Secondary | ICD-10-CM | POA: Diagnosis not present

## 2021-10-20 DIAGNOSIS — I129 Hypertensive chronic kidney disease with stage 1 through stage 4 chronic kidney disease, or unspecified chronic kidney disease: Secondary | ICD-10-CM | POA: Diagnosis not present

## 2021-10-20 DIAGNOSIS — N1832 Chronic kidney disease, stage 3b: Secondary | ICD-10-CM | POA: Diagnosis not present

## 2021-10-20 DIAGNOSIS — D638 Anemia in other chronic diseases classified elsewhere: Secondary | ICD-10-CM | POA: Diagnosis not present

## 2021-10-20 DIAGNOSIS — D508 Other iron deficiency anemias: Secondary | ICD-10-CM | POA: Diagnosis not present

## 2021-10-20 DIAGNOSIS — I5032 Chronic diastolic (congestive) heart failure: Secondary | ICD-10-CM | POA: Diagnosis not present

## 2021-10-22 NOTE — Patient Instructions (Signed)

## 2021-10-23 ENCOUNTER — Other Ambulatory Visit: Payer: Self-pay

## 2021-10-23 ENCOUNTER — Encounter: Payer: Self-pay | Admitting: Nurse Practitioner

## 2021-10-23 ENCOUNTER — Ambulatory Visit (INDEPENDENT_AMBULATORY_CARE_PROVIDER_SITE_OTHER): Payer: Medicare Other | Admitting: Nurse Practitioner

## 2021-10-23 VITALS — BP 133/82 | HR 79 | Ht 64.5 in | Wt 136.0 lb

## 2021-10-23 DIAGNOSIS — E890 Postprocedural hypothyroidism: Secondary | ICD-10-CM

## 2021-10-23 MED ORDER — LEVOTHYROXINE SODIUM 88 MCG PO TABS: 88.0000 ug | ORAL_TABLET | Freq: Every day | ORAL | 3 refills | Status: DC

## 2021-10-23 NOTE — Progress Notes (Signed)
10/23/2021       Endocrinology follow-up note   Subjective:    Patient ID: Lauren [name], female    DOB: [birth year], PCP Redmond School, MD   Past Medical History:  Diagnosis Date   Anxiety disorder    Asthma    Atherosclerotic heart disease of native coronary artery without angina pectoris    Back pain    Benign neoplasm of unspecified breast    Chronic obstructive pulmonary disease, unspecified (HCC)    COPD (chronic obstructive pulmonary disease) ([place])    O2 at night sometimes   Essential hypertension, benign    Gastro-esophageal reflux disease without esophagitis    GERD (gastroesophageal reflux disease)    History of cardiac catheterization    Reportedly normal coronaries in 2000   Hyperlipidemia    Hypertensive heart disease without heart failure    Hypothyroidism, unspecified    Insomnia, unspecified    Major depressive disorder, single episode, unspecified    Osteoarthritis    Other fatigue    Overactive bladder    Polyosteoarthritis, unspecified    Rectocele    Unspecified asthma, uncomplicated    Unspecified dementia without behavioral disturbance    Past Surgical History:  Procedure Laterality Date   ABDOMINAL HYSTERECTOMY  1994   ANKLE SURGERY     Rod and 13 screws   APPENDECTOMY     BREAST BIOPSY  1996   Benign   COLONOSCOPY  01/27/2011   Normal rectum/Sigmoid diverticula/Multiple colonic polyps removed (TUBULAR ADENOMA) Next TCS 01/2016   DORSAL COMPARTMENT RELEASE Left 02/03/2013   Procedure: RELEASE DORSAL COMPARTMENT (DEQUERVAIN);  Surgeon: Carole Civil, MD;  Location: [facility];  Service: Orthopedics;  Laterality: Left;   DORSAL COMPARTMENT RELEASE Right 09/11/2013   Procedure: RELEASE DORSAL COMPARTMENT (DEQUERVAIN);  Surgeon: Carole Civil, MD;  Location: [facility];  Service: Orthopedics;  Laterality: Right;   ESOPHAGEAL DILATION  08/04/2012   Procedure: ESOPHAGEAL DILATION;  Surgeon: Daneil Dolin, MD;  [facility];   Service: Endoscopy;;   ESOPHAGOGASTRODUODENOSCOPY  08/21/2003      Normal tubular esophagus status post passage of Maloney dilator as  described above/  2. Multiple linear antral erosions of uncertain significance, could be NSAID   ESOPHAGOGASTRODUODENOSCOPY  08/04/2012   Procedure: ESOPHAGOGASTRODUODENOSCOPY (EGD);  Surgeon: Daneil Dolin, MD;  [facility];  Service: Endoscopy;  Laterality: N/A;  7:30   ROTATOR CUFF REPAIR     SHOULDER SURGERY     Rotator Cuff   TRIGGER FINGER RELEASE Right 09/11/2013   Procedure: RIGHT LONG FINGER RELEASE TRIGGER FINGER;  Surgeon: Carole Civil, MD;  Location: [facility];  Service: Orthopedics;  Laterality: Right;   Social History   Socioeconomic History   Marital status: Divorced    Spouse name: Not on file   Number of children: 4   Years of education: 12   Highest education level: Not on file  Occupational History    Employer: RETIRED  Tobacco Use   Smoking status: Former    Packs/day: 0.50    Years: 25.00    Pack years: 12.50    Types: Cigarettes    Quit date: 01/31/1963    Years since quitting: 58.7   Smokeless tobacco: Never  Vaping Use   Vaping Use: Never used  Substance and Sexual Activity   Alcohol use: No    Alcohol/week: 0.0 standard drinks   Drug use: No   Sexual activity: Yes    Birth control/protection: Surgical  Other  Topics Concern   Not on file  Social History Narrative   05/21/20 lives with daughter, son alternating   Social Determinants of Health   Financial Resource Strain: Not on file  Food Insecurity: Not on file  Transportation Needs: Not on file  Physical Activity: Not on file  Stress: Not on file  Social Connections: Not on file   Outpatient Encounter Medications as of 10/23/2021  Medication Sig   aspirin 81 MG tablet Take 81 mg by mouth daily.   docusate sodium (COLACE) 100 MG capsule Take 100 mg by mouth 2 (two) times daily.   DULoxetine (CYMBALTA) 60 MG capsule Take 60 mg by mouth daily.    ferrous sulfate 325 (65 FE) MG tablet Take 325 mg by mouth daily with breakfast.   levothyroxine (SYNTHROID) 88 MCG tablet Take 1 tablet (88 mcg total) by mouth daily before breakfast.   torsemide (DEMADEX) 20 MG tablet Take 20 mg by mouth daily.   traZODone (DESYREL) 50 MG tablet Take 50 mg by mouth at bedtime.   [DISCONTINUED] levothyroxine (SYNTHROID) 88 MCG tablet Take 1 tablet (88 mcg total) by mouth daily before breakfast.   No facility-administered encounter medications on file as of 10/23/2021.   ALLERGIES: Allergies  Allergen Reactions   Penicillins Rash and Other (See Comments)    Unknown 05/21/20 pt denies   VACCINATION STATUS: Immunization History  Administered Date(s) Administered   Influenza Inj Mdck Quad Pf 09/14/2016   Influenza Split 08/24/2015   Influenza, High Dose Seasonal PF 08/23/2015, 08/12/2017, 08/22/2018   Influenza,inj,Quad PF,6+ Mos 08/28/2019   Influenza-Unspecified 08/25/2012, 09/27/2013, 08/21/2014   Pneumococcal Conjugate-13 10/12/2019   Pneumococcal Polysaccharide-23 08/23/2010, 08/12/2017   Zoster Recombinat (Shingrix) 11/07/2019    Thyroid Problem Presents for follow-up visit. Patient reports no anxiety, cold intolerance, constipation, depressed mood, diarrhea, fatigue, hair loss, heat intolerance, leg swelling, palpitations, tremors, weight gain or weight loss. The symptoms have been stable.   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 She is on Levothyroxine 88 mcg p.o. daily before breakfast.    - She is accompanied by her grown daughter, her primary caregiver due to her dementia, who reports since we reduced her dose she is better able to rest and is not as fidgety.  She has steady weight with only mild fluctuation.    - She denies palpitations, heat intolerance, tremors.     Review of systems  Constitutional: + steadily decreasing body weight,  current Body mass index is 22.98 kg/m. , no fatigue, no subjective  hyperthermia, no subjective hypothermia Eyes: no blurry vision, no xerophthalmia ENT: no sore throat, no nodules palpated in throat, no dysphagia/odynophagia, no hoarseness Cardiovascular: no chest pain, no shortness of breath, no palpitations, no leg swelling Respiratory: no cough, no shortness of breath Gastrointestinal: no nausea/vomiting/diarrhea Musculoskeletal: no muscle/joint aches Skin: no rashes, no hyperemia Neurological: no tremors, no numbness, no tingling, no dizziness Psychiatric: no depression, no anxiety, + dementia with short-term memory deficits  Objective:    BP 133/82   Pulse 79   Ht 5' 4.5" (1.638 m)   Wt 136 lb (61.7 kg)   BMI 22.98 kg/m   Wt Readings from Last 3 Encounters:  10/23/21 136 lb (61.7 kg)  09/11/21 145 lb (65.8 kg)  09/04/21 145 lb 1 oz (65.8 kg)    BP Readings from Last 3 Encounters:  10/23/21 133/82  09/11/21 130/73  09/04/21 (!) 141/67    Physical Exam- Limited  Constitutional:  Body  mass index is 22.98 kg/m. , not in acute distress, forgetful- advanced dementia Eyes:  EOMI, no exophthalmos Neck: Supple Cardiovascular: RRR, no murmurs, rubs, or gallops, no edema Respiratory: Adequate breathing efforts, no crackles, rales, rhonchi, or wheezing Musculoskeletal: no gross deformities, strength intact in all four extremities, no gross restriction of joint movements Skin:  no rashes, no hyperemia Neurological: no tremor with outstretched hands   Recent Results (from the past 2160 hour(s))  Ferritin     Status: None   Collection Time: 08/15/21 12:32 PM  Result Value Ref Range   Ferritin 179 11 - 307 ng/mL    Comment: Performed at [facility], [address] [place], [place] [address]  Iron and TIBC     Status: Abnormal   Collection Time: 08/15/21 12:32 PM  Result Value Ref Range   Iron 29 28 - 170 ug/dL   TIBC 277 250 - 450 ug/dL   Saturation Ratios 10 (L) 10.4 - 31.8 %   UIBC 248 ug/dL    Comment: Performed at [facility], [address] [place], [place] [address]  CBC with Differential/Platelet     Status: Abnormal   Collection Time: 08/15/21 12:33 PM  Result Value Ref Range   WBC 6.3 4.0 - 10.5 K/uL   RBC 3.19 (L) 3.87 - 5.11 MIL/uL   Hemoglobin 9.8 (L) 12.0 - 15.0 g/dL   HCT 31.7 (L) 36.0 - 46.0 %   MCV 99.4 80.0 - 100.0 fL   MCH 30.7 26.0 - 34.0 pg   MCHC 30.9 30.0 - 36.0 g/dL   RDW 13.6 11.5 - 15.5 %   Platelets 226 150 - 400 K/uL   nRBC 0.0 0.0 - 0.2 %   Neutrophils Relative % 69 %   Neutro Abs 4.4 1.7 - 7.7 K/uL   Lymphocytes Relative 17 %   Lymphs Abs 1.1 0.7 - 4.0 K/uL   Monocytes Relative 9 %   Monocytes Absolute 0.5 0.1 - 1.0 K/uL   Eosinophils Relative 4 %   Eosinophils Absolute 0.3 0.0 - 0.5 K/uL   Basophils Relative 1 %   Basophils Absolute 0.0 0.0 - 0.1 K/uL   Immature Granulocytes 0 %   Abs Immature Granulocytes 0.02 0.00 - 0.07 K/uL    Comment: Performed at [facility], [address], [place], [place] [address]  Renal function panel     Status: Abnormal   Collection Time: 08/15/21 12:33 PM  Result Value Ref Range   Sodium 137 135 - 145 mmol/L   Potassium 4.2 3.5 - 5.1 mmol/L   Chloride 103 98 - 111 mmol/L   CO2 30 22 - 32 mmol/L   Glucose, Bld 83 70 - 99 mg/dL    Comment: Glucose reference range applies only to samples taken after fasting for at least 8 hours.   BUN 27 (H) 8 - 23 mg/dL   Creatinine, Ser 1.53 (H) 0.44 - 1.00 mg/dL   Calcium 8.4 (L) 8.9 - 10.3 mg/dL   Phosphorus 3.9 2.5 - 4.6 mg/dL   Albumin 3.6 3.5 - 5.0 g/dL   GFR, Estimated 33 (L) >60 mL/min    Comment: (NOTE) Calculated using the CKD-EPI Creatinine Equation (2021)    Anion gap 4 (L) 5 - 15    Comment: Performed at [facility], [address], [place], [place] [address]  Urinalysis, Routine w reflex microscopic     Status: Abnormal   Collection Time: 09/11/21 11:35 AM  Result Value Ref Range   Specific Gravity, UA  1.015 1.005 - 1.030   pH, UA 7.0 5.0 - 7.5   Color, UA Amber (A) Yellow    Appearance Ur Clear Clear   Leukocytes,UA Negative Negative   Protein,UA Trace (A) Negative/Trace   Glucose, UA Negative Negative   Ketones, UA Negative Negative   RBC, UA Negative Negative   Bilirubin, UA Negative Negative   Urobilinogen, Ur 1.0 0.2 - 1.0 mg/dL   Nitrite, UA Negative Negative   Microscopic Examination Comment     Comment: Microscopic follows if indicated.  TSH     Status: None   Collection Time: 10/17/21  9:42 AM  Result Value Ref Range   TSH 1.650 0.450 - 4.500 uIU/mL  T4, free     Status: None   Collection Time: 10/17/21  9:42 AM  Result Value Ref Range   Free T4 1.31 0.82 - 1.77 ng/dL      Assessment & Plan:   1. RAI induced Hypothyroidism -  -Her previsit thyroid function tests are consistent with appropriate hormone replacement.  She is advised to continue Levothyroxine 88 mcg po daily before breakfast.    - We discussed about the correct intake of her thyroid hormone, on empty stomach at fasting, with water, separated by at least 30 minutes from breakfast and other medications,  and separated by more than 4 hours from calcium, iron, multivitamins, acid reflux medications (PPIs). -Patient is made aware of the fact that thyroid hormone replacement is needed for life, dose to be adjusted by periodic monitoring of thyroid function tests.    - I advised patient to maintain close follow up with Redmond School, MD for primary care needs.     I spent 20 minutes in the care of the patient today including review of labs from Thyroid Function, CMP, and other relevant labs ; imaging/biopsy records (current and previous including abstractions from other facilities); face-to-face time discussing  her lab results and symptoms, medications doses, her options of short and long term treatment based on the latest standards of care / guidelines;   and documenting the encounter.  Lauren [name]  participated in the discussions, expressed understanding, and voiced  agreement with the above plans.  All questions were answered to her satisfaction. she is encouraged to contact clinic should she have any questions or concerns prior to her return visit.    Follow up plan: Return in about 6 months (around 04/23/2022) for Thyroid follow up, Previsit labs.   Rayetta Pigg, [facility] [address] [place], [place] [address] Phone: [phone] Fax: [phone]  10/23/2021, 8:33 AM

## 2021-10-24 DIAGNOSIS — I5032 Chronic diastolic (congestive) heart failure: Secondary | ICD-10-CM | POA: Diagnosis not present

## 2021-10-24 DIAGNOSIS — E211 Secondary hyperparathyroidism, not elsewhere classified: Secondary | ICD-10-CM | POA: Diagnosis not present

## 2021-10-24 DIAGNOSIS — N1832 Chronic kidney disease, stage 3b: Secondary | ICD-10-CM | POA: Diagnosis not present

## 2021-10-24 DIAGNOSIS — D508 Other iron deficiency anemias: Secondary | ICD-10-CM | POA: Diagnosis not present

## 2021-10-24 DIAGNOSIS — D638 Anemia in other chronic diseases classified elsewhere: Secondary | ICD-10-CM | POA: Diagnosis not present

## 2021-10-24 DIAGNOSIS — E559 Vitamin D deficiency, unspecified: Secondary | ICD-10-CM | POA: Diagnosis not present

## 2021-10-24 DIAGNOSIS — I129 Hypertensive chronic kidney disease with stage 1 through stage 4 chronic kidney disease, or unspecified chronic kidney disease: Secondary | ICD-10-CM | POA: Diagnosis not present

## 2021-11-06 DIAGNOSIS — E663 Overweight: Secondary | ICD-10-CM | POA: Diagnosis not present

## 2021-11-06 DIAGNOSIS — Z0000 Encounter for general adult medical examination without abnormal findings: Secondary | ICD-10-CM | POA: Diagnosis not present

## 2021-11-06 DIAGNOSIS — I130 Hypertensive heart and chronic kidney disease with heart failure and stage 1 through stage 4 chronic kidney disease, or unspecified chronic kidney disease: Secondary | ICD-10-CM | POA: Diagnosis not present

## 2021-11-06 DIAGNOSIS — I10 Essential (primary) hypertension: Secondary | ICD-10-CM | POA: Diagnosis not present

## 2021-11-06 DIAGNOSIS — E063 Autoimmune thyroiditis: Secondary | ICD-10-CM | POA: Diagnosis not present

## 2021-11-06 DIAGNOSIS — Z1331 Encounter for screening for depression: Secondary | ICD-10-CM | POA: Diagnosis not present

## 2021-11-06 DIAGNOSIS — F03A0 Unspecified dementia, mild, without behavioral disturbance, psychotic disturbance, mood disturbance, and anxiety: Secondary | ICD-10-CM | POA: Diagnosis not present

## 2021-11-06 DIAGNOSIS — Z6822 Body mass index (BMI) 22.0-22.9, adult: Secondary | ICD-10-CM | POA: Diagnosis not present

## 2021-12-11 DIAGNOSIS — E663 Overweight: Secondary | ICD-10-CM | POA: Diagnosis not present

## 2021-12-11 DIAGNOSIS — Z6822 Body mass index (BMI) 22.0-22.9, adult: Secondary | ICD-10-CM | POA: Diagnosis not present

## 2021-12-11 DIAGNOSIS — F03A0 Unspecified dementia, mild, without behavioral disturbance, psychotic disturbance, mood disturbance, and anxiety: Secondary | ICD-10-CM | POA: Diagnosis not present

## 2021-12-11 DIAGNOSIS — I10 Essential (primary) hypertension: Secondary | ICD-10-CM | POA: Diagnosis not present

## 2021-12-11 DIAGNOSIS — I130 Hypertensive heart and chronic kidney disease with heart failure and stage 1 through stage 4 chronic kidney disease, or unspecified chronic kidney disease: Secondary | ICD-10-CM | POA: Diagnosis not present

## 2021-12-11 DIAGNOSIS — E063 Autoimmune thyroiditis: Secondary | ICD-10-CM | POA: Diagnosis not present

## 2021-12-29 DIAGNOSIS — E559 Vitamin D deficiency, unspecified: Secondary | ICD-10-CM | POA: Diagnosis not present

## 2021-12-29 DIAGNOSIS — N1832 Chronic kidney disease, stage 3b: Secondary | ICD-10-CM | POA: Diagnosis not present

## 2021-12-29 DIAGNOSIS — D508 Other iron deficiency anemias: Secondary | ICD-10-CM | POA: Diagnosis not present

## 2021-12-29 DIAGNOSIS — I5032 Chronic diastolic (congestive) heart failure: Secondary | ICD-10-CM | POA: Diagnosis not present

## 2021-12-29 DIAGNOSIS — D638 Anemia in other chronic diseases classified elsewhere: Secondary | ICD-10-CM | POA: Diagnosis not present

## 2021-12-29 DIAGNOSIS — I129 Hypertensive chronic kidney disease with stage 1 through stage 4 chronic kidney disease, or unspecified chronic kidney disease: Secondary | ICD-10-CM | POA: Diagnosis not present

## 2021-12-29 DIAGNOSIS — E211 Secondary hyperparathyroidism, not elsewhere classified: Secondary | ICD-10-CM | POA: Diagnosis not present

## 2022-01-01 DIAGNOSIS — E559 Vitamin D deficiency, unspecified: Secondary | ICD-10-CM | POA: Diagnosis not present

## 2022-01-01 DIAGNOSIS — E211 Secondary hyperparathyroidism, not elsewhere classified: Secondary | ICD-10-CM | POA: Diagnosis not present

## 2022-01-01 DIAGNOSIS — N1832 Chronic kidney disease, stage 3b: Secondary | ICD-10-CM | POA: Diagnosis not present

## 2022-01-01 DIAGNOSIS — I129 Hypertensive chronic kidney disease with stage 1 through stage 4 chronic kidney disease, or unspecified chronic kidney disease: Secondary | ICD-10-CM | POA: Diagnosis not present

## 2022-01-01 DIAGNOSIS — I5032 Chronic diastolic (congestive) heart failure: Secondary | ICD-10-CM | POA: Diagnosis not present

## 2022-01-01 DIAGNOSIS — E875 Hyperkalemia: Secondary | ICD-10-CM | POA: Diagnosis not present

## 2022-01-01 DIAGNOSIS — D638 Anemia in other chronic diseases classified elsewhere: Secondary | ICD-10-CM | POA: Diagnosis not present

## 2022-01-15 DIAGNOSIS — N1832 Chronic kidney disease, stage 3b: Secondary | ICD-10-CM | POA: Diagnosis not present

## 2022-01-15 DIAGNOSIS — E211 Secondary hyperparathyroidism, not elsewhere classified: Secondary | ICD-10-CM | POA: Diagnosis not present

## 2022-01-15 DIAGNOSIS — I129 Hypertensive chronic kidney disease with stage 1 through stage 4 chronic kidney disease, or unspecified chronic kidney disease: Secondary | ICD-10-CM | POA: Diagnosis not present

## 2022-01-15 DIAGNOSIS — D638 Anemia in other chronic diseases classified elsewhere: Secondary | ICD-10-CM | POA: Diagnosis not present

## 2022-01-15 DIAGNOSIS — E875 Hyperkalemia: Secondary | ICD-10-CM | POA: Diagnosis not present

## 2022-01-15 DIAGNOSIS — I5032 Chronic diastolic (congestive) heart failure: Secondary | ICD-10-CM | POA: Diagnosis not present

## 2022-01-15 DIAGNOSIS — E559 Vitamin D deficiency, unspecified: Secondary | ICD-10-CM | POA: Diagnosis not present

## 2022-01-16 ENCOUNTER — Ambulatory Visit: Payer: Medicare Other | Admitting: Gastroenterology

## 2022-01-22 ENCOUNTER — Encounter: Payer: Self-pay | Admitting: *Deleted

## 2022-01-26 ENCOUNTER — Institutional Professional Consult (permissible substitution): Payer: Medicare Other | Admitting: Diagnostic Neuroimaging

## 2022-01-26 ENCOUNTER — Ambulatory Visit (INDEPENDENT_AMBULATORY_CARE_PROVIDER_SITE_OTHER): Payer: Medicare Other | Admitting: Diagnostic Neuroimaging

## 2022-01-26 ENCOUNTER — Encounter: Payer: Self-pay | Admitting: Diagnostic Neuroimaging

## 2022-01-26 VITALS — BP 165/81 | HR 58 | Ht 64.5 in | Wt 146.6 lb

## 2022-01-26 DIAGNOSIS — F03B18 Unspecified dementia, moderate, with other behavioral disturbance: Secondary | ICD-10-CM

## 2022-01-26 MED ORDER — MIRTAZAPINE 7.5 MG PO TABS: 7.5000 mg | ORAL_TABLET | Freq: Every day | ORAL | 6 refills | Status: DC

## 2022-01-26 NOTE — Patient Instructions (Signed)
?  MODERATE-SEVERE DEMENTIA (with night-time agitation, insomnia) ?- trial of mitrazipine 7.'5mg'$  at bedtime; may increase per PCP ?- continue quetiapine '50mg'$  at bedtime; may increase per PCP ?- consider palliative care consult ?- safety / supervision issues reviewed ?- daily physical activity / exercise (at least 15-30 minutes) ?- eat more plants / vegetables ?- increase social activities, brain stimulation, games, puzzles, hobbies, crafts, arts, music ?- aim for at least 7-8 hours sleep per night (or more) ?- avoid smoking and alcohol ?- caregiver resources provided ?- caution with medications, finances; no driving ?

## 2022-01-26 NOTE — Progress Notes (Signed)
[facility]  PATIENT: Lauren [name] DOB: 1934/02/12  REFERRING CLINICIAN: Jake Samples, PA* HISTORY FROM: patient and daughter  REASON FOR VISIT: FOLLOW UP   HISTORICAL  CHIEF COMPLAINT:  Chief Complaint  Patient presents with   Dementia    Rm 7 Est pt last seen 2021, Dgtr [name]  MMSE 9    HISTORY OF PRESENT ILLNESS:   UPDATE (01/26/22, VRP): Since last visit, progression of symptoms. Needs more help at home. Daughter is main caregiver. More nighttime wandering, staying awake. They tried seroquel and trazodone without relief.   PRIOR HPI (05/21/20): 88 year old female here for evaluation of dementia.  Patient has had gradual onset progressive short-term memory loss confusion since 2017.  She was on Namenda for a couple of years but then discontinued due to lack of effectiveness.  Patient lives alone.  She has great support from her family, neighbors and friends.  Patient now repeating her self.  She is getting more confused.  Having difficulty taking medications.  No longer driving.  She has become more withdrawn, decreased socializing, easily frustrated.   REVIEW OF SYSTEMS: Full 14 system review of systems performed and negative with exception of: As per HPI.  ALLERGIES: Allergies  Allergen Reactions   Penicillins Rash and Other (See Comments)    Unknown 05/21/20 pt denies    HOME MEDICATIONS: Outpatient Medications Prior to Visit  Medication Sig Dispense Refill   aspirin 81 MG tablet Take 81 mg by mouth daily.     calcitRIOL (ROCALTROL) 0.25 MCG capsule Take by mouth.     docusate sodium (COLACE) 100 MG capsule Take 100 mg by mouth 2 (two) times daily.     DULoxetine (CYMBALTA) 60 MG capsule Take 60 mg by mouth daily.     ferrous sulfate 325 (65 FE) MG tablet Take 325 mg by mouth daily with breakfast.     levothyroxine (SYNTHROID) 88 MCG tablet Take 1 tablet (88 mcg total) by mouth daily before breakfast. 90 tablet 3   QUEtiapine (SEROQUEL)  50 MG tablet Take 50 mg by mouth at bedtime.     torsemide (DEMADEX) 20 MG tablet Take 20 mg by mouth daily.     traZODone (DESYREL) 50 MG tablet Take 50 mg by mouth at bedtime.     calcitRIOL (ROCALTROL) 0.25 MCG capsule Take by mouth.     No facility-administered medications prior to visit.    PAST MEDICAL HISTORY: Past Medical History:  Diagnosis Date   Anxiety disorder    Asthma    Atherosclerotic heart disease of native coronary artery without angina pectoris    Back pain    Benign neoplasm of unspecified breast    Chronic obstructive pulmonary disease, unspecified (HCC)    COPD (chronic obstructive pulmonary disease) (HCC)    O2 at night sometimes   Dementia (HCC)    Essential hypertension, benign    Gastro-esophageal reflux disease without esophagitis    GERD (gastroesophageal reflux disease)    History of cardiac catheterization    Reportedly normal coronaries in 2000   Hyperlipidemia    Hypertensive heart disease without heart failure    Hypothyroidism, unspecified    Insomnia, unspecified    Major depressive disorder, single episode, unspecified    Osteoarthritis    Other fatigue    Overactive bladder    Polyosteoarthritis, unspecified    Rectocele    Unspecified asthma, uncomplicated    Unspecified dementia without behavioral disturbance     PAST SURGICAL HISTORY: Past Surgical  History:  Procedure Laterality Date   ABDOMINAL HYSTERECTOMY  1994   ANKLE SURGERY     Rod and 13 screws   APPENDECTOMY     BREAST BIOPSY  1996   Benign   COLONOSCOPY  01/27/2011   Normal rectum/Sigmoid diverticula/Multiple colonic polyps removed (TUBULAR ADENOMA) Next TCS 01/2016   DORSAL COMPARTMENT RELEASE Left 02/03/2013   Procedure: RELEASE DORSAL COMPARTMENT (DEQUERVAIN);  Surgeon: Carole Civil, MD;  Location: [facility];  Service: Orthopedics;  Laterality: Left;   DORSAL COMPARTMENT RELEASE Right 09/11/2013   Procedure: RELEASE DORSAL COMPARTMENT (DEQUERVAIN);  Surgeon:  Carole Civil, MD;  Location: [facility];  Service: Orthopedics;  Laterality: Right;   ESOPHAGEAL DILATION  08/04/2012   Procedure: ESOPHAGEAL DILATION;  Surgeon: Daneil Dolin, MD;  [facility]: [facility];  Service: Endoscopy;;   ESOPHAGOGASTRODUODENOSCOPY  08/21/2003      Normal tubular esophagus status post passage of Maloney dilator as  described above/  2. Multiple linear antral erosions of uncertain significance, could be NSAID   ESOPHAGOGASTRODUODENOSCOPY  08/04/2012   Procedure: ESOPHAGOGASTRODUODENOSCOPY (EGD);  Surgeon: Daneil Dolin, MD;  [facility];  Service: Endoscopy;  Laterality: N/A;  7:30   ROTATOR CUFF REPAIR     SHOULDER SURGERY     Rotator Cuff   TRIGGER FINGER RELEASE Right 09/11/2013   Procedure: RIGHT LONG FINGER RELEASE TRIGGER FINGER;  Surgeon: Carole Civil, MD;  Location: [facility];  Service: Orthopedics;  Laterality: Right;    FAMILY HISTORY: Family History  Problem Relation Age of Onset   Tuberculosis Mother 66       Deceased   Kidney failure Father 31   Asthma Other    Arthritis Other    Prostate cancer Son    Colon cancer Neg Hx     SOCIAL HISTORY: Social History   Socioeconomic History   Marital status: Divorced    Spouse name: Not on file   Number of children: 4   Years of education: 12   Highest education level: Not on file  Occupational History    Employer: RETIRED  Tobacco Use   Smoking status: Former    Packs/day: 0.50    Years: 25.00    Pack years: 12.50    Types: Cigarettes    Quit date: 01/31/1963    Years since quitting: 59.0   Smokeless tobacco: Never  Vaping Use   Vaping Use: Never used  Substance and Sexual Activity   Alcohol use: No    Alcohol/week: 0.0 standard drinks   Drug use: No   Sexual activity: Yes    Birth control/protection: Surgical  Other Topics Concern   Not on file  Social History Narrative   01/26/22 lives with daughter, [name]   Social Determinants of Health   Financial Resource  Strain: Not on file  Food Insecurity: Not on file  Transportation Needs: Not on file  Physical Activity: Not on file  Stress: Not on file  Social Connections: Not on file  Intimate Partner Violence: Not on file     PHYSICAL EXAM  GENERAL EXAM/CONSTITUTIONAL: Vitals:  Vitals:   01/26/22 1244  BP: (!) 165/81  Pulse: (!) 58  Weight: 146 lb 9.6 oz (66.5 kg)  Height: 5' 4.5" (1.638 m)   Body mass index is 24.78 kg/m. Wt Readings from Last 3 Encounters:  01/26/22 146 lb 9.6 oz (66.5 kg)  10/23/21 136 lb (61.7 kg)  09/11/21 145 lb (65.8 kg)   Patient is CALM and in no  distress; well developed, nourished and groomed; neck is supple  CARDIOVASCULAR: Examination of carotid arteries is normal; no carotid bruits Regular rate and rhythm, no murmurs Examination of peripheral vascular system by observation and palpation is normal   MUSCULOSKELETAL: Gait, strength, tone, movements noted in Neurologic exam below  NEUROLOGIC: MENTAL STATUS:  MMSE - Mini Mental State Exam 01/26/2022 05/21/2020  Orientation to time 0 0  Orientation to Place 2 2  Registration 1 3  Attention/ Calculation 0 1  Recall 0 0  Language- name 2 objects 1 2  Language- repeat 0 0  Language- follow 3 step command 3 2  Language- follow 3 step command-comments - didn't fold paper  Language- read & follow direction 1 1  Write a sentence 1 1  Copy design 0 0  Total score 9 12   awake, alert, oriented to person Vandiver attention and concentration DECR FLUENCY  CRANIAL NERVE:  2nd, 3rd, 4th, 6th - pupils equal and reactive to light, visual fields full to confrontation, extraocular muscles intact, no nystagmus 5th - facial sensation symmetric 7th - facial strength symmetric 8th - hearing intact 9th - palate elevates symmetrically, uvula midline 11th - shoulder shrug symmetric 12th - tongue protrusion midline  MOTOR:  normal bulk and tone, full strength in the BUE, BLE  SENSORY:  normal and  symmetric to light touch  COORDINATION:  finger-nose-finger, fine finger movements normal  REFLEXES:  deep tendon reflexes present and symmetric  GAIT/STATION:  narrow based gait     DIAGNOSTIC DATA (LABS, IMAGING, TESTING) - I reviewed patient records, labs, notes, testing and imaging myself where available.  Lab Results  Component Value Date   WBC 6.3 08/15/2021   HGB 9.8 (L) 08/15/2021   HCT 31.7 (L) 08/15/2021   MCV 99.4 08/15/2021   PLT 226 08/15/2021      Component Value Date/Time   NA 137 08/15/2021 1233   NA 140 07/19/2019 0000   K 4.2 08/15/2021 1233   CL 103 08/15/2021 1233   CO2 30 08/15/2021 1233   GLUCOSE 83 08/15/2021 1233   BUN 27 (H) 08/15/2021 1233   BUN 22 (A) 07/19/2019 0000   CREATININE 1.53 (H) 08/15/2021 1233   CALCIUM 8.4 (L) 08/15/2021 1233   ALBUMIN 3.6 08/15/2021 1233   AST 23 07/19/2019 0000   ALT 16 07/19/2019 0000   ALKPHOS 64 07/19/2019 0000   GFRNONAA 33 (L) 08/15/2021 1233   GFRAA 46 07/19/2019 0000   Lab Results  Component Value Date   CHOL 232 (A) 07/19/2019   HDL 80 (A) 07/19/2019   LDLCALC 137 07/19/2019   TRIG 56 07/19/2019   No results found for: HGBA1C Lab Results  Component Value Date   OACZYSAY30 160 05/21/2020   Lab Results  Component Value Date   TSH 1.650 10/17/2021    06/25/20 MRI brain - No acute intracranial process. Sequela of remote small left centrum semiovale and right basal ganglia lacunar infarcts. - Mild global cerebral atrophy. Moderate chronic microvascular ischemic changes.   ASSESSMENT AND PLAN  88 y.o. year old female here with:  Dx:  1. Moderate dementia with behavioral disturbance      PLAN:  MODERATE-SEVERE DEMENTIA (with night-time agitation, insomnia) - trial of mitrazipine 7.'5mg'$  at bedtime; may increase per PCP - continue quetiapine '50mg'$  at bedtime; may increase per PCP - consider palliative care consult - safety / supervision issues reviewed - daily physical activity /  exercise (at least 15-30 minutes) - eat more plants /  vegetables - increase social activities, brain stimulation, games, puzzles, hobbies, crafts, arts, music - aim for at least 7-8 hours sleep per night (or more) - avoid smoking and alcohol - caregiver resources provided - caution with medications, finances; no driving  Meds ordered this encounter  Medications   mirtazapine (REMERON) 7.5 MG tablet    Sig: Take 1 tablet (7.5 mg total) by mouth at bedtime.    Dispense:  30 tablet    Refill:  6   Return for pending if symptoms worsen or fail to improve.    Penni Bombard, MD 02/27/3402, 7:09 PM Certified in Neurology, Neurophysiology and Neuroimaging  [facility] [address] [place], [place] [address] [phone]

## 2022-02-27 DIAGNOSIS — N1832 Chronic kidney disease, stage 3b: Secondary | ICD-10-CM | POA: Diagnosis not present

## 2022-02-27 DIAGNOSIS — E211 Secondary hyperparathyroidism, not elsewhere classified: Secondary | ICD-10-CM | POA: Diagnosis not present

## 2022-02-27 DIAGNOSIS — E559 Vitamin D deficiency, unspecified: Secondary | ICD-10-CM | POA: Diagnosis not present

## 2022-02-27 DIAGNOSIS — D638 Anemia in other chronic diseases classified elsewhere: Secondary | ICD-10-CM | POA: Diagnosis not present

## 2022-02-27 DIAGNOSIS — I5032 Chronic diastolic (congestive) heart failure: Secondary | ICD-10-CM | POA: Diagnosis not present

## 2022-02-27 DIAGNOSIS — E875 Hyperkalemia: Secondary | ICD-10-CM | POA: Diagnosis not present

## 2022-02-27 DIAGNOSIS — I129 Hypertensive chronic kidney disease with stage 1 through stage 4 chronic kidney disease, or unspecified chronic kidney disease: Secondary | ICD-10-CM | POA: Diagnosis not present

## 2022-03-05 DIAGNOSIS — R894 Abnormal immunological findings in specimens from other organs, systems and tissues: Secondary | ICD-10-CM | POA: Diagnosis not present

## 2022-03-05 DIAGNOSIS — I129 Hypertensive chronic kidney disease with stage 1 through stage 4 chronic kidney disease, or unspecified chronic kidney disease: Secondary | ICD-10-CM | POA: Diagnosis not present

## 2022-03-05 DIAGNOSIS — I5032 Chronic diastolic (congestive) heart failure: Secondary | ICD-10-CM | POA: Diagnosis not present

## 2022-03-05 DIAGNOSIS — E211 Secondary hyperparathyroidism, not elsewhere classified: Secondary | ICD-10-CM | POA: Diagnosis not present

## 2022-03-05 DIAGNOSIS — N184 Chronic kidney disease, stage 4 (severe): Secondary | ICD-10-CM | POA: Diagnosis not present

## 2022-04-14 DIAGNOSIS — E890 Postprocedural hypothyroidism: Secondary | ICD-10-CM | POA: Diagnosis not present

## 2022-04-15 LAB — TSH: TSH: 3.19 u[IU]/mL (ref 0.450–4.500)

## 2022-04-15 LAB — T4, FREE: Free T4: 0.95 ng/dL (ref 0.82–1.77)

## 2022-04-23 ENCOUNTER — Encounter: Payer: Self-pay | Admitting: Nurse Practitioner

## 2022-04-23 ENCOUNTER — Ambulatory Visit (INDEPENDENT_AMBULATORY_CARE_PROVIDER_SITE_OTHER): Payer: Medicare Other | Admitting: Nurse Practitioner

## 2022-04-23 VITALS — BP 149/73 | HR 64 | Ht 64.5 in | Wt 157.0 lb

## 2022-04-23 DIAGNOSIS — E890 Postprocedural hypothyroidism: Secondary | ICD-10-CM | POA: Diagnosis not present

## 2022-04-23 MED ORDER — LEVOTHYROXINE SODIUM 88 MCG PO TABS: 88.0000 ug | ORAL_TABLET | Freq: Every day | ORAL | 3 refills | Status: DC

## 2022-04-23 NOTE — Patient Instructions (Signed)

## 2022-04-23 NOTE — Progress Notes (Signed)
04/23/2022       Endocrinology follow-up note   Subjective:    Patient ID: Lauren [name], female    DOB: June 20, 1934, PCP Redmond School, MD   Past Medical History:  Diagnosis Date   Anxiety disorder    Asthma    Atherosclerotic heart disease of native coronary artery without angina pectoris    Back pain    Benign neoplasm of unspecified breast    Chronic obstructive pulmonary disease, unspecified (HCC)    COPD (chronic obstructive pulmonary disease) ([place])    O2 at night sometimes   Dementia ([place])    Essential hypertension, benign    Gastro-esophageal reflux disease without esophagitis    GERD (gastroesophageal reflux disease)    History of cardiac catheterization    Reportedly normal coronaries in 2000   Hyperlipidemia    Hypertensive heart disease without heart failure    Hypothyroidism, unspecified    Insomnia, unspecified    Major depressive disorder, single episode, unspecified    Osteoarthritis    Other fatigue    Overactive bladder    Polyosteoarthritis, unspecified    Rectocele    Unspecified asthma, uncomplicated    Unspecified dementia without behavioral disturbance    Past Surgical History:  Procedure Laterality Date   ABDOMINAL HYSTERECTOMY  1994   ANKLE SURGERY     Rod and 13 screws   APPENDECTOMY     BREAST BIOPSY  1996   Benign   COLONOSCOPY  01/27/2011   Normal rectum/Sigmoid diverticula/Multiple colonic polyps removed (TUBULAR ADENOMA) Next TCS 01/2016   DORSAL COMPARTMENT RELEASE Left 02/03/2013   Procedure: RELEASE DORSAL COMPARTMENT (DEQUERVAIN);  Surgeon: Carole Civil, MD;  Location: [facility];  Service: Orthopedics;  Laterality: Left;   DORSAL COMPARTMENT RELEASE Right 09/11/2013   Procedure: RELEASE DORSAL COMPARTMENT (DEQUERVAIN);  Surgeon: Carole Civil, MD;  Location: [facility];  Service: Orthopedics;  Laterality: Right;   ESOPHAGEAL DILATION  08/04/2012   Procedure: ESOPHAGEAL DILATION;  Surgeon: Daneil Dolin, MD;  [facility];  Service: Endoscopy;;   ESOPHAGOGASTRODUODENOSCOPY  08/21/2003      Normal tubular esophagus status post passage of Maloney dilator as  described above/  2. Multiple linear antral erosions of uncertain significance, could be NSAID   ESOPHAGOGASTRODUODENOSCOPY  08/04/2012   Procedure: ESOPHAGOGASTRODUODENOSCOPY (EGD);  Surgeon: Daneil Dolin, MD;  [facility];  Service: Endoscopy;  Laterality: N/A;  7:30   ROTATOR CUFF REPAIR     SHOULDER SURGERY     Rotator Cuff   TRIGGER FINGER RELEASE Right 09/11/2013   Procedure: RIGHT LONG FINGER RELEASE TRIGGER FINGER;  Surgeon: Carole Civil, MD;  Location: [facility];  Service: Orthopedics;  Laterality: Right;   Social History   Socioeconomic History   Marital status: Divorced    Spouse name: Not on file   Number of children: 4   Years of education: 12   Highest education level: Not on file  Occupational History    Employer: RETIRED  Tobacco Use   Smoking status: Former    Packs/day: 0.50    Years: 25.00    Pack years: 12.50    Types: Cigarettes    Quit date: 01/31/1963    Years since quitting: 59.2   Smokeless tobacco: Never  Vaping Use   Vaping Use: Never used  Substance and Sexual Activity   Alcohol use: No    Alcohol/week: 0.0 standard drinks   Drug use: No   Sexual activity: Yes  Birth control/protection: Surgical  Other Topics Concern   Not on file  Social History Narrative   01/26/22 lives with daughter, [name]   Social Determinants of Health   Financial Resource Strain: Not on file  Food Insecurity: Not on file  Transportation Needs: Not on file  Physical Activity: Not on file  Stress: Not on file  Social Connections: Not on file   Outpatient Encounter Medications as of 04/23/2022  Medication Sig   aspirin 81 MG tablet Take 81 mg by mouth daily.   calcitRIOL (ROCALTROL) 0.25 MCG capsule Take by mouth.   docusate sodium (COLACE) 100 MG capsule Take 100 mg by mouth 2 (two) times daily.    DULoxetine (CYMBALTA) 60 MG capsule Take 60 mg by mouth daily.   ferrous sulfate 325 (65 FE) MG tablet Take 325 mg by mouth daily with breakfast.   levothyroxine (SYNTHROID) 88 MCG tablet Take 1 tablet (88 mcg total) by mouth daily before breakfast.   mirtazapine (REMERON) 7.5 MG tablet Take 1 tablet (7.5 mg total) by mouth at bedtime.   QUEtiapine (SEROQUEL) 50 MG tablet Take 50 mg by mouth at bedtime.   torsemide (DEMADEX) 20 MG tablet Take 20 mg by mouth daily.   [DISCONTINUED] levothyroxine (SYNTHROID) 88 MCG tablet Take 1 tablet (88 mcg total) by mouth daily before breakfast.   No facility-administered encounter medications on file as of 04/23/2022.   ALLERGIES: Allergies  Allergen Reactions   Penicillins Rash and Other (See Comments)    Unknown 05/21/20 pt denies   VACCINATION STATUS: Immunization History  Administered Date(s) Administered   Influenza Inj Mdck Quad Pf 09/14/2016   Influenza Split 08/24/2015   Influenza, High Dose Seasonal PF 08/23/2015, 08/12/2017, 08/22/2018   Influenza,inj,Quad PF,6+ Mos 08/28/2019   Influenza-Unspecified 08/25/2012, 09/27/2013, 08/21/2014   Pneumococcal Conjugate-13 10/12/2019   Pneumococcal Polysaccharide-23 08/23/2010, 08/12/2017   Zoster Recombinat (Shingrix) 11/07/2019    Thyroid Problem Presents for follow-up visit. Patient reports no anxiety, cold intolerance, constipation, depressed mood, diarrhea, fatigue, hair loss, heat intolerance, leg swelling, palpitations, tremors, weight gain or weight loss. The symptoms have been stable.   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 She is on Levothyroxine 88 mcg p.o. daily before breakfast.    - She is accompanied by her grown daughter, her primary caregiver due to her dementia.  She has steady weight has gained some since last visit- daughter reports good appetite.    - She denies palpitations, heat intolerance, tremors.     Review of systems  Constitutional: + steadily  increasing body weight,  current Body mass index is 26.53 kg/m. , no fatigue, no subjective hyperthermia, no subjective hypothermia Eyes: no blurry vision, no xerophthalmia ENT: no sore throat, no nodules palpated in throat, no dysphagia/odynophagia, no hoarseness Cardiovascular: no chest pain, no shortness of breath, no palpitations, no leg swelling Respiratory: no cough, no shortness of breath Gastrointestinal: no nausea/vomiting/diarrhea Musculoskeletal: generalized muscle/joint aches- hx arthritis Skin: no rashes, no hyperemia Neurological: no tremors, no numbness, no tingling, no dizziness Psychiatric: no depression, no anxiety, + dementia with short-term memory deficits  Objective:    BP (!) 149/73   Pulse 64   Ht 5' 4.5" (1.638 m)   Wt 157 lb (71.2 kg)   BMI 26.53 kg/m   Wt Readings from Last 3 Encounters:  04/23/22 157 lb (71.2 kg)  01/26/22 146 lb 9.6 oz (66.5 kg)  10/23/21 136 lb (61.7 kg)    BP Readings from Last  3 Encounters:  04/23/22 (!) 149/73  01/26/22 (!) 165/81  10/23/21 133/82    Physical Exam- Limited  Constitutional:  Body mass index is 26.53 kg/m. , not in acute distress, forgetful- advanced dementia Eyes:  EOMI, no exophthalmos Neck: Supple Cardiovascular: RRR, no murmurs, rubs, or gallops, no edema Respiratory: Adequate breathing efforts, no crackles, rales, rhonchi, or wheezing Musculoskeletal: no gross deformities, strength intact in all four extremities, no gross restriction of joint movements Skin:  no rashes, no hyperemia Neurological: no tremor with outstretched hands   Recent Results (from the past 2160 hour(s))  TSH     Status: None   Collection Time: 04/14/22  8:41 AM  Result Value Ref Range   TSH 3.190 0.450 - 4.500 uIU/mL  T4, free     Status: None   Collection Time: 04/14/22  8:41 AM  Result Value Ref Range   Free T4 0.95 0.82 - 1.77 ng/dL      Assessment & Plan:   1. RAI induced Hypothyroidism -  -Her previsit thyroid  function tests are consistent with slight under replacement, however I do not believe she could tolerate jumping to the 100 mcg dose based on her previous trials.   Therefore, she is advised to continue Levothyroxine 88 mcg po daily before breakfast.    - We discussed about the correct intake of her thyroid hormone, on empty stomach at fasting, with water, separated by at least 30 minutes from breakfast and other medications,  and separated by more than 4 hours from calcium, iron, multivitamins, acid reflux medications (PPIs). -Patient is made aware of the fact that thyroid hormone replacement is needed for life, dose to be adjusted by periodic monitoring of thyroid function tests.    - I advised patient to maintain close follow up with Redmond School, MD for primary care needs.      I spent 22 minutes in the care of the patient today including review of labs from Thyroid Function, CMP, and other relevant labs ; imaging/biopsy records (current and previous including abstractions from other facilities); face-to-face time discussing  her lab results and symptoms, medications doses, her options of short and long term treatment based on the latest standards of care / guidelines;   and documenting the encounter.  Lauren [name]  participated in the discussions, expressed understanding, and voiced agreement with the above plans.  All questions were answered to her satisfaction. she is encouraged to contact clinic should she have any questions or concerns prior to her return visit.    Follow up plan: Return in about 4 months (around 08/23/2022) for Thyroid follow up, Previsit labs.   Rayetta Pigg, [facility] [address] [place], [place] [address] Phone: [phone] Fax: [phone]  04/23/2022, 8:22 AM

## 2022-05-01 DIAGNOSIS — I5032 Chronic diastolic (congestive) heart failure: Secondary | ICD-10-CM | POA: Diagnosis not present

## 2022-05-01 DIAGNOSIS — N184 Chronic kidney disease, stage 4 (severe): Secondary | ICD-10-CM | POA: Diagnosis not present

## 2022-05-01 DIAGNOSIS — E211 Secondary hyperparathyroidism, not elsewhere classified: Secondary | ICD-10-CM | POA: Diagnosis not present

## 2022-05-01 DIAGNOSIS — R894 Abnormal immunological findings in specimens from other organs, systems and tissues: Secondary | ICD-10-CM | POA: Diagnosis not present

## 2022-05-01 DIAGNOSIS — I129 Hypertensive chronic kidney disease with stage 1 through stage 4 chronic kidney disease, or unspecified chronic kidney disease: Secondary | ICD-10-CM | POA: Diagnosis not present

## 2022-05-07 DIAGNOSIS — I129 Hypertensive chronic kidney disease with stage 1 through stage 4 chronic kidney disease, or unspecified chronic kidney disease: Secondary | ICD-10-CM | POA: Diagnosis not present

## 2022-05-07 DIAGNOSIS — D638 Anemia in other chronic diseases classified elsewhere: Secondary | ICD-10-CM | POA: Diagnosis not present

## 2022-05-07 DIAGNOSIS — E211 Secondary hyperparathyroidism, not elsewhere classified: Secondary | ICD-10-CM | POA: Diagnosis not present

## 2022-05-07 DIAGNOSIS — I5032 Chronic diastolic (congestive) heart failure: Secondary | ICD-10-CM | POA: Diagnosis not present

## 2022-05-07 DIAGNOSIS — R051 Acute cough: Secondary | ICD-10-CM | POA: Diagnosis not present

## 2022-05-07 DIAGNOSIS — N184 Chronic kidney disease, stage 4 (severe): Secondary | ICD-10-CM | POA: Diagnosis not present

## 2022-07-02 DIAGNOSIS — E663 Overweight: Secondary | ICD-10-CM | POA: Diagnosis not present

## 2022-07-02 DIAGNOSIS — Z6826 Body mass index (BMI) 26.0-26.9, adult: Secondary | ICD-10-CM | POA: Diagnosis not present

## 2022-07-02 DIAGNOSIS — F03A0 Unspecified dementia, mild, without behavioral disturbance, psychotic disturbance, mood disturbance, and anxiety: Secondary | ICD-10-CM | POA: Diagnosis not present

## 2022-07-13 ENCOUNTER — Telehealth: Payer: Self-pay | Admitting: *Deleted

## 2022-07-13 ENCOUNTER — Encounter: Payer: Self-pay | Admitting: *Deleted

## 2022-07-13 NOTE — Patient Outreach (Signed)
  Care Coordination   Initial Visit Note   07/13/2022 Name: Lauren [name] MRN: [id number] DOB: 01-20-1934  Lauren [name] is a 87 y.o. year old female who sees Lauren School, MD for primary care. I  spoke with patient's daughter, Lauren [name], by telephone.  What matters to the patients health and wellness today?  No concerns at this time. AWV with PharmD at Dr Lauren [name] office last week. Per daughter, no additional needs were identified at that visit but will f/u if any develop.  SDOH assessments and interventions completed:  Yes  SDOH Interventions Today    Flowsheet Row Most Recent Value  SDOH Interventions   Housing Interventions Intervention Not Indicated  Transportation Interventions Intervention Not Indicated        Care Coordination Interventions Activated:  No  Care Coordination Interventions:  No, not indicated   Follow up plan: No further intervention required.   Encounter Outcome:  Pt. Visit Completed   Lauren [name], BSN, RN-BC RN Care Coordinator Direct Dial: [phone]

## 2022-07-20 ENCOUNTER — Other Ambulatory Visit: Payer: Self-pay | Admitting: Diagnostic Neuroimaging

## 2022-07-29 DIAGNOSIS — F03B11 Unspecified dementia, moderate, with agitation: Secondary | ICD-10-CM | POA: Diagnosis not present

## 2022-07-29 DIAGNOSIS — Z515 Encounter for palliative care: Secondary | ICD-10-CM | POA: Diagnosis not present

## 2022-08-05 DIAGNOSIS — I129 Hypertensive chronic kidney disease with stage 1 through stage 4 chronic kidney disease, or unspecified chronic kidney disease: Secondary | ICD-10-CM | POA: Diagnosis not present

## 2022-08-05 DIAGNOSIS — I5032 Chronic diastolic (congestive) heart failure: Secondary | ICD-10-CM | POA: Diagnosis not present

## 2022-08-05 DIAGNOSIS — D638 Anemia in other chronic diseases classified elsewhere: Secondary | ICD-10-CM | POA: Diagnosis not present

## 2022-08-05 DIAGNOSIS — E211 Secondary hyperparathyroidism, not elsewhere classified: Secondary | ICD-10-CM | POA: Diagnosis not present

## 2022-08-05 DIAGNOSIS — N184 Chronic kidney disease, stage 4 (severe): Secondary | ICD-10-CM | POA: Diagnosis not present

## 2022-08-05 DIAGNOSIS — R051 Acute cough: Secondary | ICD-10-CM | POA: Diagnosis not present

## 2022-08-13 DIAGNOSIS — I129 Hypertensive chronic kidney disease with stage 1 through stage 4 chronic kidney disease, or unspecified chronic kidney disease: Secondary | ICD-10-CM | POA: Diagnosis not present

## 2022-08-13 DIAGNOSIS — R894 Abnormal immunological findings in specimens from other organs, systems and tissues: Secondary | ICD-10-CM | POA: Diagnosis not present

## 2022-08-13 DIAGNOSIS — N184 Chronic kidney disease, stage 4 (severe): Secondary | ICD-10-CM | POA: Diagnosis not present

## 2022-08-13 DIAGNOSIS — E211 Secondary hyperparathyroidism, not elsewhere classified: Secondary | ICD-10-CM | POA: Diagnosis not present

## 2022-08-13 DIAGNOSIS — I5032 Chronic diastolic (congestive) heart failure: Secondary | ICD-10-CM | POA: Diagnosis not present

## 2022-08-13 DIAGNOSIS — D638 Anemia in other chronic diseases classified elsewhere: Secondary | ICD-10-CM | POA: Diagnosis not present

## 2022-08-24 ENCOUNTER — Ambulatory Visit: Payer: Medicare Other | Admitting: Nurse Practitioner

## 2022-09-01 DIAGNOSIS — F03B11 Unspecified dementia, moderate, with agitation: Secondary | ICD-10-CM | POA: Diagnosis not present

## 2022-09-01 DIAGNOSIS — Z515 Encounter for palliative care: Secondary | ICD-10-CM | POA: Diagnosis not present

## 2022-09-04 DIAGNOSIS — E890 Postprocedural hypothyroidism: Secondary | ICD-10-CM | POA: Diagnosis not present

## 2022-09-05 LAB — T4, FREE: Free T4: 1.11 ng/dL (ref 0.82–1.77)

## 2022-09-05 LAB — TSH: TSH: 4.05 u[IU]/mL (ref 0.450–4.500)

## 2022-09-09 ENCOUNTER — Ambulatory Visit (INDEPENDENT_AMBULATORY_CARE_PROVIDER_SITE_OTHER): Payer: Medicare Other | Admitting: Nurse Practitioner

## 2022-09-09 ENCOUNTER — Encounter: Payer: Self-pay | Admitting: Nurse Practitioner

## 2022-09-09 VITALS — BP 156/74 | HR 74 | Ht 64.5 in | Wt 162.0 lb

## 2022-09-09 DIAGNOSIS — E890 Postprocedural hypothyroidism: Secondary | ICD-10-CM

## 2022-09-09 MED ORDER — LEVOTHYROXINE SODIUM 88 MCG PO TABS: 88.0000 ug | ORAL_TABLET | Freq: Every day | ORAL | 3 refills | Status: AC

## 2022-09-09 NOTE — Progress Notes (Signed)
09/09/2022       Endocrinology follow-up note   Subjective:    Patient ID: Lauren [name], female    DOB: [birth year], PCP Redmond School, MD   Past Medical History:  Diagnosis Date   Anxiety disorder    Asthma    Atherosclerotic heart disease of native coronary artery without angina pectoris    Back pain    Benign neoplasm of unspecified breast    Chronic obstructive pulmonary disease, unspecified (HCC)    COPD (chronic obstructive pulmonary disease) ([place])    O2 at night sometimes   Dementia ([place])    Essential hypertension, benign    Gastro-esophageal reflux disease without esophagitis    GERD (gastroesophageal reflux disease)    History of cardiac catheterization    Reportedly normal coronaries in 2000   Hyperlipidemia    Hypertensive heart disease without heart failure    Hypothyroidism, unspecified    Insomnia, unspecified    Major depressive disorder, single episode, unspecified    Osteoarthritis    Other fatigue    Overactive bladder    Polyosteoarthritis, unspecified    Rectocele    Unspecified asthma, uncomplicated    Unspecified dementia without behavioral disturbance    Past Surgical History:  Procedure Laterality Date   ABDOMINAL HYSTERECTOMY  1994   ANKLE SURGERY     Rod and 13 screws   APPENDECTOMY     BREAST BIOPSY  1996   Benign   COLONOSCOPY  01/27/2011   Normal rectum/Sigmoid diverticula/Multiple colonic polyps removed (TUBULAR ADENOMA) Next TCS 01/2016   DORSAL COMPARTMENT RELEASE Left 02/03/2013   Procedure: RELEASE DORSAL COMPARTMENT (DEQUERVAIN);  Surgeon: Carole Civil, MD;  Location: [facility];  Service: Orthopedics;  Laterality: Left;   DORSAL COMPARTMENT RELEASE Right 09/11/2013   Procedure: RELEASE DORSAL COMPARTMENT (DEQUERVAIN);  Surgeon: Carole Civil, MD;  Location: [facility];  Service: Orthopedics;  Laterality: Right;   ESOPHAGEAL DILATION  08/04/2012   Procedure: ESOPHAGEAL DILATION;  Surgeon: Daneil Dolin, MD;  Location: [facility];  Service: Endoscopy;;   ESOPHAGOGASTRODUODENOSCOPY  08/21/2003      Normal tubular esophagus status post passage of Maloney dilator as  described above/  2. Multiple linear antral erosions of uncertain significance, could be NSAID   ESOPHAGOGASTRODUODENOSCOPY  08/04/2012   Procedure: ESOPHAGOGASTRODUODENOSCOPY (EGD);  Surgeon: Daneil Dolin, MD;  Location: [facility];  Service: Endoscopy;  Laterality: N/A;  7:30   ROTATOR CUFF REPAIR     SHOULDER SURGERY     Rotator Cuff   TRIGGER FINGER RELEASE Right 09/11/2013   Procedure: RIGHT LONG FINGER RELEASE TRIGGER FINGER;  Surgeon: Carole Civil, MD;  Location: [facility];  Service: Orthopedics;  Laterality: Right;   Social History   Socioeconomic History   Marital status: Divorced    Spouse name: Not on file   Number of children: 4   Years of education: 12   Highest education level: Not on file  Occupational History    Employer: RETIRED  Tobacco Use   Smoking status: Former    Packs/day: 0.50    Years: 25.00    Total pack years: 12.50    Types: Cigarettes    Quit date: 01/31/1963    Years since quitting: 59.6   Smokeless tobacco: Never  Vaping Use   Vaping Use: Never used  Substance and Sexual Activity   Alcohol use: No    Alcohol/week: 0.0 standard drinks of alcohol   Drug use: No   Sexual activity: Yes  Birth control/protection: Surgical  Other Topics Concern   Not on file  Social History Narrative   01/26/22 lives with daughter, [name]   Social Determinants of Health   Financial Resource Strain: Not on file  Food Insecurity: Not on file  Transportation Needs: No Transportation Needs (07/13/2022)   PRAPARE - Hydrologist (Medical): No    Lack of Transportation (Non-Medical): No  Physical Activity: Not on file  Stress: Not on file  Social Connections: Not on file   Outpatient Encounter Medications as of 09/09/2022  Medication Sig   aspirin 81 MG tablet Take 81 mg by mouth  daily.   calcitRIOL (ROCALTROL) 0.25 MCG capsule Take by mouth.   docusate sodium (COLACE) 100 MG capsule Take 100 mg by mouth 2 (two) times daily.   DULoxetine (CYMBALTA) 60 MG capsule Take 60 mg by mouth daily.   ferrous sulfate 325 (65 FE) MG tablet Take 325 mg by mouth daily with breakfast.   mirtazapine (REMERON) 7.5 MG tablet TAKE 1 TABLET BY MOUTH DAILY AT BEDTIME.   QUEtiapine (SEROQUEL) 50 MG tablet Take 50 mg by mouth at bedtime.   torsemide (DEMADEX) 20 MG tablet Take 20 mg by mouth daily.   [DISCONTINUED] levothyroxine (SYNTHROID) 88 MCG tablet Take 1 tablet (88 mcg total) by mouth daily before breakfast.   levothyroxine (SYNTHROID) 88 MCG tablet Take 1 tablet (88 mcg total) by mouth daily before breakfast.   No facility-administered encounter medications on file as of 09/09/2022.   ALLERGIES: Allergies  Allergen Reactions   Penicillins Rash and Other (See Comments)    Unknown 05/21/20 pt denies   VACCINATION STATUS: Immunization History  Administered Date(s) Administered   Influenza Inj Mdck Quad Pf 09/14/2016   Influenza Split 08/24/2015   Influenza, High Dose Seasonal PF 08/23/2015, 08/12/2017, 08/22/2018   Influenza,inj,Quad PF,6+ Mos 08/28/2019   Influenza-Unspecified 08/25/2012, 09/27/2013, 08/21/2014   Pneumococcal Conjugate-13 10/12/2019   Pneumococcal Polysaccharide-23 08/23/2010, 08/12/2017   Zoster Recombinat (Shingrix) 11/07/2019    Thyroid Problem Presents for follow-up visit. Patient reports no anxiety, cold intolerance, constipation, depressed mood, diarrhea, fatigue, hair loss, heat intolerance, leg swelling, palpitations, tremors, weight gain or weight loss. The symptoms have been stable.    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 She is on Levothyroxine 88 mcg p.o. daily before breakfast.    - She is accompanied by her grown daughter, her primary caregiver due to her dementia.  She has gained some weight since last visit- daughter  reports good appetite (maybe due to Seroquel?).  Daughter reports she is now on palliative care due to worsening dementia.    - She denies palpitations, heat intolerance, tremors.     Review of systems  Constitutional: + steadily increasing body weight-appetite has improved recently,  current Body mass index is 27.38 kg/m. , no fatigue, no subjective hyperthermia, no subjective hypothermia Eyes: no blurry vision, no xerophthalmia ENT: no sore throat, no nodules palpated in throat, no dysphagia/odynophagia, no hoarseness Cardiovascular: no chest pain, no shortness of breath, no palpitations, no leg swelling Respiratory: no cough, no shortness of breath Gastrointestinal: no nausea/vomiting/diarrhea Musculoskeletal: generalized muscle/joint aches- hx arthritis Skin: no rashes, no hyperemia Neurological: no tremors, no numbness, no tingling, no dizziness Psychiatric: no depression, no anxiety, + dementia with short-term memory deficits (now on palliative care)  Objective:    BP (!) 156/74 (BP Location: Right Arm, Patient Position: Sitting, Cuff Size: Normal)   Pulse 74  Ht 5' 4.5" (1.638 m)   Wt 162 lb (73.5 kg)   BMI 27.38 kg/m   Wt Readings from Last 3 Encounters:  09/09/22 162 lb (73.5 kg)  04/23/22 157 lb (71.2 kg)  01/26/22 146 lb 9.6 oz (66.5 kg)    BP Readings from Last 3 Encounters:  09/09/22 (!) 156/74  04/23/22 (!) 149/73  01/26/22 (!) 165/81    Physical Exam- Limited  Constitutional:  Body mass index is 27.38 kg/m. , not in acute distress, forgetful- advanced dementia (now on palliative care) Eyes:  EOMI, no exophthalmos Neck: Supple Cardiovascular: RRR, no murmurs, rubs, or gallops, no edema Respiratory: Adequate breathing efforts, no crackles, rales, rhonchi, or wheezing Musculoskeletal: no gross deformities, strength intact in all four extremities, no gross restriction of joint movements Skin:  no rashes, no hyperemia Neurological: no tremor with  outstretched hands   Recent Results (from the past 2160 hour(s))  TSH     Status: None   Collection Time: 09/04/22  8:36 AM  Result Value Ref Range   TSH 4.050 0.450 - 4.500 uIU/mL  T4, free     Status: None   Collection Time: 09/04/22  8:36 AM  Result Value Ref Range   Free T4 1.11 0.82 - 1.77 ng/dL    Latest Reference Range & Units 12/16/20 09:35 04/14/21 11:40 10/17/21 09:42 04/14/22 08:41 09/04/22 08:36  TSH 0.450 - 4.500 uIU/mL 0.339 (L) 0.349 (L) 1.650 3.190 4.050  T4,Free(Direct) 0.82 - 1.77 ng/dL 1.75 1.34 1.31 0.95 1.11  (L): Data is abnormally low   Assessment & Plan:   1. RAI induced Hypothyroidism -  -Her previsit thyroid function tests are consistent with appropriate hormone replacement.  Therefore, she is advised to continue Levothyroxine 88 mcg po daily before breakfast. She has been on this stable dose for quite some time.  And, since she is now on palliative care, will not need to see her routinely unless issues arise.   - We discussed about the correct intake of her thyroid hormone, on empty stomach at fasting, with water, separated by at least 30 minutes from breakfast and other medications,  and separated by more than 4 hours from calcium, iron, multivitamins, acid reflux medications (PPIs). -Patient is made aware of the fact that thyroid hormone replacement is needed for life, dose to be adjusted by periodic monitoring of thyroid function tests.    - I advised patient to maintain close follow up with Redmond School, MD for primary care needs.     I spent 20 minutes in the care of the patient today including review of labs from Thyroid Function, CMP, and other relevant labs ; imaging/biopsy records (current and previous including abstractions from other facilities); face-to-face time discussing  her lab results and symptoms, medications doses, her options of short and long term treatment based on the latest standards of care / guidelines;   and documenting the  encounter.  Lauren [name]  participated in the discussions, expressed understanding, and voiced agreement with the above plans.  All questions were answered to her satisfaction. she is encouraged to contact clinic should she have any questions or concerns prior to her return visit.    Follow up plan: Return if symptoms worsen or fail to improve, for Thyroid follow up.   Rayetta Pigg, [facility] [address] [place], [place] [address] Phone: [phone] Fax: [phone]  09/09/2022, 1:16 PM

## 2022-09-09 NOTE — Patient Instructions (Signed)

## 2022-09-17 ENCOUNTER — Other Ambulatory Visit: Payer: Self-pay | Admitting: Diagnostic Neuroimaging

## 2022-10-19 DIAGNOSIS — Z515 Encounter for palliative care: Secondary | ICD-10-CM | POA: Diagnosis not present

## 2022-10-19 DIAGNOSIS — F03B11 Unspecified dementia, moderate, with agitation: Secondary | ICD-10-CM | POA: Diagnosis not present

## 2022-12-01 DIAGNOSIS — N184 Chronic kidney disease, stage 4 (severe): Secondary | ICD-10-CM | POA: Diagnosis not present

## 2022-12-01 DIAGNOSIS — E211 Secondary hyperparathyroidism, not elsewhere classified: Secondary | ICD-10-CM | POA: Diagnosis not present

## 2022-12-01 DIAGNOSIS — I5032 Chronic diastolic (congestive) heart failure: Secondary | ICD-10-CM | POA: Diagnosis not present

## 2022-12-01 DIAGNOSIS — R894 Abnormal immunological findings in specimens from other organs, systems and tissues: Secondary | ICD-10-CM | POA: Diagnosis not present

## 2022-12-01 DIAGNOSIS — D638 Anemia in other chronic diseases classified elsewhere: Secondary | ICD-10-CM | POA: Diagnosis not present

## 2022-12-01 DIAGNOSIS — I129 Hypertensive chronic kidney disease with stage 1 through stage 4 chronic kidney disease, or unspecified chronic kidney disease: Secondary | ICD-10-CM | POA: Diagnosis not present

## 2022-12-14 DIAGNOSIS — I129 Hypertensive chronic kidney disease with stage 1 through stage 4 chronic kidney disease, or unspecified chronic kidney disease: Secondary | ICD-10-CM | POA: Diagnosis not present

## 2022-12-14 DIAGNOSIS — I5032 Chronic diastolic (congestive) heart failure: Secondary | ICD-10-CM | POA: Diagnosis not present

## 2022-12-14 DIAGNOSIS — E559 Vitamin D deficiency, unspecified: Secondary | ICD-10-CM | POA: Diagnosis not present

## 2022-12-14 DIAGNOSIS — D638 Anemia in other chronic diseases classified elsewhere: Secondary | ICD-10-CM | POA: Diagnosis not present

## 2022-12-14 DIAGNOSIS — R809 Proteinuria, unspecified: Secondary | ICD-10-CM | POA: Diagnosis not present

## 2022-12-14 DIAGNOSIS — N1832 Chronic kidney disease, stage 3b: Secondary | ICD-10-CM | POA: Diagnosis not present

## 2022-12-14 DIAGNOSIS — E211 Secondary hyperparathyroidism, not elsewhere classified: Secondary | ICD-10-CM | POA: Diagnosis not present

## 2022-12-22 DIAGNOSIS — F03B11 Unspecified dementia, moderate, with agitation: Secondary | ICD-10-CM | POA: Diagnosis not present

## 2022-12-22 DIAGNOSIS — Z515 Encounter for palliative care: Secondary | ICD-10-CM | POA: Diagnosis not present

## 2022-12-23 DIAGNOSIS — I130 Hypertensive heart and chronic kidney disease with heart failure and stage 1 through stage 4 chronic kidney disease, or unspecified chronic kidney disease: Secondary | ICD-10-CM | POA: Diagnosis not present

## 2022-12-23 DIAGNOSIS — E063 Autoimmune thyroiditis: Secondary | ICD-10-CM | POA: Diagnosis not present

## 2023-01-27 DIAGNOSIS — Z515 Encounter for palliative care: Secondary | ICD-10-CM | POA: Diagnosis not present

## 2023-01-27 DIAGNOSIS — F03B11 Unspecified dementia, moderate, with agitation: Secondary | ICD-10-CM | POA: Diagnosis not present

## 2023-03-12 DIAGNOSIS — F03B11 Unspecified dementia, moderate, with agitation: Secondary | ICD-10-CM | POA: Diagnosis not present

## 2023-03-12 DIAGNOSIS — Z515 Encounter for palliative care: Secondary | ICD-10-CM | POA: Diagnosis not present

## 2023-04-27 DIAGNOSIS — R809 Proteinuria, unspecified: Secondary | ICD-10-CM | POA: Diagnosis not present

## 2023-04-27 DIAGNOSIS — N1832 Chronic kidney disease, stage 3b: Secondary | ICD-10-CM | POA: Diagnosis not present

## 2023-04-27 DIAGNOSIS — E211 Secondary hyperparathyroidism, not elsewhere classified: Secondary | ICD-10-CM | POA: Diagnosis not present

## 2023-04-27 DIAGNOSIS — D638 Anemia in other chronic diseases classified elsewhere: Secondary | ICD-10-CM | POA: Diagnosis not present

## 2023-04-27 DIAGNOSIS — Z79899 Other long term (current) drug therapy: Secondary | ICD-10-CM | POA: Diagnosis not present

## 2023-04-27 DIAGNOSIS — N189 Chronic kidney disease, unspecified: Secondary | ICD-10-CM | POA: Diagnosis not present

## 2023-04-27 DIAGNOSIS — I129 Hypertensive chronic kidney disease with stage 1 through stage 4 chronic kidney disease, or unspecified chronic kidney disease: Secondary | ICD-10-CM | POA: Diagnosis not present

## 2023-05-03 DIAGNOSIS — I129 Hypertensive chronic kidney disease with stage 1 through stage 4 chronic kidney disease, or unspecified chronic kidney disease: Secondary | ICD-10-CM | POA: Diagnosis not present

## 2023-05-03 DIAGNOSIS — E875 Hyperkalemia: Secondary | ICD-10-CM | POA: Diagnosis not present

## 2023-05-03 DIAGNOSIS — I5032 Chronic diastolic (congestive) heart failure: Secondary | ICD-10-CM | POA: Diagnosis not present

## 2023-05-03 DIAGNOSIS — R809 Proteinuria, unspecified: Secondary | ICD-10-CM | POA: Diagnosis not present

## 2023-05-17 DIAGNOSIS — F03B11 Unspecified dementia, moderate, with agitation: Secondary | ICD-10-CM | POA: Diagnosis not present

## 2023-05-17 DIAGNOSIS — Z515 Encounter for palliative care: Secondary | ICD-10-CM | POA: Diagnosis not present

## 2023-06-12 IMAGING — US US RENAL
1 series · 14 of 25 positions shown · non-contrast
Comparison: CT abdomen pelvis 05/29/2005

CLINICAL DATA: Stage 3b chronic kidney disease

EXAM:
RENAL / URINARY TRACT ULTRASOUND COMPLETE

[Series 1: us renal · 0.21mm/px · 14 of 38 slices shown]
[im 1/38]
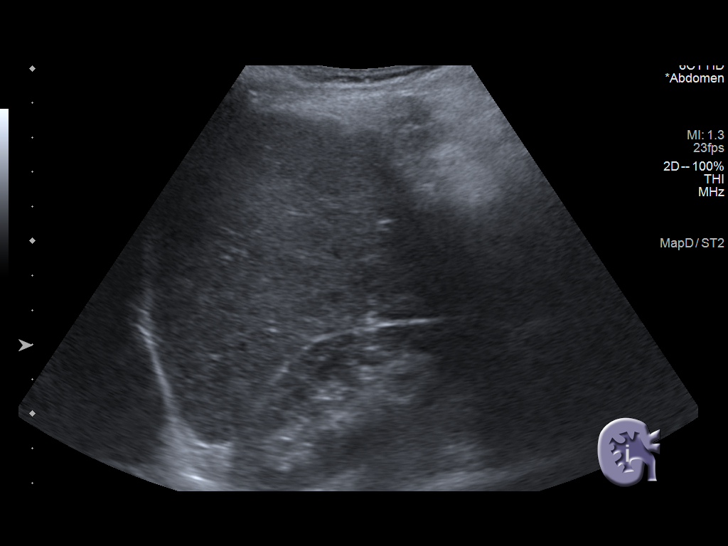
[im 4/38]
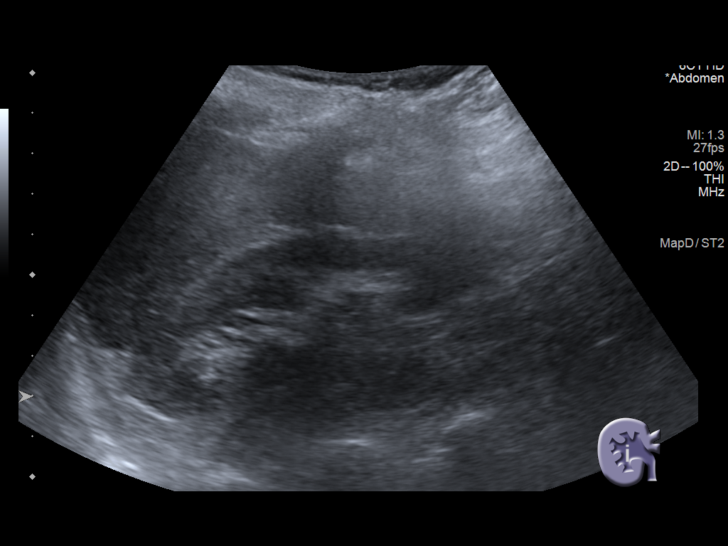
[im 7/38]
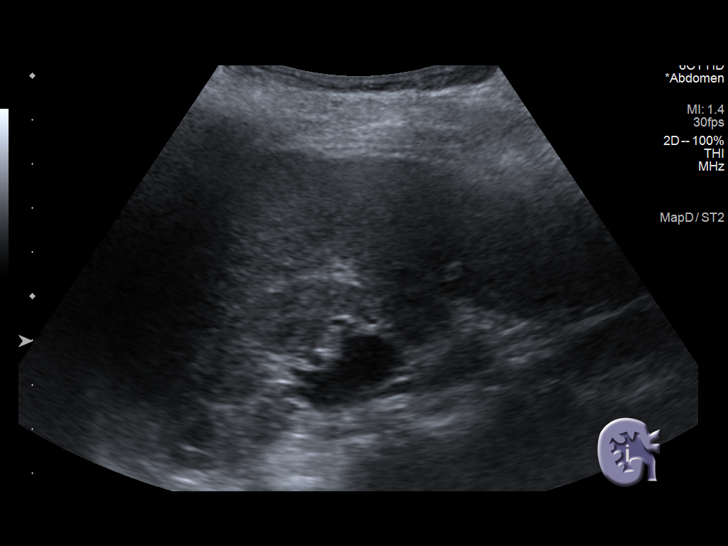
[im 10/38]
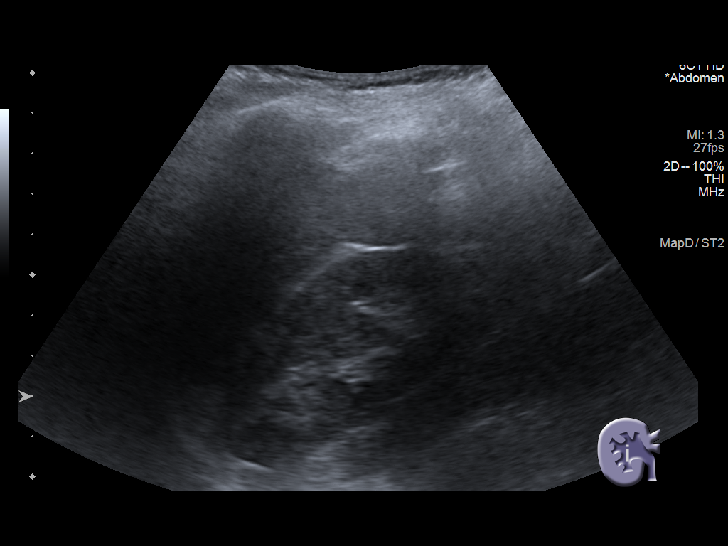
[im 13/38]
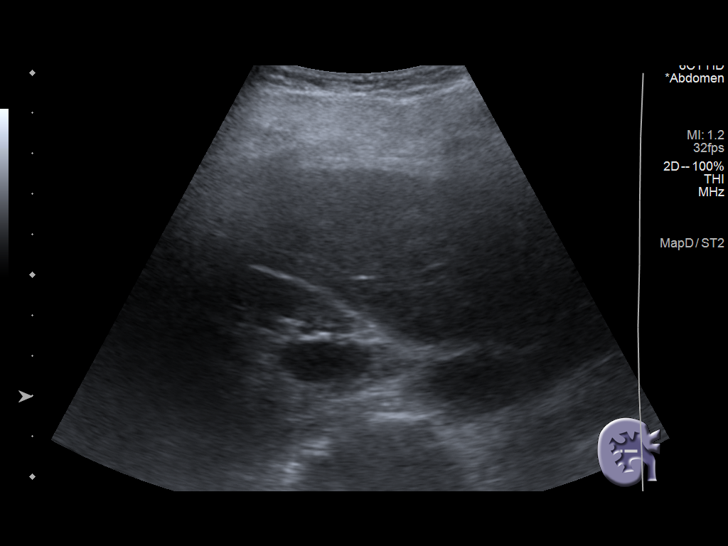
[im 14/38]
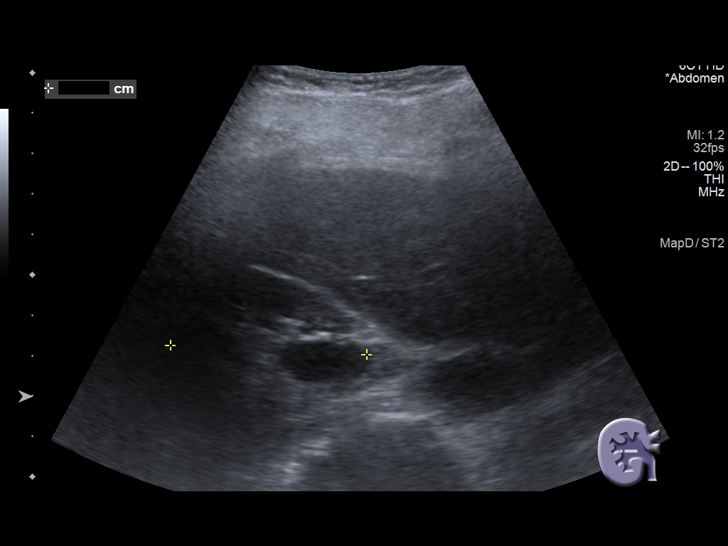
[im 17/38]
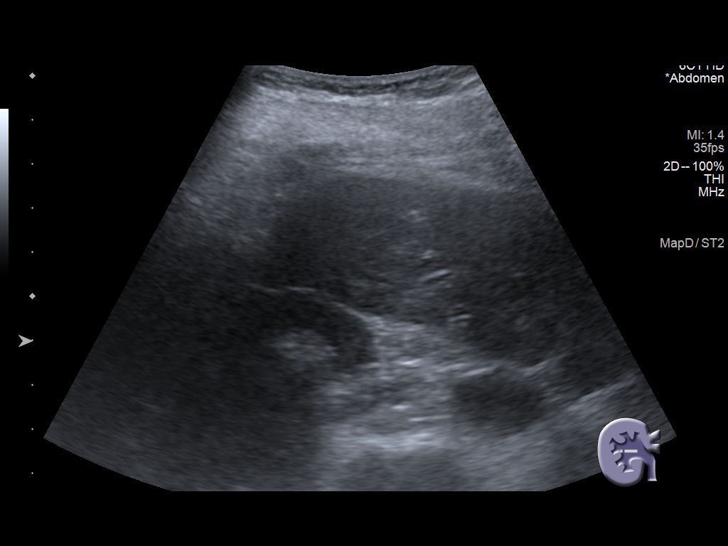
[im 21/38]
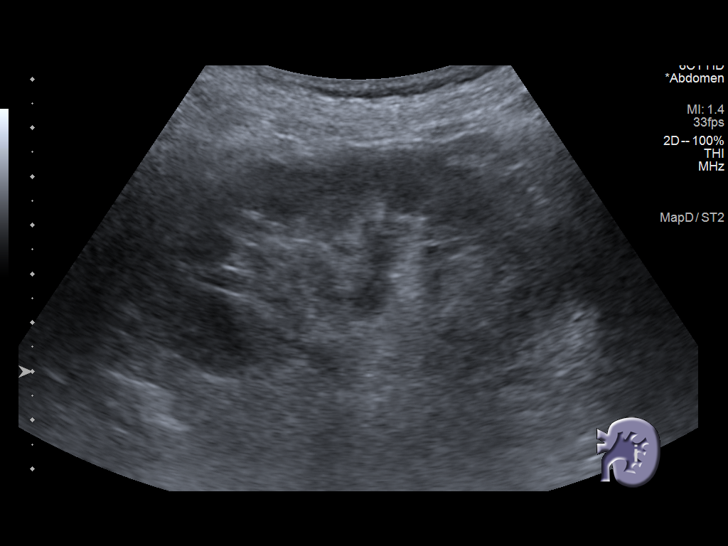
[im 24/38]
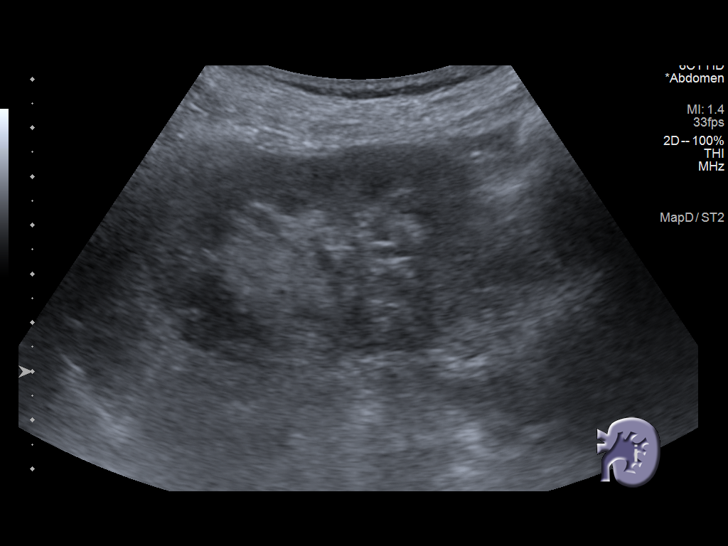
[im 25/38]
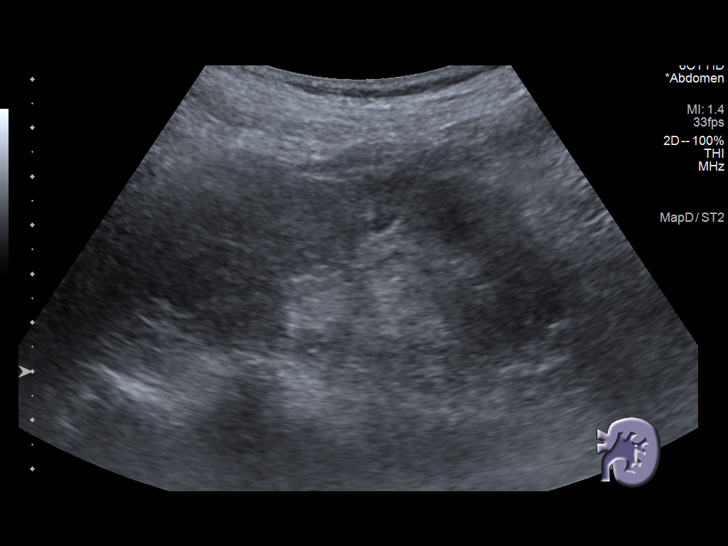
[im 28/38]
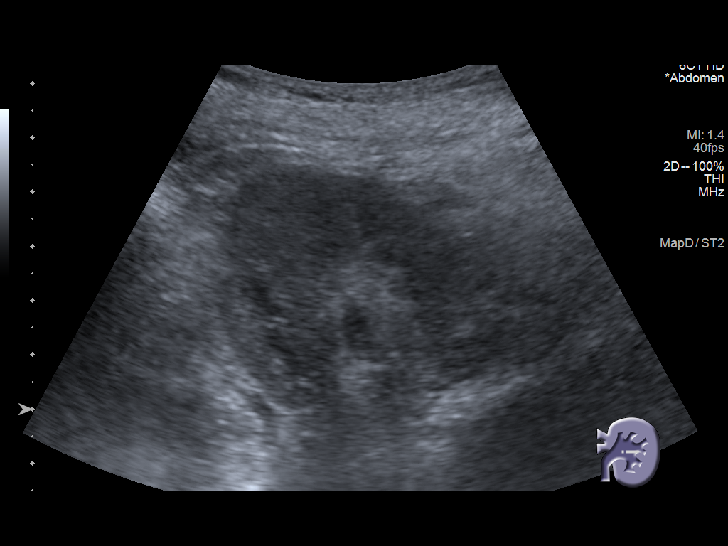
[im 31/38]
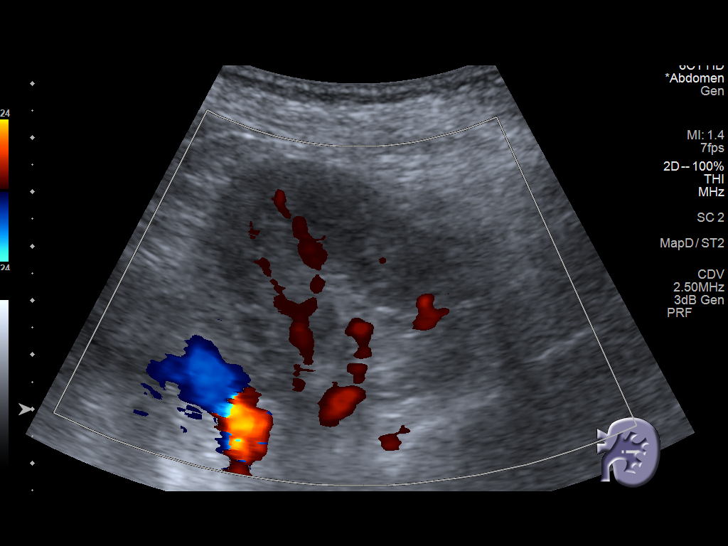
[im 34/38]
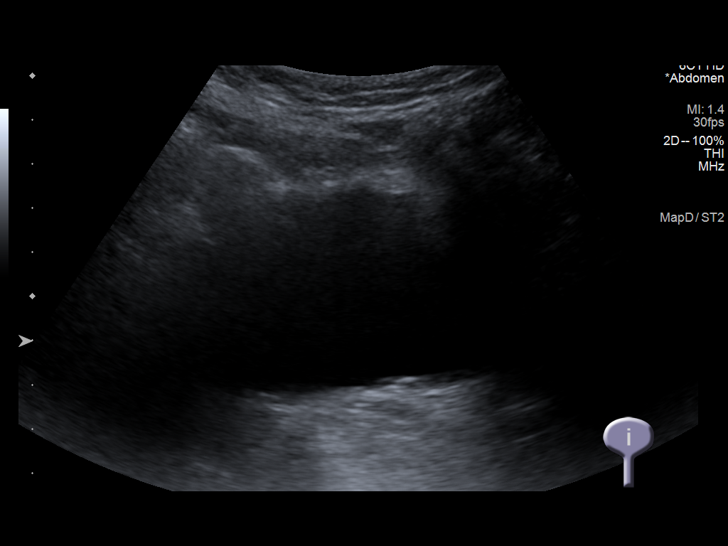
[im 38/38]
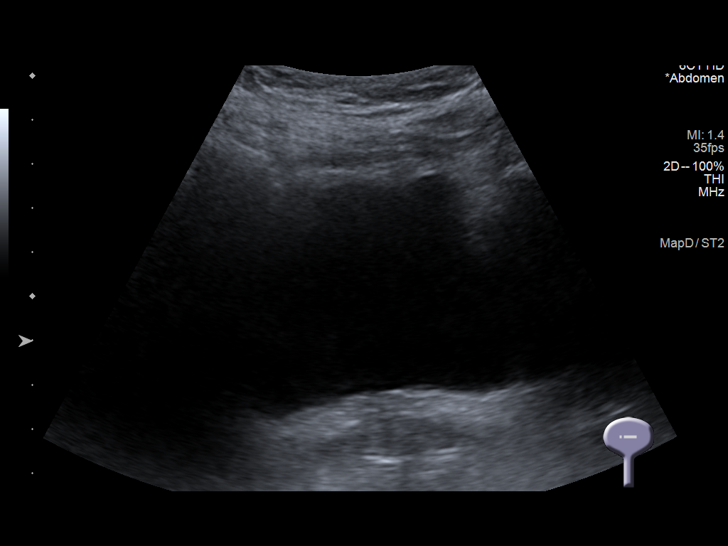

[14 of 25 positions shown; findings below may reference images not displayed]

FINDINGS: Right Kidney:

Renal measurements: 9.3 x 3.5 x 4.9 cm = volume: 82 mL. Echogenicity
increased. Mild fullness of the renal pelvis versus extrarenal
pelvis. No mass or hydronephrosis visualized.

Left Kidney:

Renal measurements: 9.2 x 4.3 x 4.4 cm = volume: 91 mL. Echogenicity
increased. No mass or hydronephrosis visualized.

Urinary bladder:

Appears normal for degree of bladder distention.

Other:

Suggestion increased hepatic echogenicity.
IMPRESSION: 1. Increased bilateral renal parenchymal echogenicity consistent
with known renal parenchymal disease.
2. Likely hepatic steatosis.

## 2023-06-22 DIAGNOSIS — Z515 Encounter for palliative care: Secondary | ICD-10-CM | POA: Diagnosis not present

## 2023-06-22 DIAGNOSIS — F03B11 Unspecified dementia, moderate, with agitation: Secondary | ICD-10-CM | POA: Diagnosis not present

## 2023-08-02 DIAGNOSIS — F03B11 Unspecified dementia, moderate, with agitation: Secondary | ICD-10-CM | POA: Diagnosis not present

## 2023-08-02 DIAGNOSIS — Z515 Encounter for palliative care: Secondary | ICD-10-CM | POA: Diagnosis not present

## 2023-08-03 DIAGNOSIS — F419 Anxiety disorder, unspecified: Secondary | ICD-10-CM | POA: Diagnosis not present

## 2023-08-03 DIAGNOSIS — I10 Essential (primary) hypertension: Secondary | ICD-10-CM | POA: Diagnosis not present

## 2023-08-03 DIAGNOSIS — J449 Chronic obstructive pulmonary disease, unspecified: Secondary | ICD-10-CM | POA: Diagnosis not present

## 2023-08-03 DIAGNOSIS — F329 Major depressive disorder, single episode, unspecified: Secondary | ICD-10-CM | POA: Diagnosis not present

## 2023-08-03 DIAGNOSIS — E039 Hypothyroidism, unspecified: Secondary | ICD-10-CM | POA: Diagnosis not present

## 2023-08-03 DIAGNOSIS — K219 Gastro-esophageal reflux disease without esophagitis: Secondary | ICD-10-CM | POA: Diagnosis not present

## 2023-08-03 DIAGNOSIS — N3281 Overactive bladder: Secondary | ICD-10-CM | POA: Diagnosis not present

## 2023-08-03 DIAGNOSIS — E782 Mixed hyperlipidemia: Secondary | ICD-10-CM | POA: Diagnosis not present

## 2023-08-03 DIAGNOSIS — I2510 Atherosclerotic heart disease of native coronary artery without angina pectoris: Secondary | ICD-10-CM | POA: Diagnosis not present

## 2023-08-03 DIAGNOSIS — G311 Senile degeneration of brain, not elsewhere classified: Secondary | ICD-10-CM | POA: Diagnosis not present

## 2023-08-03 DIAGNOSIS — D249 Benign neoplasm of unspecified breast: Secondary | ICD-10-CM | POA: Diagnosis not present

## 2023-08-03 DIAGNOSIS — M810 Age-related osteoporosis without current pathological fracture: Secondary | ICD-10-CM | POA: Diagnosis not present

## 2023-08-03 DIAGNOSIS — J45909 Unspecified asthma, uncomplicated: Secondary | ICD-10-CM | POA: Diagnosis not present

## 2023-08-06 DIAGNOSIS — J449 Chronic obstructive pulmonary disease, unspecified: Secondary | ICD-10-CM | POA: Diagnosis not present

## 2023-08-06 DIAGNOSIS — D249 Benign neoplasm of unspecified breast: Secondary | ICD-10-CM | POA: Diagnosis not present

## 2023-08-06 DIAGNOSIS — I2510 Atherosclerotic heart disease of native coronary artery without angina pectoris: Secondary | ICD-10-CM | POA: Diagnosis not present

## 2023-08-06 DIAGNOSIS — G311 Senile degeneration of brain, not elsewhere classified: Secondary | ICD-10-CM | POA: Diagnosis not present

## 2023-08-06 DIAGNOSIS — J45909 Unspecified asthma, uncomplicated: Secondary | ICD-10-CM | POA: Diagnosis not present

## 2023-08-06 DIAGNOSIS — F419 Anxiety disorder, unspecified: Secondary | ICD-10-CM | POA: Diagnosis not present

## 2023-08-09 DIAGNOSIS — J449 Chronic obstructive pulmonary disease, unspecified: Secondary | ICD-10-CM | POA: Diagnosis not present

## 2023-08-09 DIAGNOSIS — D249 Benign neoplasm of unspecified breast: Secondary | ICD-10-CM | POA: Diagnosis not present

## 2023-08-09 DIAGNOSIS — I2510 Atherosclerotic heart disease of native coronary artery without angina pectoris: Secondary | ICD-10-CM | POA: Diagnosis not present

## 2023-08-09 DIAGNOSIS — J45909 Unspecified asthma, uncomplicated: Secondary | ICD-10-CM | POA: Diagnosis not present

## 2023-08-09 DIAGNOSIS — F419 Anxiety disorder, unspecified: Secondary | ICD-10-CM | POA: Diagnosis not present

## 2023-08-09 DIAGNOSIS — G311 Senile degeneration of brain, not elsewhere classified: Secondary | ICD-10-CM | POA: Diagnosis not present

## 2023-08-10 DIAGNOSIS — D249 Benign neoplasm of unspecified breast: Secondary | ICD-10-CM | POA: Diagnosis not present

## 2023-08-10 DIAGNOSIS — F419 Anxiety disorder, unspecified: Secondary | ICD-10-CM | POA: Diagnosis not present

## 2023-08-10 DIAGNOSIS — G311 Senile degeneration of brain, not elsewhere classified: Secondary | ICD-10-CM | POA: Diagnosis not present

## 2023-08-10 DIAGNOSIS — J449 Chronic obstructive pulmonary disease, unspecified: Secondary | ICD-10-CM | POA: Diagnosis not present

## 2023-08-10 DIAGNOSIS — I2510 Atherosclerotic heart disease of native coronary artery without angina pectoris: Secondary | ICD-10-CM | POA: Diagnosis not present

## 2023-08-10 DIAGNOSIS — J45909 Unspecified asthma, uncomplicated: Secondary | ICD-10-CM | POA: Diagnosis not present

## 2023-08-12 DIAGNOSIS — J45909 Unspecified asthma, uncomplicated: Secondary | ICD-10-CM | POA: Diagnosis not present

## 2023-08-12 DIAGNOSIS — J449 Chronic obstructive pulmonary disease, unspecified: Secondary | ICD-10-CM | POA: Diagnosis not present

## 2023-08-12 DIAGNOSIS — F419 Anxiety disorder, unspecified: Secondary | ICD-10-CM | POA: Diagnosis not present

## 2023-08-12 DIAGNOSIS — D249 Benign neoplasm of unspecified breast: Secondary | ICD-10-CM | POA: Diagnosis not present

## 2023-08-12 DIAGNOSIS — I2510 Atherosclerotic heart disease of native coronary artery without angina pectoris: Secondary | ICD-10-CM | POA: Diagnosis not present

## 2023-08-12 DIAGNOSIS — G311 Senile degeneration of brain, not elsewhere classified: Secondary | ICD-10-CM | POA: Diagnosis not present

## 2023-08-13 DIAGNOSIS — F419 Anxiety disorder, unspecified: Secondary | ICD-10-CM | POA: Diagnosis not present

## 2023-08-13 DIAGNOSIS — J449 Chronic obstructive pulmonary disease, unspecified: Secondary | ICD-10-CM | POA: Diagnosis not present

## 2023-08-13 DIAGNOSIS — I2510 Atherosclerotic heart disease of native coronary artery without angina pectoris: Secondary | ICD-10-CM | POA: Diagnosis not present

## 2023-08-13 DIAGNOSIS — D249 Benign neoplasm of unspecified breast: Secondary | ICD-10-CM | POA: Diagnosis not present

## 2023-08-13 DIAGNOSIS — J45909 Unspecified asthma, uncomplicated: Secondary | ICD-10-CM | POA: Diagnosis not present

## 2023-08-13 DIAGNOSIS — G311 Senile degeneration of brain, not elsewhere classified: Secondary | ICD-10-CM | POA: Diagnosis not present

## 2023-08-16 DIAGNOSIS — I2510 Atherosclerotic heart disease of native coronary artery without angina pectoris: Secondary | ICD-10-CM | POA: Diagnosis not present

## 2023-08-16 DIAGNOSIS — D249 Benign neoplasm of unspecified breast: Secondary | ICD-10-CM | POA: Diagnosis not present

## 2023-08-16 DIAGNOSIS — F419 Anxiety disorder, unspecified: Secondary | ICD-10-CM | POA: Diagnosis not present

## 2023-08-16 DIAGNOSIS — G311 Senile degeneration of brain, not elsewhere classified: Secondary | ICD-10-CM | POA: Diagnosis not present

## 2023-08-16 DIAGNOSIS — J449 Chronic obstructive pulmonary disease, unspecified: Secondary | ICD-10-CM | POA: Diagnosis not present

## 2023-08-16 DIAGNOSIS — J45909 Unspecified asthma, uncomplicated: Secondary | ICD-10-CM | POA: Diagnosis not present

## 2023-08-17 DIAGNOSIS — J45909 Unspecified asthma, uncomplicated: Secondary | ICD-10-CM | POA: Diagnosis not present

## 2023-08-17 DIAGNOSIS — I2510 Atherosclerotic heart disease of native coronary artery without angina pectoris: Secondary | ICD-10-CM | POA: Diagnosis not present

## 2023-08-17 DIAGNOSIS — J449 Chronic obstructive pulmonary disease, unspecified: Secondary | ICD-10-CM | POA: Diagnosis not present

## 2023-08-17 DIAGNOSIS — D249 Benign neoplasm of unspecified breast: Secondary | ICD-10-CM | POA: Diagnosis not present

## 2023-08-17 DIAGNOSIS — G311 Senile degeneration of brain, not elsewhere classified: Secondary | ICD-10-CM | POA: Diagnosis not present

## 2023-08-17 DIAGNOSIS — F419 Anxiety disorder, unspecified: Secondary | ICD-10-CM | POA: Diagnosis not present

## 2023-08-19 DIAGNOSIS — F419 Anxiety disorder, unspecified: Secondary | ICD-10-CM | POA: Diagnosis not present

## 2023-08-19 DIAGNOSIS — J449 Chronic obstructive pulmonary disease, unspecified: Secondary | ICD-10-CM | POA: Diagnosis not present

## 2023-08-19 DIAGNOSIS — I2510 Atherosclerotic heart disease of native coronary artery without angina pectoris: Secondary | ICD-10-CM | POA: Diagnosis not present

## 2023-08-19 DIAGNOSIS — J45909 Unspecified asthma, uncomplicated: Secondary | ICD-10-CM | POA: Diagnosis not present

## 2023-08-19 DIAGNOSIS — D249 Benign neoplasm of unspecified breast: Secondary | ICD-10-CM | POA: Diagnosis not present

## 2023-08-19 DIAGNOSIS — G311 Senile degeneration of brain, not elsewhere classified: Secondary | ICD-10-CM | POA: Diagnosis not present

## 2023-08-20 DIAGNOSIS — J45909 Unspecified asthma, uncomplicated: Secondary | ICD-10-CM | POA: Diagnosis not present

## 2023-08-20 DIAGNOSIS — I2510 Atherosclerotic heart disease of native coronary artery without angina pectoris: Secondary | ICD-10-CM | POA: Diagnosis not present

## 2023-08-20 DIAGNOSIS — F419 Anxiety disorder, unspecified: Secondary | ICD-10-CM | POA: Diagnosis not present

## 2023-08-20 DIAGNOSIS — J449 Chronic obstructive pulmonary disease, unspecified: Secondary | ICD-10-CM | POA: Diagnosis not present

## 2023-08-20 DIAGNOSIS — G311 Senile degeneration of brain, not elsewhere classified: Secondary | ICD-10-CM | POA: Diagnosis not present

## 2023-08-20 DIAGNOSIS — D249 Benign neoplasm of unspecified breast: Secondary | ICD-10-CM | POA: Diagnosis not present

## 2023-08-23 DIAGNOSIS — G311 Senile degeneration of brain, not elsewhere classified: Secondary | ICD-10-CM | POA: Diagnosis not present

## 2023-08-23 DIAGNOSIS — J45909 Unspecified asthma, uncomplicated: Secondary | ICD-10-CM | POA: Diagnosis not present

## 2023-08-23 DIAGNOSIS — D249 Benign neoplasm of unspecified breast: Secondary | ICD-10-CM | POA: Diagnosis not present

## 2023-08-23 DIAGNOSIS — J449 Chronic obstructive pulmonary disease, unspecified: Secondary | ICD-10-CM | POA: Diagnosis not present

## 2023-08-23 DIAGNOSIS — I2510 Atherosclerotic heart disease of native coronary artery without angina pectoris: Secondary | ICD-10-CM | POA: Diagnosis not present

## 2023-08-23 DIAGNOSIS — F419 Anxiety disorder, unspecified: Secondary | ICD-10-CM | POA: Diagnosis not present

## 2023-08-24 DIAGNOSIS — J449 Chronic obstructive pulmonary disease, unspecified: Secondary | ICD-10-CM | POA: Diagnosis not present

## 2023-08-24 DIAGNOSIS — J45909 Unspecified asthma, uncomplicated: Secondary | ICD-10-CM | POA: Diagnosis not present

## 2023-08-24 DIAGNOSIS — E039 Hypothyroidism, unspecified: Secondary | ICD-10-CM | POA: Diagnosis not present

## 2023-08-24 DIAGNOSIS — N3281 Overactive bladder: Secondary | ICD-10-CM | POA: Diagnosis not present

## 2023-08-24 DIAGNOSIS — D249 Benign neoplasm of unspecified breast: Secondary | ICD-10-CM | POA: Diagnosis not present

## 2023-08-24 DIAGNOSIS — I2510 Atherosclerotic heart disease of native coronary artery without angina pectoris: Secondary | ICD-10-CM | POA: Diagnosis not present

## 2023-08-24 DIAGNOSIS — F329 Major depressive disorder, single episode, unspecified: Secondary | ICD-10-CM | POA: Diagnosis not present

## 2023-08-24 DIAGNOSIS — M810 Age-related osteoporosis without current pathological fracture: Secondary | ICD-10-CM | POA: Diagnosis not present

## 2023-08-24 DIAGNOSIS — K219 Gastro-esophageal reflux disease without esophagitis: Secondary | ICD-10-CM | POA: Diagnosis not present

## 2023-08-24 DIAGNOSIS — I10 Essential (primary) hypertension: Secondary | ICD-10-CM | POA: Diagnosis not present

## 2023-08-24 DIAGNOSIS — F419 Anxiety disorder, unspecified: Secondary | ICD-10-CM | POA: Diagnosis not present

## 2023-08-24 DIAGNOSIS — E782 Mixed hyperlipidemia: Secondary | ICD-10-CM | POA: Diagnosis not present

## 2023-08-24 DIAGNOSIS — G311 Senile degeneration of brain, not elsewhere classified: Secondary | ICD-10-CM | POA: Diagnosis not present

## 2023-08-25 DIAGNOSIS — J449 Chronic obstructive pulmonary disease, unspecified: Secondary | ICD-10-CM | POA: Diagnosis not present

## 2023-08-25 DIAGNOSIS — F419 Anxiety disorder, unspecified: Secondary | ICD-10-CM | POA: Diagnosis not present

## 2023-08-25 DIAGNOSIS — D249 Benign neoplasm of unspecified breast: Secondary | ICD-10-CM | POA: Diagnosis not present

## 2023-08-25 DIAGNOSIS — I2510 Atherosclerotic heart disease of native coronary artery without angina pectoris: Secondary | ICD-10-CM | POA: Diagnosis not present

## 2023-08-25 DIAGNOSIS — J45909 Unspecified asthma, uncomplicated: Secondary | ICD-10-CM | POA: Diagnosis not present

## 2023-08-25 DIAGNOSIS — G311 Senile degeneration of brain, not elsewhere classified: Secondary | ICD-10-CM | POA: Diagnosis not present

## 2023-08-26 DIAGNOSIS — J449 Chronic obstructive pulmonary disease, unspecified: Secondary | ICD-10-CM | POA: Diagnosis not present

## 2023-08-26 DIAGNOSIS — I2510 Atherosclerotic heart disease of native coronary artery without angina pectoris: Secondary | ICD-10-CM | POA: Diagnosis not present

## 2023-08-26 DIAGNOSIS — G311 Senile degeneration of brain, not elsewhere classified: Secondary | ICD-10-CM | POA: Diagnosis not present

## 2023-08-26 DIAGNOSIS — J45909 Unspecified asthma, uncomplicated: Secondary | ICD-10-CM | POA: Diagnosis not present

## 2023-08-26 DIAGNOSIS — D249 Benign neoplasm of unspecified breast: Secondary | ICD-10-CM | POA: Diagnosis not present

## 2023-08-26 DIAGNOSIS — F419 Anxiety disorder, unspecified: Secondary | ICD-10-CM | POA: Diagnosis not present

## 2023-08-27 DIAGNOSIS — G311 Senile degeneration of brain, not elsewhere classified: Secondary | ICD-10-CM | POA: Diagnosis not present

## 2023-08-27 DIAGNOSIS — D249 Benign neoplasm of unspecified breast: Secondary | ICD-10-CM | POA: Diagnosis not present

## 2023-08-27 DIAGNOSIS — F419 Anxiety disorder, unspecified: Secondary | ICD-10-CM | POA: Diagnosis not present

## 2023-08-27 DIAGNOSIS — J45909 Unspecified asthma, uncomplicated: Secondary | ICD-10-CM | POA: Diagnosis not present

## 2023-08-27 DIAGNOSIS — I2510 Atherosclerotic heart disease of native coronary artery without angina pectoris: Secondary | ICD-10-CM | POA: Diagnosis not present

## 2023-08-27 DIAGNOSIS — J449 Chronic obstructive pulmonary disease, unspecified: Secondary | ICD-10-CM | POA: Diagnosis not present

## 2023-09-24 DEATH — deceased
